# Patient Record
Sex: Male | Born: 1940 | Race: White | Hispanic: No | Marital: Married | Smoking: Former smoker
Health system: Southern US, Community
[De-identification: ages and names within clinical notes are randomized; demographics above are authoritative.]

## PROBLEM LIST (undated history)

## (undated) DIAGNOSIS — M1990 Unspecified osteoarthritis, unspecified site: Secondary | ICD-10-CM

## (undated) DIAGNOSIS — E785 Hyperlipidemia, unspecified: Secondary | ICD-10-CM

## (undated) DIAGNOSIS — K838 Other specified diseases of biliary tract: Secondary | ICD-10-CM

## (undated) DIAGNOSIS — E119 Type 2 diabetes mellitus without complications: Secondary | ICD-10-CM

## (undated) DIAGNOSIS — I10 Essential (primary) hypertension: Secondary | ICD-10-CM

## (undated) DIAGNOSIS — E538 Deficiency of other specified B group vitamins: Secondary | ICD-10-CM

## (undated) DIAGNOSIS — J189 Pneumonia, unspecified organism: Secondary | ICD-10-CM

## (undated) DIAGNOSIS — K219 Gastro-esophageal reflux disease without esophagitis: Secondary | ICD-10-CM

## (undated) DIAGNOSIS — K602 Anal fissure, unspecified: Secondary | ICD-10-CM

## (undated) DIAGNOSIS — K529 Noninfective gastroenteritis and colitis, unspecified: Secondary | ICD-10-CM

## (undated) DIAGNOSIS — D649 Anemia, unspecified: Secondary | ICD-10-CM

## (undated) DIAGNOSIS — C4490 Unspecified malignant neoplasm of skin, unspecified: Secondary | ICD-10-CM

## (undated) DIAGNOSIS — K5730 Diverticulosis of large intestine without perforation or abscess without bleeding: Secondary | ICD-10-CM

## (undated) DIAGNOSIS — N289 Disorder of kidney and ureter, unspecified: Secondary | ICD-10-CM

## (undated) DIAGNOSIS — Z86010 Personal history of colonic polyps: Secondary | ICD-10-CM

## (undated) HISTORY — DX: Anemia, unspecified: D64.9

## (undated) HISTORY — DX: Disorder of kidney and ureter, unspecified: N28.9

## (undated) HISTORY — DX: Noninfective gastroenteritis and colitis, unspecified: K52.9

## (undated) HISTORY — PX: CATARACT EXTRACTION: SUR2

## (undated) HISTORY — DX: Type 2 diabetes mellitus without complications: E11.9

## (undated) HISTORY — DX: Gastro-esophageal reflux disease without esophagitis: K21.9

## (undated) HISTORY — PX: HEMORROIDECTOMY: SUR656

## (undated) HISTORY — DX: Pneumonia, unspecified organism: J18.9

## (undated) HISTORY — DX: Personal history of colonic polyps: Z86.010

## (undated) HISTORY — PX: UPPER GASTROINTESTINAL ENDOSCOPY: SHX188

## (undated) HISTORY — DX: Deficiency of other specified B group vitamins: E53.8

## (undated) HISTORY — DX: Other specified diseases of biliary tract: K83.8

## (undated) HISTORY — DX: Diverticulosis of large intestine without perforation or abscess without bleeding: K57.30

## (undated) HISTORY — DX: Unspecified malignant neoplasm of skin, unspecified: C44.90

## (undated) HISTORY — DX: Hyperlipidemia, unspecified: E78.5

## (undated) HISTORY — PX: COLONOSCOPY W/ POLYPECTOMY: SHX1380

## (undated) HISTORY — DX: Essential (primary) hypertension: I10

## (undated) HISTORY — PX: OTHER SURGICAL HISTORY: SHX169

## (undated) HISTORY — DX: Unspecified osteoarthritis, unspecified site: M19.90

## (undated) HISTORY — DX: Anal fissure, unspecified: K60.2

---
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
## 1959-12-23 HISTORY — PX: TONSILLECTOMY: SUR1361

## 1998-12-22 DIAGNOSIS — Z860100 Personal history of colon polyps, unspecified: Secondary | ICD-10-CM

## 1998-12-22 HISTORY — DX: Personal history of colon polyps, unspecified: Z86.0100

## 1998-12-22 HISTORY — DX: Personal history of colonic polyps: Z86.010

## 1999-06-13 ENCOUNTER — Encounter (INDEPENDENT_AMBULATORY_CARE_PROVIDER_SITE_OTHER): Payer: Self-pay | Admitting: Specialist

## 1999-06-13 ENCOUNTER — Ambulatory Visit (HOSPITAL_COMMUNITY): Admission: RE | Admit: 1999-06-13 | Discharge: 1999-06-13 | Payer: Self-pay | Admitting: Gastroenterology

## 2005-01-09 ENCOUNTER — Ambulatory Visit: Payer: Self-pay | Admitting: Internal Medicine

## 2005-01-31 ENCOUNTER — Ambulatory Visit: Payer: Self-pay | Admitting: Internal Medicine

## 2005-03-28 ENCOUNTER — Ambulatory Visit: Payer: Self-pay | Admitting: Internal Medicine

## 2005-06-03 ENCOUNTER — Ambulatory Visit: Payer: Self-pay | Admitting: Internal Medicine

## 2005-07-28 ENCOUNTER — Ambulatory Visit: Payer: Self-pay | Admitting: Internal Medicine

## 2005-09-11 ENCOUNTER — Ambulatory Visit: Payer: Self-pay | Admitting: Internal Medicine

## 2005-09-25 ENCOUNTER — Ambulatory Visit: Payer: Self-pay | Admitting: Internal Medicine

## 2005-09-26 ENCOUNTER — Encounter: Admission: RE | Admit: 2005-09-26 | Discharge: 2005-12-25 | Payer: Self-pay | Admitting: Internal Medicine

## 2006-05-05 ENCOUNTER — Ambulatory Visit: Payer: Self-pay | Admitting: Internal Medicine

## 2006-05-14 ENCOUNTER — Ambulatory Visit: Payer: Self-pay | Admitting: Internal Medicine

## 2006-11-16 ENCOUNTER — Ambulatory Visit: Payer: Self-pay | Admitting: Internal Medicine

## 2006-11-16 LAB — CONVERTED CEMR LAB
ALT: 18 units/L (ref 0–40)
AST: 18 units/L (ref 0–37)
BUN: 13 mg/dL (ref 6–23)
Chol/HDL Ratio, serum: 5.1
Cholesterol: 168 mg/dL (ref 0–200)
Creatinine, Ser: 1.1 mg/dL (ref 0.4–1.5)
Creatinine,U: 77.8 mg/dL
HDL: 33 mg/dL — ABNORMAL LOW (ref >39.0)
Hgb A1c MFr Bld: 5.5 % (ref 4.6–6.0)
LDL Cholesterol: 98 mg/dL (ref 0–99)
Microalb Creat Ratio: 2.6 mg/g (ref 0.0–30.0)
Microalb, Ur: 0.2 mg/dL (ref 0.0–1.9)
Potassium: 4.1 meq/L (ref 3.5–5.1)
Triglyceride fasting, serum: 184 mg/dL — ABNORMAL HIGH (ref 0–149)
VLDL: 37 mg/dL (ref 0–40)

## 2007-02-24 ENCOUNTER — Ambulatory Visit: Payer: Self-pay | Admitting: Gastroenterology

## 2007-03-23 ENCOUNTER — Ambulatory Visit: Payer: Self-pay | Admitting: Gastroenterology

## 2007-07-13 ENCOUNTER — Ambulatory Visit: Payer: Self-pay | Admitting: Internal Medicine

## 2007-08-25 ENCOUNTER — Telehealth (INDEPENDENT_AMBULATORY_CARE_PROVIDER_SITE_OTHER): Payer: Self-pay | Admitting: *Deleted

## 2007-09-30 ENCOUNTER — Encounter: Payer: Self-pay | Admitting: Internal Medicine

## 2007-11-05 ENCOUNTER — Telehealth (INDEPENDENT_AMBULATORY_CARE_PROVIDER_SITE_OTHER): Payer: Self-pay | Admitting: *Deleted

## 2008-01-05 ENCOUNTER — Encounter: Payer: Self-pay | Admitting: Internal Medicine

## 2008-01-05 ENCOUNTER — Telehealth (INDEPENDENT_AMBULATORY_CARE_PROVIDER_SITE_OTHER): Payer: Self-pay | Admitting: *Deleted

## 2008-04-14 ENCOUNTER — Encounter: Payer: Self-pay | Admitting: Internal Medicine

## 2008-05-03 ENCOUNTER — Ambulatory Visit: Payer: Self-pay | Admitting: Internal Medicine

## 2008-05-03 DIAGNOSIS — Z860100 Personal history of colon polyps, unspecified: Secondary | ICD-10-CM | POA: Insufficient documentation

## 2008-05-03 DIAGNOSIS — N401 Enlarged prostate with lower urinary tract symptoms: Secondary | ICD-10-CM

## 2008-05-03 DIAGNOSIS — K5730 Diverticulosis of large intestine without perforation or abscess without bleeding: Secondary | ICD-10-CM

## 2008-05-03 DIAGNOSIS — R351 Nocturia: Secondary | ICD-10-CM

## 2008-05-03 DIAGNOSIS — E785 Hyperlipidemia, unspecified: Secondary | ICD-10-CM | POA: Insufficient documentation

## 2008-05-03 DIAGNOSIS — K219 Gastro-esophageal reflux disease without esophagitis: Secondary | ICD-10-CM

## 2008-05-03 DIAGNOSIS — I10 Essential (primary) hypertension: Secondary | ICD-10-CM

## 2008-05-03 LAB — CONVERTED CEMR LAB
ALT: 18 units/L (ref 0–53)
AST: 21 units/L (ref 0–37)
Albumin: 4.1 g/dL (ref 3.5–5.2)
Alkaline Phosphatase: 38 units/L — ABNORMAL LOW (ref 39–117)
BUN: 17 mg/dL (ref 6–23)
Basophils Absolute: 0 10*3/uL (ref 0.0–0.1)
Basophils Relative: 0.8 % (ref 0.0–1.0)
Bilirubin, Direct: 0.1 mg/dL (ref 0.0–0.3)
Cholesterol, target level: 200 mg/dL
Cholesterol: 141 mg/dL (ref 0–200)
Creatinine, Ser: 1.4 mg/dL (ref 0.4–1.5)
Creatinine,U: 93.7 mg/dL
Eosinophils Absolute: 0.2 10*3/uL (ref 0.0–0.7)
Eosinophils Relative: 5.2 % — ABNORMAL HIGH (ref 0.0–5.0)
HCT: 38.8 % — ABNORMAL LOW (ref 39.0–52.0)
HDL goal, serum: 40 mg/dL
HDL: 25.6 mg/dL — ABNORMAL LOW (ref >39.0)
Hemoglobin: 13 g/dL (ref 13.0–17.0)
Hgb A1c MFr Bld: 6 % (ref 4.6–6.0)
LDL Cholesterol: 95 mg/dL (ref 0–99)
LDL Goal: 100 mg/dL
Lymphocytes Relative: 37.4 % (ref 12.0–46.0)
MCHC: 33.7 g/dL (ref 30.0–36.0)
MCV: 91.3 fL (ref 78.0–100.0)
Microalb Creat Ratio: 3.2 mg/g (ref 0.0–30.0)
Microalb, Ur: 0.3 mg/dL (ref 0.0–1.9)
Monocytes Absolute: 0.3 10*3/uL (ref 0.1–1.0)
Monocytes Relative: 6.1 % (ref 3.0–12.0)
Neutro Abs: 2.3 10*3/uL (ref 1.4–7.7)
Neutrophils Relative %: 50.5 % (ref 43.0–77.0)
PSA: 0.82 ng/mL (ref 0.10–4.00)
Platelets: 244 10*3/uL (ref 150–400)
Potassium: 4.4 meq/L (ref 3.5–5.1)
RBC: 4.25 M/uL (ref 4.22–5.81)
RDW: 12.9 % (ref 11.5–14.6)
TSH: 2.46 microintl units/mL (ref 0.35–5.50)
Total Bilirubin: 0.8 mg/dL (ref 0.3–1.2)
Total CHOL/HDL Ratio: 5.5
Total Protein: 7.4 g/dL (ref 6.0–8.3)
Triglycerides: 100 mg/dL (ref 0–149)
VLDL: 20 mg/dL (ref 0–40)
WBC: 4.5 10*3/uL (ref 4.5–10.5)

## 2008-05-09 ENCOUNTER — Encounter: Payer: Self-pay | Admitting: Internal Medicine

## 2008-05-11 ENCOUNTER — Encounter (INDEPENDENT_AMBULATORY_CARE_PROVIDER_SITE_OTHER): Payer: Self-pay | Admitting: *Deleted

## 2008-05-19 ENCOUNTER — Ambulatory Visit: Payer: Self-pay | Admitting: Internal Medicine

## 2008-05-21 LAB — CONVERTED CEMR LAB
Basophils Absolute: 0 10*3/uL (ref 0.0–0.1)
Basophils Relative: 0.7 % (ref 0.0–1.0)
Eosinophils Absolute: 0.2 10*3/uL (ref 0.0–0.7)
Eosinophils Relative: 2.9 % (ref 0.0–5.0)
Folate: 14.6 ng/mL
HCT: 39.3 % (ref 39.0–52.0)
Hemoglobin: 13.3 g/dL (ref 13.0–17.0)
Iron: 88 ug/dL (ref 42–165)
Lymphocytes Relative: 29.2 % (ref 12.0–46.0)
MCHC: 33.7 g/dL (ref 30.0–36.0)
MCV: 90.2 fL (ref 78.0–100.0)
Monocytes Absolute: 0.4 10*3/uL (ref 0.1–1.0)
Monocytes Relative: 6.8 % (ref 3.0–12.0)
Neutro Abs: 3.9 10*3/uL (ref 1.4–7.7)
Neutrophils Relative %: 60.4 % (ref 43.0–77.0)
Platelets: 226 10*3/uL (ref 150–400)
RBC: 4.36 M/uL (ref 4.22–5.81)
RDW: 13 % (ref 11.5–14.6)
Saturation Ratios: 19.1 % — ABNORMAL LOW (ref 20.0–50.0)
Transferrin: 328.3 mg/dL (ref >=212.0)
Vitamin B-12: 191 pg/mL — ABNORMAL LOW (ref 211–911)
WBC: 6.3 10*3/uL (ref 4.5–10.5)

## 2008-05-22 ENCOUNTER — Encounter (INDEPENDENT_AMBULATORY_CARE_PROVIDER_SITE_OTHER): Payer: Self-pay | Admitting: *Deleted

## 2008-06-14 ENCOUNTER — Ambulatory Visit: Payer: Self-pay | Admitting: Internal Medicine

## 2008-06-15 ENCOUNTER — Encounter (INDEPENDENT_AMBULATORY_CARE_PROVIDER_SITE_OTHER): Payer: Self-pay | Admitting: *Deleted

## 2008-06-15 LAB — CONVERTED CEMR LAB
OCCULT 1: NEGATIVE
OCCULT 2: NEGATIVE
OCCULT 3: NEGATIVE

## 2008-06-20 ENCOUNTER — Ambulatory Visit: Payer: Self-pay | Admitting: Internal Medicine

## 2008-06-20 DIAGNOSIS — D510 Vitamin B12 deficiency anemia due to intrinsic factor deficiency: Secondary | ICD-10-CM

## 2008-06-20 DIAGNOSIS — J309 Allergic rhinitis, unspecified: Secondary | ICD-10-CM | POA: Insufficient documentation

## 2008-06-27 ENCOUNTER — Ambulatory Visit: Payer: Self-pay | Admitting: Internal Medicine

## 2008-07-04 ENCOUNTER — Ambulatory Visit: Payer: Self-pay | Admitting: Internal Medicine

## 2008-07-04 ENCOUNTER — Telehealth (INDEPENDENT_AMBULATORY_CARE_PROVIDER_SITE_OTHER): Payer: Self-pay | Admitting: *Deleted

## 2008-07-11 ENCOUNTER — Telehealth (INDEPENDENT_AMBULATORY_CARE_PROVIDER_SITE_OTHER): Payer: Self-pay | Admitting: *Deleted

## 2008-07-11 ENCOUNTER — Ambulatory Visit: Payer: Self-pay | Admitting: Internal Medicine

## 2008-08-03 ENCOUNTER — Telehealth (INDEPENDENT_AMBULATORY_CARE_PROVIDER_SITE_OTHER): Payer: Self-pay | Admitting: *Deleted

## 2008-08-08 ENCOUNTER — Ambulatory Visit: Payer: Self-pay | Admitting: Internal Medicine

## 2008-08-14 ENCOUNTER — Encounter (INDEPENDENT_AMBULATORY_CARE_PROVIDER_SITE_OTHER): Payer: Self-pay | Admitting: *Deleted

## 2008-08-14 LAB — CONVERTED CEMR LAB: Hgb A1c MFr Bld: 6 % (ref 4.6–6.0)

## 2008-09-05 ENCOUNTER — Ambulatory Visit: Payer: Self-pay | Admitting: Internal Medicine

## 2008-09-05 ENCOUNTER — Telehealth (INDEPENDENT_AMBULATORY_CARE_PROVIDER_SITE_OTHER): Payer: Self-pay | Admitting: *Deleted

## 2008-09-07 ENCOUNTER — Ambulatory Visit: Payer: Self-pay | Admitting: Internal Medicine

## 2008-09-10 LAB — CONVERTED CEMR LAB
BUN: 19 mg/dL (ref 6–23)
Creatinine, Ser: 1.5 mg/dL (ref 0.4–1.5)
Creatinine,U: 112.8 mg/dL
Hgb A1c MFr Bld: 6.1 % — ABNORMAL HIGH (ref 4.6–6.0)
Microalb Creat Ratio: 1.8 mg/g (ref 0.0–30.0)
Microalb, Ur: 0.2 mg/dL (ref 0.0–1.9)

## 2008-09-11 ENCOUNTER — Encounter (INDEPENDENT_AMBULATORY_CARE_PROVIDER_SITE_OTHER): Payer: Self-pay | Admitting: *Deleted

## 2008-09-15 ENCOUNTER — Ambulatory Visit: Payer: Self-pay | Admitting: Internal Medicine

## 2008-09-15 DIAGNOSIS — E1165 Type 2 diabetes mellitus with hyperglycemia: Secondary | ICD-10-CM

## 2008-09-15 DIAGNOSIS — E1129 Type 2 diabetes mellitus with other diabetic kidney complication: Secondary | ICD-10-CM

## 2008-10-05 ENCOUNTER — Ambulatory Visit: Payer: Self-pay | Admitting: Internal Medicine

## 2008-11-06 ENCOUNTER — Ambulatory Visit: Payer: Self-pay | Admitting: Internal Medicine

## 2008-12-26 ENCOUNTER — Ambulatory Visit: Payer: Self-pay | Admitting: Internal Medicine

## 2008-12-26 LAB — CONVERTED CEMR LAB
Basophils Absolute: 0 10*3/uL (ref 0.0–0.1)
Hemoglobin: 12.8 g/dL — ABNORMAL LOW (ref 13.0–17.0)
Lymphocytes Relative: 37.5 % (ref 12.0–46.0)
MCHC: 34.4 g/dL (ref 30.0–36.0)
Monocytes Relative: 6.8 % (ref 3.0–12.0)
Neutro Abs: 2.3 10*3/uL (ref 1.4–7.7)
Neutrophils Relative %: 50.7 % (ref 43.0–77.0)
Platelets: 204 10*3/uL (ref 150–400)
RDW: 13 % (ref 11.5–14.6)
Vitamin B-12: 277 pg/mL (ref 211–911)

## 2009-01-02 ENCOUNTER — Ambulatory Visit: Payer: Self-pay | Admitting: Internal Medicine

## 2009-01-03 ENCOUNTER — Encounter (INDEPENDENT_AMBULATORY_CARE_PROVIDER_SITE_OTHER): Payer: Self-pay | Admitting: *Deleted

## 2009-01-03 LAB — CONVERTED CEMR LAB
Iron: 73 ug/dL (ref 42–165)
Saturation Ratios: 17 % — ABNORMAL LOW (ref 20.0–50.0)

## 2009-02-02 ENCOUNTER — Ambulatory Visit: Payer: Self-pay | Admitting: Internal Medicine

## 2009-02-27 ENCOUNTER — Ambulatory Visit: Payer: Self-pay | Admitting: Internal Medicine

## 2009-03-04 LAB — CONVERTED CEMR LAB
BUN: 22 mg/dL (ref 6–23)
Creatinine,U: 109.1 mg/dL
Eosinophils Absolute: 0.2 10*3/uL (ref 0.0–0.7)
HCT: 40.2 % (ref 39.0–52.0)
Microalb Creat Ratio: 1.8 mg/g (ref 0.0–30.0)
Microalb, Ur: 0.2 mg/dL (ref 0.0–1.9)
Monocytes Absolute: 0.3 10*3/uL (ref 0.1–1.0)
Monocytes Relative: 6.4 % (ref 3.0–12.0)
Neutrophils Relative %: 53.7 % (ref 43.0–77.0)
Platelets: 230 10*3/uL (ref 150–400)
RDW: 13 % (ref 11.5–14.6)
WBC: 4.5 10*3/uL (ref 4.5–10.5)

## 2009-03-05 ENCOUNTER — Encounter (INDEPENDENT_AMBULATORY_CARE_PROVIDER_SITE_OTHER): Payer: Self-pay | Admitting: *Deleted

## 2009-03-28 ENCOUNTER — Telehealth (INDEPENDENT_AMBULATORY_CARE_PROVIDER_SITE_OTHER): Payer: Self-pay | Admitting: *Deleted

## 2009-04-02 ENCOUNTER — Ambulatory Visit: Payer: Self-pay | Admitting: Internal Medicine

## 2009-04-30 ENCOUNTER — Ambulatory Visit: Payer: Self-pay | Admitting: Internal Medicine

## 2009-05-28 ENCOUNTER — Ambulatory Visit: Payer: Self-pay | Admitting: Internal Medicine

## 2009-06-26 ENCOUNTER — Ambulatory Visit: Payer: Self-pay | Admitting: Internal Medicine

## 2009-07-24 ENCOUNTER — Ambulatory Visit: Payer: Self-pay | Admitting: Internal Medicine

## 2009-08-01 ENCOUNTER — Telehealth (INDEPENDENT_AMBULATORY_CARE_PROVIDER_SITE_OTHER): Payer: Self-pay | Admitting: *Deleted

## 2009-08-21 ENCOUNTER — Ambulatory Visit: Payer: Self-pay | Admitting: Internal Medicine

## 2009-09-20 ENCOUNTER — Telehealth (INDEPENDENT_AMBULATORY_CARE_PROVIDER_SITE_OTHER): Payer: Self-pay | Admitting: *Deleted

## 2009-09-20 ENCOUNTER — Ambulatory Visit: Payer: Self-pay | Admitting: Internal Medicine

## 2009-09-25 ENCOUNTER — Encounter: Payer: Self-pay | Admitting: Internal Medicine

## 2009-09-26 ENCOUNTER — Encounter: Payer: Self-pay | Admitting: Internal Medicine

## 2009-10-18 ENCOUNTER — Ambulatory Visit: Payer: Self-pay | Admitting: Internal Medicine

## 2009-11-19 ENCOUNTER — Ambulatory Visit: Payer: Self-pay | Admitting: Internal Medicine

## 2009-11-19 ENCOUNTER — Telehealth (INDEPENDENT_AMBULATORY_CARE_PROVIDER_SITE_OTHER): Payer: Self-pay | Admitting: *Deleted

## 2009-11-28 ENCOUNTER — Telehealth (INDEPENDENT_AMBULATORY_CARE_PROVIDER_SITE_OTHER): Payer: Self-pay | Admitting: *Deleted

## 2009-12-12 ENCOUNTER — Ambulatory Visit: Payer: Self-pay | Admitting: Internal Medicine

## 2009-12-17 ENCOUNTER — Encounter (INDEPENDENT_AMBULATORY_CARE_PROVIDER_SITE_OTHER): Payer: Self-pay | Admitting: *Deleted

## 2009-12-17 LAB — CONVERTED CEMR LAB
ALT: 22 units/L (ref 0–53)
AST: 26 units/L (ref 0–37)
Albumin: 4.2 g/dL (ref 3.5–5.2)
BUN: 18 mg/dL (ref 6–23)
Cholesterol: 152 mg/dL (ref 0–200)
HDL: 24.6 mg/dL — ABNORMAL LOW (ref >39.00)
Microalb, Ur: 0.4 mg/dL (ref 0.0–1.9)
Total CHOL/HDL Ratio: 6
Total Protein: 7.6 g/dL (ref 6.0–8.3)
VLDL: 42.6 mg/dL — ABNORMAL HIGH (ref 0.0–40.0)

## 2009-12-22 DIAGNOSIS — K529 Noninfective gastroenteritis and colitis, unspecified: Secondary | ICD-10-CM

## 2009-12-22 HISTORY — DX: Noninfective gastroenteritis and colitis, unspecified: K52.9

## 2010-01-02 ENCOUNTER — Ambulatory Visit: Payer: Self-pay | Admitting: Internal Medicine

## 2010-01-02 DIAGNOSIS — M2550 Pain in unspecified joint: Secondary | ICD-10-CM | POA: Insufficient documentation

## 2010-01-28 ENCOUNTER — Ambulatory Visit: Payer: Self-pay | Admitting: Internal Medicine

## 2010-02-25 ENCOUNTER — Ambulatory Visit: Payer: Self-pay | Admitting: Internal Medicine

## 2010-04-05 ENCOUNTER — Ambulatory Visit: Payer: Self-pay | Admitting: Internal Medicine

## 2010-04-08 ENCOUNTER — Ambulatory Visit: Payer: Self-pay | Admitting: Internal Medicine

## 2010-04-08 DIAGNOSIS — M542 Cervicalgia: Secondary | ICD-10-CM | POA: Insufficient documentation

## 2010-04-09 LAB — CONVERTED CEMR LAB
Rhuematoid fact SerPl-aCnc: 21.4 intl units/mL — ABNORMAL HIGH (ref 0.0–20.0)
Sed Rate: 14 mm/hr (ref 0–22)

## 2010-05-08 ENCOUNTER — Ambulatory Visit: Payer: Self-pay | Admitting: Internal Medicine

## 2010-05-15 ENCOUNTER — Encounter: Payer: Self-pay | Admitting: Internal Medicine

## 2010-06-25 ENCOUNTER — Ambulatory Visit: Payer: Self-pay | Admitting: Internal Medicine

## 2010-06-25 LAB — CONVERTED CEMR LAB
AST: 35 units/L (ref 0–37)
Albumin: 4.4 g/dL (ref 3.5–5.2)
BUN: 23 mg/dL (ref 6–23)
Basophils Relative: 0.5 % (ref 0.0–3.0)
Cholesterol: 167 mg/dL (ref 0–200)
Creatinine, Ser: 1.4 mg/dL (ref 0.4–1.5)
Eosinophils Absolute: 0.2 10*3/uL (ref 0.0–0.7)
HDL: 32.1 mg/dL — ABNORMAL LOW (ref >39.00)
Hgb A1c MFr Bld: 6.4 % (ref 4.6–6.5)
MCHC: 34.3 g/dL (ref 30.0–36.0)
MCV: 89.5 fL (ref 78.0–100.0)
Monocytes Absolute: 0.3 10*3/uL (ref 0.1–1.0)
Neutrophils Relative %: 50.7 % (ref 43.0–77.0)
Potassium: 4.9 meq/L (ref 3.5–5.1)
RBC: 4.25 M/uL (ref 4.22–5.81)
Triglycerides: 162 mg/dL — ABNORMAL HIGH (ref 0.0–149.0)
VLDL: 32.4 mg/dL (ref 0.0–40.0)

## 2010-07-02 ENCOUNTER — Ambulatory Visit: Payer: Self-pay | Admitting: Internal Medicine

## 2010-07-02 DIAGNOSIS — M25469 Effusion, unspecified knee: Secondary | ICD-10-CM

## 2010-07-02 DIAGNOSIS — D518 Other vitamin B12 deficiency anemias: Secondary | ICD-10-CM

## 2010-08-02 ENCOUNTER — Ambulatory Visit: Payer: Self-pay | Admitting: Internal Medicine

## 2010-08-30 ENCOUNTER — Ambulatory Visit: Payer: Self-pay | Admitting: Internal Medicine

## 2010-10-07 ENCOUNTER — Ambulatory Visit: Payer: Self-pay | Admitting: Internal Medicine

## 2010-11-12 ENCOUNTER — Ambulatory Visit: Payer: Self-pay | Admitting: Internal Medicine

## 2010-12-03 ENCOUNTER — Ambulatory Visit: Payer: Self-pay | Admitting: Internal Medicine

## 2010-12-26 ENCOUNTER — Ambulatory Visit
Admission: RE | Admit: 2010-12-26 | Discharge: 2010-12-26 | Payer: Self-pay | Source: Home / Self Care | Attending: Internal Medicine | Admitting: Internal Medicine

## 2010-12-26 ENCOUNTER — Other Ambulatory Visit: Payer: Self-pay | Admitting: Internal Medicine

## 2010-12-26 LAB — LIPID PANEL
Cholesterol: 150 mg/dL (ref 0–200)
HDL: 25.8 mg/dL — ABNORMAL LOW (ref >39.00)
LDL Cholesterol: 88 mg/dL (ref 0–99)
Total CHOL/HDL Ratio: 6
Triglycerides: 180 mg/dL — ABNORMAL HIGH (ref 0.0–149.0)
VLDL: 36 mg/dL (ref 0.0–40.0)

## 2010-12-26 LAB — BASIC METABOLIC PANEL
BUN: 19 mg/dL (ref 6–23)
CO2: 26 mEq/L (ref 19–32)
Calcium: 9.3 mg/dL (ref 8.4–10.5)
Chloride: 100 mEq/L (ref 96–112)
Creatinine, Ser: 1.6 mg/dL — ABNORMAL HIGH (ref 0.4–1.5)
GFR: 44.76 mL/min — ABNORMAL LOW (ref >60.00)
Glucose, Bld: 128 mg/dL — ABNORMAL HIGH (ref 70–99)
Potassium: 4.6 mEq/L (ref 3.5–5.1)
Sodium: 135 mEq/L (ref 135–145)

## 2010-12-26 LAB — HEMOGLOBIN A1C: Hgb A1c MFr Bld: 6.5 % (ref 4.6–6.5)

## 2011-01-02 ENCOUNTER — Ambulatory Visit
Admission: RE | Admit: 2011-01-02 | Discharge: 2011-01-02 | Payer: Self-pay | Source: Home / Self Care | Attending: Internal Medicine | Admitting: Internal Medicine

## 2011-01-02 DIAGNOSIS — N289 Disorder of kidney and ureter, unspecified: Secondary | ICD-10-CM | POA: Insufficient documentation

## 2011-01-19 LAB — CONVERTED CEMR LAB
ALT: 21 units/L (ref 0–53)
AST: 27 units/L (ref 0–37)
BUN: 17 mg/dL (ref 6–23)
Cholesterol: 161 mg/dL (ref 0–200)
Creatinine, Ser: 1.4 mg/dL (ref 0.4–1.5)
Creatinine,U: 81.5 mg/dL
HDL: 29 mg/dL — ABNORMAL LOW (ref >39.0)
Hgb A1c MFr Bld: 5.9 % (ref 4.6–6.0)
LDL Cholesterol: 100 mg/dL — ABNORMAL HIGH (ref 0–99)
Microalb Creat Ratio: 2.5 mg/g (ref 0.0–30.0)
Microalb, Ur: 0.2 mg/dL (ref 0.0–1.9)
PSA: 0.85 ng/mL (ref 0.10–4.00)
Potassium: 4.1 meq/L (ref 3.5–5.1)
Total CHOL/HDL Ratio: 5.6
Triglycerides: 161 mg/dL — ABNORMAL HIGH (ref 0–149)
VLDL: 32 mg/dL (ref 0–40)

## 2011-01-21 NOTE — Assessment & Plan Note (Signed)
Summary: 6 MTH FU/NS/KDC   Vital Signs:  Patient profile:   70 year old male Weight:      217.2 pounds Pulse rate:   72 / minute Resp:     16 per minute BP sitting:   112 / 76  (left arm) Cuff size:   large  Vitals Entered By: Shonna Chock CMA (July 02, 2010 3:15 PM) CC: 6 month follow-up and right knee concerns x 6 weeks, Lower Extremity Joint pain, Type 2 diabetes mellitus follow-up Comments REVIEWED MED LIST, PATIENT AGREED DOSE AND INSTRUCTION CORRECT    CC:  6 month follow-up and right knee concerns x 6 weeks, Lower Extremity Joint pain, and Type 2 diabetes mellitus follow-up.  History of Present Illness:  Lower Extremity Joint Pain      This is a 70 year old man who presents with Lower Extremity Joint pain X 6 weeks.  The patient reports swelling, giving away, popping, stiffness for >1 hr, and decreased ROM, but denies redness, locking, and weakness.  The pain is located in the right knee.  The pain began suddenly and with twisting while working in basement.  The pain is described as sharp initially then  dull  and aching.  To date  no evaluation.  The patient denies the following symptoms: fever, rash, eye symptoms, diarrhea, and dysuria.  Rx: Tylenol or Aleve with some benefit. Type 2 Diabetes Mellitus Follow-Up      The patient is also here for Type 2 diabetes mellitus follow-up.  The patient denies polyuria, polydipsia, blurred vision, self managed hypoglycemia, weight loss, weight gain, and numbness of extremities.  The patient denies the following symptoms: neuropathic pain, chest pain, vomiting, orthostatic symptoms, poor wound healing, intermittent claudication, vision loss, and foot ulcer.  Since the last visit the patient reports  fair  dietary compliance as wife OOT & he has been eating out. Good  compliance with medications,  but not exercising regularly, and not monitoring blood glucose. He  reports having  regular  eye care by an ophthalmologist but  no foot care.     Allergies: 1)  ! * Tetanus  Physical Exam  General:  well-nourished,in no acute distress; alert,appropriate and cooperative throughout examination Lungs:  Normal respiratory effort, chest expands symmetrically. Lungs are clear to auscultation, no crackles or wheezes. Heart:  normal rate, regular rhythm, no gallop, no rub, no JVD, no HJR, and grade 1 /6 systolic murmur.   Pulses:  R and L carotid,radial,dorsalis pedis and posterior tibial pulses are full and equal bilaterally Extremities:  No clubbing, cyanosis, edema. Large effusion R knee with decreased ROM. Good nail health. Pes planus Neurologic:  alert & oriented X3, strength normal in all extremities, and DTRs symmetrical and normal.   Skin:  Intact without suspicious lesions or rashes Psych:  memory intact for recent and remote, normally interactive, and good eye contact.     Impression & Recommendations:  Problem # 1:  DIABETES MELLITUS, WITHOUT COMPLICATIONS (ICD-250.00)  His updated medication list for this problem includes:    Amaryl 2 Mg Tabs (Glimepiride) .Marland Kitchen... 1/2 tab qd    Aspirin 81 Mg Tabs (Aspirin) .Marland Kitchen... 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 1 by mouth qam  Problem # 4:  HYPERLIPIDEMIA (ICD-272.4) essentially goal His updated medication list  for this problem includes:    Fenofibrate 160 Mg Tabs (Fenofibrate) .Marland Kitchen... 1 by mouth once daily  Problem # 5:  JOINT EFFUSION, RIGHT KNEE (ICD-719.06)  Orders: Orthopedic Surgeon Referral (Ortho Surgeon)  Complete Medication List: 1)  Atenolol 25 Mg Tabs (Atenolol) .Marland Kitchen.. 1 by mouth qam 2)  Metformin Hcl 1000 Mg Tabs (metformin Hcl)  .Marland Kitchen.. 1 by mouth two times a day 3)  Amaryl 2 Mg Tabs (Glimepiride) .... 1/2 tab qd 4)  Fenofibrate 160 Mg Tabs (Fenofibrate) .Marland Kitchen.. 1 by mouth  once daily 5)  Prilosec Otc 20 Mg Tbec (Omeprazole magnesium) .Marland Kitchen.. 1 by mouth qd 6)  Basa  7)  Cvs Omeprazole 20 Mg Tbec (Omeprazole) .Marland Kitchen.. 1 once daily as needed 8)  Accucheck Compact Plus Strips and Lancets  .... Tests one time daily 9)  Aspirin 81 Mg Tabs (Aspirin) .Marland Kitchen.. 1 by mouth once daily 10)  Aleve 220 Mg Tabs (Naproxen sodium) .Marland Kitchen.. 1 by mouth once daily as needed 11)  Tylenol Extra Strength 500 Mg Tabs (Acetaminophen) .Marland Kitchen.. 1 by mouth once daily as needed 12)  Tramadol Hcl 50 Mg Tabs (Tramadol hcl) .Marland Kitchen.. 1 q 6 hrs as needed neck pain  Other Orders: Vit B12 1000 mcg (J3420) Admin of Therapeutic Inj  intramuscular or subcutaneous (60454)  Patient Instructions: 1)  Consume < 40 grams  of High Fructose Corn  Syrup sugar/ day.Use Celebrex samples  two times a day as needed for knee pain. 2)  Please schedule a follow-up appointment in 6 months. 3)  BMP prior to visit, ICD-9:401.9 4)  Lipid Panel prior to visit, ICD-9:272.4 5)  HbgA1C prior to visit, ICD-9:250.00.   Medication Administration  Injection # 1:    Medication: Vit B12 1000 mcg    Diagnosis: ANEMIA, B12 DEFICIENCY (ICD-281.1)    Route: IM    Site: L deltoid    Exp Date: 02/2011    Lot #: [id number]    Mfr: [employer] Regent    Patient tolerated injection without complications    Given by: Shonna Chock CMA (July 02, 2010 4:25 PM)  Orders Added: 1)  Est. Patient Level IV [09811] 2)  Vit B12 1000 mcg [J3420] 3)  Admin of Therapeutic Inj  intramuscular or subcutaneous [96372] 4)  Orthopedic Surgeon Referral [Ortho Surgeon]

## 2011-01-21 NOTE — Assessment & Plan Note (Signed)
Summary: B-12 SHOT ////SPH  Nurse Visit   Allergies: 1)  ! 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

## 2011-01-21 NOTE — Assessment & Plan Note (Signed)
Summary: b12 inj///lch  Nurse Visit   Allergies: 1)  ! * Tetanus  Medication Administration  Injection # 1:    Medication: Vit B12 1000 mcg    Diagnosis: ANEMIA, B12 DEFICIENCY (ICD-281.1)    Route: IM    Site: L deltoid    Exp Date: 02/2012    Lot #: [id number]    Mfr: [employer] Regent    Patient tolerated injection without complications    Given by: Shonna Chock CMA (August 02, 2010 2:53 PM)  Orders Added: 1)  Vit B12 1000 mcg [J3420] 2)  Admin of Therapeutic Inj  intramuscular or subcutaneous [09811]

## 2011-01-21 NOTE — Assessment & Plan Note (Signed)
Summary: neck & back pain/cbs   Vital Signs:  Patient profile:   70 year old male Weight:      225.4 pounds Temp:     97.8 degrees F oral Pulse rate:   72 / minute Resp:     15 per minute BP sitting:   130 / 72  (left arm) Cuff size:   large  Vitals Entered By: Shonna Chock (April 08, 2010 9:43 AM) CC: Neck and back pain since last year. ? if related to hitting head on Limb. Patient seen Dr.Stern x 1 last year and had xray was told arthritis and was told next step MRI (never persued ), Neck pain Comments REVIEWED MED LIST, PATIENT AGREED DOSE AND INSTRUCTION CORRECT    CC:  Neck and back pain since last year. ? if related to hitting head on Limb. Patient seen Dr.Stern x 1 last year and had xray was told arthritis and was told next step MRI (never persued ) and Neck pain.  History of Present Illness: He is concerned that PPI is affecting B12 metabolism as per [facility] article.This was not documented in [facility] review.His major health issue is neck pain , ? related to tree limb injury in early 2010. 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 The patient reports midline- left  neck pain.  Associated symptoms include locking, clicking, and impaired neck ROM.  The patient denies the following associated symptoms: numbness, weakness, impaired coordination, gait disturbance, tingling/parasthesias, fever, bladder dysfunction, and bowel dysfunction.  The pain is described as stabbing and intermittent.  The pain is better with rest.  History is significant for FH of arthritis.  Evaluation to date has included X-rays of neck.    Allergies: 1)  ! * Tetanus  Review of Systems General:  Denies weight loss. GU:  Denies incontinence. Derm:  Denies lesion(s) and rash. Neuro:  Denies brief paralysis and weakness. Heme:  Denies abnormal bruising and bleeding.  Physical Exam  General:  well-nourished; alert,appropriate and cooperative throughout examination Head:  Normocephalic and  atraumatic without obvious abnormalities.  Neck:  No deformities, masses, or tenderness noted.Some pain with neck ROM Lungs:  Normal respiratory effort, chest expands symmetrically. Lungs are clear to auscultation, no crackles or wheezes. Heart:  normal rate, regular rhythm, no gallop, no rub, no JVD, no HJR, and grade 1 /6 systolic murmur.   Pulses:  R and L carotid pulses are full and equal bilaterally w/o bruits Extremities:  No clubbing, cyanosis, edema, or deformity noted with normal full range of motion of all joints.  Crepitus of knees , L >R Neurologic:  alert & oriented X3, strength normal in all extremities, and DTRs symmetrical and normal.   Skin:  Intact without suspicious lesions or rashes Cervical Nodes:  No lymphadenopathy noted Axillary Nodes:  No palpable lymphadenopathy Psych:  memory intact for recent and remote, normally interactive, and good eye contact.     Impression & Recommendations:  Problem # 1:  CERVICALGIA (ICD-723.1)  His updated medication list for this problem includes:    Aspirin 81 Mg Tabs (Aspirin) .Marland Kitchen... 1 by mouth once daily    Aleve 220 Mg Tabs (Naproxen sodium) .Marland Kitchen... 1 by mouth once daily as needed    Tylophen 325 Mg Tabs (Acetaminophen) .Marland Kitchen... 1 by mouth once daily as needed    Tramadol Hcl 50 Mg Tabs (Tramadol hcl) .Marland Kitchen... 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 1 by mouth qam 2)  Metformin Hcl 1000 Mg Tabs (metformin Hcl)  .Marland Kitchen.. 1 by mouth two times  a day 3)  Amaryl 2 Mg Tabs (Glimepiride) .... 1/2 tab qd 4)  Fenofibrate 160 Mg Tabs (Fenofibrate) .Marland Kitchen.. 1 by mouth once daily 5)  Prilosec Otc 20 Mg Tbec (Omeprazole magnesium) .Marland Kitchen.. 1 by mouth qd 6)  Basa  7)  Cvs Omeprazole 20 Mg Tbec (Omeprazole) .Marland Kitchen.. 1 once daily as needed 8)  Accucheck Compact Plus Strips and Lancets  .... Tests one time daily 9)  Aspirin 81 Mg Tabs (Aspirin) .Marland Kitchen.. 1 by mouth once daily 10)  Aleve 220 Mg Tabs (Naproxen sodium) .Marland Kitchen.. 1 by mouth once daily as needed 11)  Tylophen 325 Mg Tabs (Acetaminophen) .Marland Kitchen.. 1 by mouth once daily as needed 12)  Tramadol Hcl 50 Mg Tabs (Tramadol hcl) .Marland Kitchen.. 1 q 6 hrs as needed neck pain  Patient Instructions: 1)  Trial of Glucosamine- Condroitin 3 months ,then 2 months off .

## 2011-01-21 NOTE — Assessment & Plan Note (Signed)
Summary: b12 inj//lch  Nurse Visit   Allergies: 1)  ! 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

## 2011-01-21 NOTE — Assessment & Plan Note (Signed)
Summary: b-12/cbs  Nurse Visit   Allergies: 1)  ! * Tetanus  Medication Administration  Injection # 1:    Medication: Vit B12 1000 mcg    Diagnosis: ANEMIA, B12 DEFICIENCY (ICD-281.1)    Route: IM    Site: R deltoid    Exp Date: 02/2012    Lot #: [id number]    Mfr: [employer] Regent    Patient tolerated injection without complications    Given by: Shonna Chock CMA (August 30, 2010 3:30 PM)  Orders Added: 1)  Vit B12 1000 mcg [J3420] 2)  Admin of Therapeutic Inj  intramuscular or subcutaneous [87564]

## 2011-01-21 NOTE — Assessment & Plan Note (Signed)
Summary: B12/KN  Nurse Visit  CC: Vitamin B-12 inj./kb   Allergies: 1)  ! * Tetanus  Immunizations Administered:  Pneumonia Vaccine:    Vaccine Type: Pneumovax    Site: left deltoid    Mfr: Merck    Dose: 0.5 ml    Route: IM    Given by: Lucious Groves CMA    Exp. Date: 04/22/2012    Lot #: [id number]    VIS given: 11/26/09 version given November 12, 2010.  Medication Administration  Injection # 1:    Medication: Vit B12 1000 mcg    Diagnosis: ANEMIA, B12 DEFICIENCY (ICD-281.1)    Route: IM    Site: R deltoid    Exp Date: 02/20/2012    Lot #: [id number]    Mfr: [employer] Regent    Patient tolerated injection without complications    Given by: Lucious Groves CMA (November 12, 2010 2:34 PM)  Orders Added: 1)  Vit B12 1000 mcg [J3420] 2)  Admin of Therapeutic Inj  intramuscular or subcutaneous [96372] 3)  Pneumococcal Vaccine [90732] 4)  Admin 1st Vaccine [06301]

## 2011-01-21 NOTE — Letter (Signed)
Summary: [facility]   Imported By: Lanelle Bal 05/25/2010 10:04:15  _____________________________________________________________________  External Attachment:    Type:   Image     Comment:   External Document

## 2011-01-21 NOTE — Assessment & Plan Note (Signed)
Summary: B-12/CBS  Nurse Visit  CC: B-12 and flu shot./kb   Allergies: 1)  ! * Tetanus  Medication Administration  Injection # 1:    Medication: Vit B12 1000 mcg    Diagnosis: ANEMIA, B12 DEFICIENCY (ICD-281.1)    Route: IM    Site: R deltoid    Exp Date: 02/20/2012    Lot #: [id number]    Mfr: [employer] Regent    Patient tolerated injection without complications    Given by: Lucious Groves CMA (October 07, 2010 2:56 PM)  Orders Added: 1)  Flu Vaccine 42yrs + MEDICARE PATIENTS [Q2039] 2)  Administration Flu vaccine - MCR [G0008] 3)  Vit B12 1000 mcg [J3420] 4)  Admin of Therapeutic Inj  intramuscular or subcutaneous [96372]          Flu Vaccine Consent Questions     Do you have a history of severe allergic reactions to this vaccine? no    Any prior history of allergic reactions to egg and/or gelatin? no    Do you have a sensitivity to the preservative Thimersol? no    Do you have a past history of Guillan-Barre Syndrome? no    Do you currently have an acute febrile illness? no    Have you ever had a severe reaction to latex? no    Vaccine information given and explained to patient? yes    Are you currently pregnant? no    Lot Number:[id number]   Exp Date:06/21/2011   Site Given  Left Deltoid IMu

## 2011-01-21 NOTE — Assessment & Plan Note (Signed)
Summary: FOLLOW UP ON LABS AND OTHER//PH   Vital Signs:  Patient profile:   70 year old male Height:      70.75 inches Weight:      215.6 pounds BMI:     30.39 Pulse rate:   72 / minute Resp:     14 per minute BP sitting:   122 / 78  (left arm) Cuff size:   large  Vitals Entered By: Shonna Chock (January 02, 2010 11:02 AM) CC: Follow-up on labs and refill meds Comments REVIEWED MED LIST, PATIENT AGREED DOSE AND INSTRUCTION CORRECT    CC:  Follow-up on labs and refill meds.  History of Present Illness: Labs reviewed & risks discussed: TG 213 , HDL 24.6, VLDL 42.6, LDL 97.4. 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 FBS & 2 hr post meal  not checked.Weight stable, ? down 1#. ? some  hypoglycemia when he skips lunch occasionally.  Allergies: 1)  ! * Tetanus  Review of Systems General:  Denies fatigue and weight loss. Eyes:  Denies blurring, double vision, and vision loss-both eyes; Last exam Fall 2010; no retinopathy. CV:  Denies chest pain or discomfort, leg cramps with exertion, lightheadness, and near fainting. MS:  Complains of joint pain; denies joint redness and joint swelling; Pain & popping in neck ; "arthritis " as per Xrays by Dr Venetia Maxon. Derm:  Denies poor wound healing. Neuro:  Denies numbness and tingling; No burning. Endo:  Denies excessive hunger, excessive thirst, and excessive urination.  Physical Exam  General:  Appears younger than age,well-nourished,in no acute distress; alert,appropriate and cooperative throughout examination Neck:  No deformities, masses, or tenderness noted. Full ROM Lungs:  Normal respiratory effort, chest expands symmetrically. Lungs are clear to auscultation, no crackles or wheezes. Heart:  normal rate, regular rhythm, no gallop, no rub, no JVD, no HJR, and grade 1/2-1  /6 systolic murmur.   Abdomen:  Bowel sounds positive,abdomen soft and non-tender without masses, organomegaly. Ventral  hernia   noted. Pulses:  R and L carotid,radial,dorsalis pedis and posterior tibial pulses are full and equal bilaterally Extremities:  No clubbing, cyanosis, edema. Pes planus Neurologic:  alert & oriented X3 and DTRs symmetrical but decreased @ knees Skin:  Intact without suspicious lesions or rashes Psych:  memory intact for recent and remote, normally interactive, and good eye contact.     Impression & Recommendations:  Problem # 1:  DIABETES MELLITUS, WITHOUT COMPLICATIONS (ICD-250.00)  His updated medication list for this problem includes:    Amaryl 2 Mg Tabs (Glimepiride) .Marland Kitchen... 1/2 tab qd    Aspirin 81 Mg Tabs (Aspirin) .Marland Kitchen... 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 1 by mouth qam  Problem # 5:  HYPERLIPIDEMIA (ICD-272.4)  His updated medication list for this problem includes:    Fenofibrate 160 Mg Tabs (Fenofibrate) .Marland Kitchen... 1 by mouth once daily  Complete Medication List: 1)  Atenolol 25 Mg Tabs (Atenolol) .Marland Kitchen.. 1 by mouth qam 2)  Metformin Hcl 1000 Mg Tabs (metformin Hcl)  .Marland Kitchen.. 1 by mouth two times a day 3)  Amaryl 2 Mg Tabs (Glimepiride) .... 1/2 tab qd 4)  Fenofibrate 160 Mg Tabs (Fenofibrate) .Marland Kitchen.. 1 by mouth once daily 5)  Prilosec Otc 20  Mg Tbec (Omeprazole magnesium) .Marland Kitchen.. 1 by mouth qd 6)  Basa  7)  Cvs Omeprazole 20 Mg Tbec (Omeprazole) .Marland Kitchen.. 1 once daily as needed 8)  Accucheck Compact Plus Strips and Lancets  .... Tests one time daily 9)  Aspirin 81 Mg Tabs (Aspirin) .Marland Kitchen.. 1 by mouth once daily 10)  Aleve 220 Mg Tabs (Naproxen sodium) .Marland Kitchen.. 1 by mouth once daily as needed 11)  Tylophen 325 Mg Tabs (Acetaminophen) .Marland Kitchen.. 1 by mouth once daily as needed 12)  Tramadol Hcl 50 Mg Tabs (Tramadol hcl) .Marland Kitchen.. 1 q 6 hrs as needed neck pain  Patient Instructions: 1)   Follow "40 Guidelines": 40 oz water/ day; < 40 inches @ waist; LESS THAN 40 grams of sugar /day from foods & drinks with HFCS as #1,2 or #3 on label; 40 min walking 3X/week. 2)  Please schedule a follow-up appointment in 6 months. 3)  BUN,creat, K+ prior to visit, ICD-9: 4)  Hepatic Panel prior to visit, ICD-9: 5)  Lipid Panel prior to visit, ICD-9: 6)  CBC w/ Diff, iron panel, B12  prior to visit, ICD-9: 7)  HbgA1C prior to visit, ICD-9: 8)  Urine Microalbumin prior to visit, ICD-9: Prescriptions: TRAMADOL HCL 50 MG TABS (TRAMADOL HCL) 1 q 6 hrs as needed neck pain  #30 x 3   Entered and Authorized by:   Marga Melnick MD   Signed by:   Marga Melnick MD on 01/02/2010   Method used:   Print then Give to Patient   RxID:   [id number] FENOFIBRATE 160 MG  TABS (FENOFIBRATE) 1 by mouth once daily  #90 x 1   Entered and Authorized by:   Marga Melnick MD   Signed by:   Marga Melnick MD on 01/02/2010   Method used:   Print then Give to Patient   RxID:   [id number] AMARYL 2 MG  TABS (GLIMEPIRIDE) 1/2 tab qd  #90 Each x 0   Entered and Authorized by:   Marga Melnick MD   Signed by:   Marga Melnick MD on 01/02/2010   Method used:   Print then Give to Patient   RxID:   [id number] METFORMIN HCL 1000  MG  TABS (METFORMIN HCL) 1 by mouth two times a day  #180 x 1   Entered and Authorized by:   Marga Melnick MD   Signed by:   Marga Melnick MD on 01/02/2010   Method used:   Print then Give to Patient   RxID:   [id number] ATENOLOL 25 MG  TABS (ATENOLOL) 1 by mouth qam  #90 Each x 1   Entered and Authorized by:   Marga Melnick MD   Signed by:   Marga Melnick MD on 01/02/2010   Method used:   Print then Give to Patient   RxID:   [id number]

## 2011-01-23 NOTE — Assessment & Plan Note (Signed)
Summary: B-12//PH  Nurse Visit  CC: Vit B-12 inj./kb   Allergies: 1)  ! * Tetanus  Medication Administration  Injection # 1:    Medication: Vit B12 1000 mcg    Diagnosis: ANEMIA, B12 DEFICIENCY (ICD-281.1)    Route: IM    Site: R deltoid    Exp Date: 02/20/2012    Lot #: [id number]    Mfr: [employer] Regent    Patient tolerated injection without complications    Given by: Lucious Groves CMA (December 03, 2010 2:19 PM)  Orders Added: 1)  Vit B12 1000 mcg [J3420] 2)  Admin of Therapeutic Inj  intramuscular or subcutaneous [16109]

## 2011-01-23 NOTE — Assessment & Plan Note (Signed)
Summary: 6 month roa//lch   Vital Signs:  Patient profile:   70 year old male Height:      70.75 inches Weight:      218.13 pounds BMI:     30.75 Pulse rate:   76 / minute Pulse rhythm:   regular Resp:     15 per minute BP sitting:   126 / 82  (left arm) Cuff size:   large  Vitals Entered By: Army Fossa CMA (January 02, 2011 3:39 PM) CC: 6 month f/u. Discuss lab results. Pulled muscle in lower back., Type 2 diabetes mellitus follow-up Comments refill on atenolol, amaryl, fenofibrate, and metformin.    CC:  6 month f/u. Discuss lab results. Pulled muscle in lower back. and Type 2 diabetes mellitus follow-up.  History of Present Illness: Hyperlipidemia Follow-Up      This is a 70 year old man who presents for Hyperlipidemia follow-up.  The patient denies muscle aches, GI upset, abdominal pain, flushing, itching, constipation, diarrhea, and fatigue.  The patient denies the following symptoms: chest pain/pressure, dypsnea, palpitations, syncope, and pedal edema.  Compliance with medications (by patient report) has been near 100%.  Dietary compliance has been good  to  fair.  The patient reports no exercise.  Adjunctive measures currently used by the patient include fiber and ASA.   LDL  is @ goal  (88) ; Triglycerides has risen from 162 to 180.. Type 2 Diabetes Mellitus Follow-Up      The patient is also here for Type 2 diabetes mellitus follow-up.  The patient reports weight gain of 2 #, but denies polyuria, polydipsia, blurred vision, self managed hypoglycemia, and numbness of extremities.  The patient denies the following symptoms: neuropathic pain, vomiting, orthostatic symptoms, intermittent claudication, vision loss, and foot ulcer.  Since the last visit the patient reports not monitoring blood glucose.  Since the last visit, the patient reports having had eye care by an ophthalmologist.  A1c is essentially stable but creatinine is up from 1.4 to 1.6.  Current Medications  (verified): 1)  Atenolol 25 Mg  Tabs (Atenolol) .Marland Kitchen.. 1 By Mouth Qam 2)  Metformin Hcl 1000  Mg  Tabs (Metformin Hcl) .Marland Kitchen.. 1 By Mouth Two Times A Day 3)  Amaryl 2 Mg  Tabs (Glimepiride) .... 1/2 Tab Qd 4)  Fenofibrate 160 Mg  Tabs (Fenofibrate) .Marland Kitchen.. 1 By Mouth Once Daily 5)  Prilosec Otc 20 Mg  Tbec (Omeprazole Magnesium) .Marland Kitchen.. 1 By Mouth Qd 6)  Basa 7)  Cvs Omeprazole 20 Mg  Tbec (Omeprazole) .Marland Kitchen.. 1 Once Daily As Needed 8)  Accucheck Compact Plus Strips and Lancets .... Tests One Time Daily 9)  Aspirin 81 Mg Tabs (Aspirin) .Marland Kitchen.. 1 By Mouth Once Daily 10)  Aleve 220 Mg Tabs (Naproxen Sodium) .Marland Kitchen.. 1 By Mouth Once Daily As Needed 11)  Tylenol Extra Strength 500 Mg Tabs (Acetaminophen) .Marland Kitchen.. 1 By Mouth Once Daily As Needed 12)  Tramadol Hcl 50 Mg Tabs (Tramadol Hcl) .Marland Kitchen.. 1 Q 6 Hrs As Needed Neck Pain  Allergies (verified): 1)  ! * Tetanus  Review of Systems GU:  Complains of dysuria; denies discharge and hematuria.  Physical Exam  General:  well-nourished,in no acute distress; alert,appropriate and cooperative throughout examination Lungs:  Normal respiratory effort, chest expands symmetrically. Lungs are clear to auscultation, no crackles or wheezes. Heart:  Normal rate and regular rhythm. S1 and S2 normal without gallop, murmur, click, rub or other extra sounds. Abdomen:  Bowel sounds positive,abdomen soft and non-tender  without masses, organomegaly or hernias noted. No bruits Pulses:  R and L carotid,radial,dorsalis pedis and posterior tibial pulses are full and equal bilaterally Extremities:  No clubbing, cyanosis, edema. good nail health   Neurologic:  alert & oriented X3 and sensation intact to light touch over feet.   Skin:  Intact without suspicious lesions or rashes Psych:  memory intact for recent and remote, normally interactive, and good eye contact.     Impression & Recommendations:  Problem # 1:  DIABETES MELLITUS, TYPE II, CONTROLLED (ICD-250.00)  His updated medication  list for this problem includes:    Amaryl 2 Mg Tabs (Glimepiride) .Marland Kitchen... 1/2 tab qd    Aspirin 81 Mg Tabs (Aspirin) .Marland Kitchen... 1 by mouth once daily    Ramipril 2.5 Mg Caps (Ramipril) .Marland Kitchen... 1 once daily  Problem # 2:  RENAL INSUFFICIENCY (ICD-588.9)  new  Orders: Prescription Created Electronically [phone])  Problem # 3:  HYPERLIPIDEMIA (ICD-272.4)  TG have risen His updated medication list for this problem includes:    Fenofibrate 160 Mg Tabs (Fenofibrate) .Marland Kitchen... 1 by mouth once daily  Problem # 4:  HYPERTENSION (ICD-401.9)  His updated medication list for this problem includes:    Atenolol 25 Mg Tabs (Atenolol) .Marland Kitchen... 1/2 once daily    Ramipril 2.5 Mg Caps (Ramipril) .Marland Kitchen... 1 once daily  Orders: Prescription Created Electronically [phone])  Complete Medication List: 1)  Atenolol 25 Mg Tabs (Atenolol) .... 1/2 once daily 2)  Amaryl 2 Mg Tabs (Glimepiride) .... 1/2 tab qd 3)  Fenofibrate 160 Mg Tabs (Fenofibrate) .Marland Kitchen.. 1 by mouth once daily 4)  Prilosec Otc 20 Mg Tbec (Omeprazole magnesium) .Marland Kitchen.. 1 by mouth qd 5)  Basa  6)  Cvs Omeprazole 20 Mg Tbec (Omeprazole) .Marland Kitchen.. 1 once daily as needed 7)  Accucheck Compact Plus Strips and Lancets  .... Tests one time daily 8)  Aspirin 81 Mg Tabs (Aspirin) .Marland Kitchen.. 1 by mouth once daily 9)  Aleve 220 Mg Tabs (Naproxen sodium) .Marland Kitchen.. 1 by mouth once daily as needed 10)  Tylenol Extra Strength 500 Mg Tabs (Acetaminophen) .Marland Kitchen.. 1 by mouth once daily as needed 11)  Tramadol Hcl 50 Mg Tabs (Tramadol hcl) .Marland Kitchen.. 1 q 6 hrs as needed neck pain 12)  Ramipril 2.5 Mg Caps (Ramipril) .Marland Kitchen.. 1 once daily  Patient Instructions: 1)  Please schedule a follow-up appointment in 3 months. 2)  BUN,creat, K+ ; 3)  Lipid Panel ; 4)  HbgA1C ;  5)  Urine Microalbumin ; 6)  CBC w/ Diff & B12  prior to visit. 7)  Drink  to thirst , up to 40 oz of water/ day. Avoid  HFCS sugar as discussed Prescriptions: FENOFIBRATE 160 MG  TABS (FENOFIBRATE) 1 by mouth once daily  #90 x 1    Entered and Authorized by:   Marga Melnick MD   Signed by:   Marga Melnick MD on 01/02/2011   Method used:   Electronically to        [facility]* (retail)       [address]       [place], [place]  [address]       Ph: [phone]       Fax: [phone]   RxID:   [id number] AMARYL 2 MG  TABS (GLIMEPIRIDE) 1/2 tab qd  #90 Each x 0   Entered and Authorized by:   Marga Melnick MD   Signed by:   Marga Melnick MD  on 01/02/2011   Method used:   Electronically to        [facility] (retail)       [address]       [place], [place]  [address]       Ph: [phone]       Fax: [phone]   RxID:   [id number] RAMIPRIL 2.5 MG CAPS (RAMIPRIL) 1 once daily  #30 x 5   Entered and Authorized by:   Marga Melnick MD   Signed by:   Marga Melnick MD on 01/02/2011   Method used:   Electronically to        [facility] (retail)       [address]       [place], [place]  [address]       Ph: [phone]       Fax: [phone]   RxID:   [id number] ATENOLOL 25 MG  TABS (ATENOLOL) 1/2 once daily  #90 x 0   Entered and Authorized by:   Marga Melnick MD   Signed by:   Marga Melnick MD on 01/02/2011   Method used:   Electronically to        [facility] (retail)       [address]       [place], [place]  [address]       Ph: [phone]       Fax: [phone]   RxID:   [id number]    Orders Added: 1)  Est. Patient Level IV [73710] 2)  Prescription Created Electronically [phone]  Appended Document: 6 month roa//lch    Clinical Lists Changes  Orders: Added new Service order of Admin of Therapeutic Inj  intramuscular or subcutaneous ([id number]) - Signed Added new Service order of Vit B12 1000 mcg (J3420) - Signed       Medication Administration  Injection # 1:    Medication: Vit B12 1000 mcg    Diagnosis: ANEMIA,  B12 DEFICIENCY (ICD-281.1)    Route: IM    Site: L deltoid    Exp Date: 02/2012    Lot #: [id number]    Mfr: American Regent    Patient tolerated injection without complications    Given by: Army Fossa CMA (January 02, 2011 4:26 PM)  Orders Added: 1)  Admin of Therapeutic Inj  intramuscular or subcutaneous [96372] 2)  Vit B12 1000 mcg [J3420]

## 2011-02-03 ENCOUNTER — Ambulatory Visit (INDEPENDENT_AMBULATORY_CARE_PROVIDER_SITE_OTHER): Payer: Medicare Other

## 2011-02-03 ENCOUNTER — Encounter: Payer: Self-pay | Admitting: Internal Medicine

## 2011-02-03 DIAGNOSIS — D518 Other vitamin B12 deficiency anemias: Secondary | ICD-10-CM

## 2011-02-12 NOTE — Assessment & Plan Note (Signed)
Summary: b12 inj  Nurse Visit   Allergies: 1)  ! * Tetanus  Medication Administration  Injection # 1:    Medication: Vit B12 1000 mcg    Diagnosis: ANEMIA, B12 DEFICIENCY (ICD-281.1)    Route: IM    Site: L deltoid    Exp Date: 02/2012    Lot #: [id number]    Mfr: American Regent    Patient tolerated injection without complications    Given by: Army Fossa CMA (February 03, 2011 2:21 PM)  Orders Added: 1)  Admin of Therapeutic Inj  intramuscular or subcutaneous [96372] 2)  Vit B12 1000 mcg [J3420] Prescriptions: FREESTYLE LANCETS  MISC (LANCETS) as directed.  #3 month x 1   Entered by:   Army Fossa CMA   Authorized by:   Marga Melnick MD   Signed by:   Army Fossa CMA on 02/03/2011   Method used:   Electronically to        [facility] (retail)       [address]       [place], [place]  [address]       Ph: [phone]       Fax: [phone]   RxID:   [id number] FREESTYLE LITE TEST  STRP (GLUCOSE BLOOD) as directed  #3 month x 1   Entered by:   Army Fossa CMA   Authorized by:   Marga Melnick MD   Signed by:   Army Fossa CMA on 02/03/2011   Method used:   Electronically to        [facility] (retail)       [address]       [place], [place]  [address]       Ph: [phone]       Fax: [phone]   RxID:   [id number]

## 2011-02-19 ENCOUNTER — Encounter: Payer: Self-pay | Admitting: Internal Medicine

## 2011-03-04 ENCOUNTER — Encounter: Payer: Self-pay | Admitting: Internal Medicine

## 2011-03-04 ENCOUNTER — Ambulatory Visit (INDEPENDENT_AMBULATORY_CARE_PROVIDER_SITE_OTHER): Payer: Medicare Other

## 2011-03-04 DIAGNOSIS — D518 Other vitamin B12 deficiency anemias: Secondary | ICD-10-CM

## 2011-03-11 NOTE — Assessment & Plan Note (Signed)
Summary: b12 shot///sph  Nurse Visit   Allergies: 1)  ! * Tetanus  Medication Administration  Injection # 1:    Medication: Vit B12 1000 mcg    Diagnosis: ANEMIA, B12 DEFICIENCY (ICD-281.1)    Route: IM    Site: L deltoid    Exp Date: 02/2012    Lot #: [id number]    Mfr: American Regent    Patient tolerated injection without complications    Given by: Army Fossa CMA (March 04, 2011 1:59 PM)  Orders Added: 1)  Vit B12 1000 mcg [J3420] 2)  Admin of Therapeutic Inj  intramuscular or subcutaneous [51884]

## 2011-03-24 ENCOUNTER — Other Ambulatory Visit (INDEPENDENT_AMBULATORY_CARE_PROVIDER_SITE_OTHER): Payer: Medicare Other

## 2011-03-24 DIAGNOSIS — I10 Essential (primary) hypertension: Secondary | ICD-10-CM

## 2011-03-24 DIAGNOSIS — E119 Type 2 diabetes mellitus without complications: Secondary | ICD-10-CM

## 2011-03-24 DIAGNOSIS — E785 Hyperlipidemia, unspecified: Secondary | ICD-10-CM

## 2011-03-24 DIAGNOSIS — Z0000 Encounter for general adult medical examination without abnormal findings: Secondary | ICD-10-CM

## 2011-03-24 LAB — CBC WITH DIFFERENTIAL/PLATELET
Basophils Absolute: 0 10*3/uL (ref 0.0–0.1)
Eosinophils Absolute: 0.2 10*3/uL (ref 0.0–0.7)
Lymphocytes Relative: 37.1 % (ref 12.0–46.0)
MCHC: 34.4 g/dL (ref 30.0–36.0)
MCV: 90.3 fl (ref 78.0–100.0)
Monocytes Absolute: 0.3 10*3/uL (ref 0.1–1.0)
Neutrophils Relative %: 51.5 % (ref 43.0–77.0)
Platelets: 238 10*3/uL (ref 150.0–400.0)
RDW: 13.9 % (ref 11.5–14.6)

## 2011-03-24 LAB — PSA: PSA: 1.35 ng/mL (ref 0.10–4.00)

## 2011-03-24 LAB — LIPID PANEL
HDL: 26.3 mg/dL — ABNORMAL LOW (ref >39.00)
Triglycerides: 254 mg/dL — ABNORMAL HIGH (ref 0.0–149.0)
VLDL: 50.8 mg/dL — ABNORMAL HIGH (ref 0.0–40.0)

## 2011-03-24 LAB — MICROALBUMIN / CREATININE URINE RATIO
Creatinine,U: 156.3 mg/dL
Microalb, Ur: 2.8 mg/dL — ABNORMAL HIGH (ref 0.0–1.9)

## 2011-03-24 LAB — LDL CHOLESTEROL, DIRECT: Direct LDL: 97 mg/dL

## 2011-03-24 LAB — BUN: BUN: 17 mg/dL (ref 6–23)

## 2011-03-24 LAB — CREATININE, SERUM: Creatinine, Ser: 1.4 mg/dL (ref 0.4–1.5)

## 2011-03-24 LAB — HEMOGLOBIN A1C: Hgb A1c MFr Bld: 6.8 % — ABNORMAL HIGH (ref 4.6–6.5)

## 2011-03-31 ENCOUNTER — Encounter: Payer: Self-pay | Admitting: Internal Medicine

## 2011-03-31 ENCOUNTER — Ambulatory Visit (INDEPENDENT_AMBULATORY_CARE_PROVIDER_SITE_OTHER): Payer: Medicare Other | Admitting: Internal Medicine

## 2011-03-31 VITALS — BP 130/76 | HR 64 | Wt 216.0 lb

## 2011-03-31 DIAGNOSIS — D518 Other vitamin B12 deficiency anemias: Secondary | ICD-10-CM

## 2011-03-31 DIAGNOSIS — N400 Enlarged prostate without lower urinary tract symptoms: Secondary | ICD-10-CM

## 2011-03-31 DIAGNOSIS — R300 Dysuria: Secondary | ICD-10-CM

## 2011-03-31 DIAGNOSIS — I10 Essential (primary) hypertension: Secondary | ICD-10-CM

## 2011-03-31 DIAGNOSIS — E119 Type 2 diabetes mellitus without complications: Secondary | ICD-10-CM

## 2011-03-31 DIAGNOSIS — E785 Hyperlipidemia, unspecified: Secondary | ICD-10-CM

## 2011-03-31 DIAGNOSIS — N259 Disorder resulting from impaired renal tubular function, unspecified: Secondary | ICD-10-CM

## 2011-03-31 DIAGNOSIS — D519 Vitamin B12 deficiency anemia, unspecified: Secondary | ICD-10-CM

## 2011-03-31 DIAGNOSIS — K649 Unspecified hemorrhoids: Secondary | ICD-10-CM

## 2011-03-31 DIAGNOSIS — D510 Vitamin B12 deficiency anemia due to intrinsic factor deficiency: Secondary | ICD-10-CM

## 2011-03-31 DIAGNOSIS — D126 Benign neoplasm of colon, unspecified: Secondary | ICD-10-CM

## 2011-03-31 MED ORDER — CYANOCOBALAMIN 1000 MCG/ML IJ SOLN
1000.0000 ug | Freq: Once | INTRAMUSCULAR | Status: AC
  Administered 2011-03-31: 1000 ug via INTRAMUSCULAR

## 2011-03-31 NOTE — Progress Notes (Signed)
Addended by: Stephan Minister on: 03/31/2011 06:02 PM   Modules accepted: Orders

## 2011-03-31 NOTE — Patient Instructions (Signed)
The most common cause of elevated triglycerides is the ingestion of sugar from high fructose corn syrup sources. You should consume less than 40 grams  of sugar per day from foods and drinks with high fructose corn syrup as number 2, 3, or #4 on the label.  Preventive Health Care: Exercise at least 30-45 minutes a day,  3-4 days a week.  Eat a low-fat diet with lots of fruits and vegetables, up to 7-9 servings per day. Avoid obesity; your goal is waist measurement < 40 inches.Consume less than 40 grams of sugar per day from foods & drinks with High Fructose Corn Sugar as #2,3 or # 4 on label.  Eye Doctor - have an eye exam @ least annually.                                                                                           Health Care Power of Attorney & Living Will. Complete if not in place ; these place you in charge of your health care decisions.  The A1c test checks the average amount of glucose (sugar) in the blood over the last 2 to 3 months.As glucose circulates in the blood, some of it binds to hemoglobin A,the main form of hemoglobin in adults. This combination of glucose and hemoglobin A is called A1c (or hemoglobin A1c or glycohemoglobin). Increased glucose in the blood, increases the hemoglobin A1c. A1c levels do not change quickly but will shift as older RBC's die and younger ones take their place.   The A1c test is used primarily to monitor the glucose control of diabetics over time. The goal of those with diabetes is to keep their blood glucose levels as close to normal as possible. This helps to minimize the complications caused by chronically elevated glucose levels, such as progressive damage to body organs like the kidneys, eyes, cardiovascular system, and nerves.    NORMAL VALUES  Non diabetic adults: 5 %-6.1%  Good diabetic control: 6.2-6.4 %  Fair diabetic control: 6.5-7%  Poor diabetic control: greater than 7 % ( except with additional factors such as  advanced age;  significant coronary or neurologic disease,etc). Check the A1c every 6 months if it is < 6.5%; every 4 months if  6.5% or higher.  Please check A1c every 4 months until A1c is < 6.5%.

## 2011-03-31 NOTE — Progress Notes (Signed)
  Subjective:    Patient ID: Nathan Howard, male    DOB: [birth year], 70 y.o.   MRN: [id number]  HPI Labs reviewed & risks discussed. Creat has improved from 1.6 to 1.4, but urine microalbumin up to 2.8 from 0.4. A1c up to 6.8% ( average sugar 149, risk 36%) from 6.55 ( 140 , risk 28%). B12 in normal range @ 509; HCT 38.9.                                                                                                                   HYPERTENSION  Disease Monitoring: Blood pressure range-not checked Chest pain, palpitations- no      Dyspnea- no  Medications: Compliance- yes Lightheadedness,Syncope- no   Edema- no   DIABETES  Disease Monitoring: Blood Sugar ranges-no Polyuria/phagia/dipsia- no      Visual problems- no  Medications: Compliance- yes Hypoglycemic symptoms- no    HYPERLIPIDEMIA  Disease Monitoring: See symptoms for Hypertension  Medications: Compliance- yes Abd pain, bowel changes- intermittent constipation  Muscle aches- no    ROS See HPI above :Patient reports no anorexia,  fever ,adenopathy, persistant / recurrent hoarseness, swallowing issues,  hemoptysis, dyspnea(rest, exertional, paroxysmal nocturnal), gastrointestinal  bleeding (melena, rectal bleeding), abdominal pain, excessive heart burn, GU symptoms(  hematuria, pyuria, voiding/incontinence  Issues) focal weakness, numbness & tingling, skin/hair/nail changes. No regular exercise.Intermittent dry cough. Some discomfort amost  every time he voids for > 1 year. Hemorrhoids are problematic ; this has delayed colonoscopy F/U.      Review of Systems     Objective:   Physical Exam   Gen.: Healthy and well-nourished in appearance. Alert, appropriate and cooperative throughout exam. Head: Normocephalic without obvious abnormalities;no alopecia. Eyes: No corneal or conjunctival inflammation noted.Fundal exam is benign without hemorrhages, exudate, papilledema.  Vision grossly normal. Lungs: Normal  respiratory effort; chest expands symmetrically. Lungs are clear to auscultation without rales, wheezes, or increased work of breathing. Heart: Normal rate and rhythm. Normal S1 and S2. No gallop, click, or rub. no murmur. Abdomen: Bowel sounds normal; abdomen soft and nontender. No masses, organomegaly or hernias noted. Genitalia: Scrotal and penile  exam  normal. DRE  Broad & flat w/o nodules . Musculoskeletal/extremities:  Nail health good.Pes planus  Vascular: Carotid, radial artery, dorsalis pedis and dorsalis posterior tibial pulses are full and equal. No bruits present. Neurologic: Alert and oriented x3. Sensation to light touch over feet..  Skin: Intact without suspicious lesions or rashes.  Lymph: No cervical, axillary, or inguinal lymphadenopathy present.  Psych: Mood and affect are normal; no evidence of anxiety or depression.            Assessment & Plan:  #1 DM; #2 Dyslipidemia; #  3 mild renal insufficiency; #4 B12 Deficiency ; #5 hemorrhoidal disease ;# 6 colon polyps , PMH of ; #7 BPH with irritative symptoms  Plan : Urinalysis ; Urology referral ; GI referral

## 2011-04-01 ENCOUNTER — Telehealth: Payer: Self-pay | Admitting: Internal Medicine

## 2011-04-01 NOTE — Telephone Encounter (Signed)
A1c & urine microalbumin, BUN, creat , K+ in 4 months (250.00, 585) . Uroloy referral should have been ade

## 2011-04-01 NOTE — Telephone Encounter (Signed)
Patient saw Dr Alwyn Ren on Mon 03/31/2011 and was told to make appt in 4 months for a1c and fasting lipids---he thought he was due to have "kidney tests" around this time---please let me know.   Thanks

## 2011-04-02 NOTE — Telephone Encounter (Signed)
Urology referral was placed and [facility] faxed info on the 9th

## 2011-04-02 NOTE — Telephone Encounter (Signed)
Added lab info to 07/31/2011 appt, will send this note  back to [facility] for Urology referral

## 2011-04-18 ENCOUNTER — Ambulatory Visit (INDEPENDENT_AMBULATORY_CARE_PROVIDER_SITE_OTHER): Payer: Medicare Other | Admitting: Gastroenterology

## 2011-04-18 ENCOUNTER — Encounter: Payer: Self-pay | Admitting: Gastroenterology

## 2011-04-18 DIAGNOSIS — K219 Gastro-esophageal reflux disease without esophagitis: Secondary | ICD-10-CM

## 2011-04-18 DIAGNOSIS — K602 Anal fissure, unspecified: Secondary | ICD-10-CM | POA: Insufficient documentation

## 2011-04-18 DIAGNOSIS — K6289 Other specified diseases of anus and rectum: Secondary | ICD-10-CM

## 2011-04-18 MED ORDER — HYDROCORTISONE ACE-PRAMOXINE 2.5-1 % RE CREA: TOPICAL_CREAM | RECTAL | Status: AC | PRN

## 2011-04-18 MED ORDER — PEG-KCL-NACL-NASULF-NA ASC-C 100 G PO SOLR: 1.0000 | Freq: Once | ORAL | Status: AC

## 2011-04-18 MED ORDER — HYDROCORTISONE ACETATE 25 MG RE SUPP: 25.0000 mg | Freq: Every day | RECTAL | Status: AC

## 2011-04-18 NOTE — Progress Notes (Signed)
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 Endoscopic exam with some 20 years ago. He had a colonoscopy in 2008, has a history of recurrent adenomatous colon polyps with a strong family history of colon cancer. Denies abdominal pain, anorexia, weight loss, or any hepatobiliary symptoms. Follow regular diet denies any  specific intolerances. He specifically denies dysphagia, but does have a diagnosis of pernicious anemia.  I have reviewed this patient's present history, medical and surgical past history, allergies and medications.     ROS: The remainder of the 10 point ROS is negative.. previous hemorrhoid surgical operations by Dr. Terri Piedra years ago. Does not abuse alcohol, cigarettes, or NSAIDs. 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 He had an appendectomy in 1953. He does complain of dysuria and occasional UTIs. There is no current history of cardiopulmonary complaints.     Physical Exam: General well developed well nourished patient in no acute distress, appearing their stated age Eyes PERRLA, no icterus, fundoscopic exam per opthamologist Skin no lesions noted Neck supple, no adenopathy, no thyroid enlargement, no tenderness Chest clear to percussion and auscultation Heart no significant murmurs, gallops or rubs noted Abdomen no hepatosplenomegaly masses or tenderness, BS normal.  Rectal inspection normal no fissures, or fistulae noted.  No masses or tenderness on digital exam. Stool guaiac negative. There is a posterior fissure with a skin tag which is tender to touch. Extremities no acute joint lesions, edema, phlebitis or evidence of cellulitis. Neurologic patient oriented x 3, cranial nerves intact, no focal neurologic deficits noted. Psychological mental status normal and normal affect.  Assessment  and plan: Probable hemorrhoidal bleeding versus a healing rectal fissure. We'll schedule followup colonoscopy, also endoscopy to exclude Barrett's mucosa because of his chronic history of gastroesophageal reflux disease. I reviewed all of his labs today from his recent primary care visit him I do not think he needs repeat labs at this time. Continue high fiber diet with daily fiber supplements, liberal by mouth fluids, and continue PPI therapy. Recent B12 level was normal.  Encounter Diagnoses  Name Primary?  Marland Kitchen Anal fissure   . Esophageal reflux

## 2011-04-18 NOTE — Patient Instructions (Signed)
Your procedure has been scheduled for 04/25/2011, please follow the seperate instructions.  Your prescription(s) have been sent to you pharmacy.

## 2011-04-24 ENCOUNTER — Encounter: Payer: Self-pay | Admitting: Gastroenterology

## 2011-04-25 ENCOUNTER — Other Ambulatory Visit (INDEPENDENT_AMBULATORY_CARE_PROVIDER_SITE_OTHER): Payer: Medicare Other | Admitting: Gastroenterology

## 2011-04-25 ENCOUNTER — Ambulatory Visit (AMBULATORY_SURGERY_CENTER): Payer: Medicare Other | Admitting: Gastroenterology

## 2011-04-25 ENCOUNTER — Encounter: Payer: Self-pay | Admitting: Gastroenterology

## 2011-04-25 DIAGNOSIS — K5010 Crohn's disease of large intestine without complications: Secondary | ICD-10-CM

## 2011-04-25 DIAGNOSIS — K219 Gastro-esophageal reflux disease without esophagitis: Secondary | ICD-10-CM

## 2011-04-25 DIAGNOSIS — K2950 Unspecified chronic gastritis without bleeding: Secondary | ICD-10-CM

## 2011-04-25 DIAGNOSIS — K2940 Chronic atrophic gastritis without bleeding: Secondary | ICD-10-CM

## 2011-04-25 DIAGNOSIS — K2990 Gastroduodenitis, unspecified, without bleeding: Secondary | ICD-10-CM

## 2011-04-25 DIAGNOSIS — K6289 Other specified diseases of anus and rectum: Secondary | ICD-10-CM

## 2011-04-25 DIAGNOSIS — K5730 Diverticulosis of large intestine without perforation or abscess without bleeding: Secondary | ICD-10-CM

## 2011-04-25 DIAGNOSIS — K602 Anal fissure, unspecified: Secondary | ICD-10-CM

## 2011-04-25 DIAGNOSIS — K921 Melena: Secondary | ICD-10-CM

## 2011-04-25 DIAGNOSIS — R197 Diarrhea, unspecified: Secondary | ICD-10-CM

## 2011-04-25 DIAGNOSIS — K2970 Gastritis, unspecified, without bleeding: Secondary | ICD-10-CM

## 2011-04-25 DIAGNOSIS — K5289 Other specified noninfective gastroenteritis and colitis: Secondary | ICD-10-CM

## 2011-04-25 LAB — GLUCOSE, CAPILLARY: Glucose-Capillary: 99 mg/dL (ref 70–99)

## 2011-04-25 MED ORDER — SODIUM CHLORIDE 0.9 % IV SOLN: 500.0000 mL | INTRAVENOUS | Status: DC

## 2011-04-25 MED ORDER — MESALAMINE 1.2 G PO TBEC: DELAYED_RELEASE_TABLET | ORAL | Status: DC

## 2011-04-26 ENCOUNTER — Other Ambulatory Visit: Payer: Self-pay | Admitting: Internal Medicine

## 2011-04-28 ENCOUNTER — Telehealth: Payer: Self-pay

## 2011-04-28 NOTE — Telephone Encounter (Signed)

## 2011-04-30 ENCOUNTER — Ambulatory Visit (INDEPENDENT_AMBULATORY_CARE_PROVIDER_SITE_OTHER): Payer: Medicare Other | Admitting: *Deleted

## 2011-04-30 DIAGNOSIS — E538 Deficiency of other specified B group vitamins: Secondary | ICD-10-CM

## 2011-04-30 MED ORDER — CYANOCOBALAMIN 1000 MCG/ML IJ SOLN
1000.0000 ug | Freq: Once | INTRAMUSCULAR | Status: AC
  Administered 2011-04-30: 1000 ug via INTRAMUSCULAR

## 2011-05-02 ENCOUNTER — Encounter: Payer: Self-pay | Admitting: Gastroenterology

## 2011-05-09 NOTE — Assessment & Plan Note (Signed)
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  Comes in today, on March 5, says he has lower abdominal pain,  more so on the left side, for the past month.  The pain has basically  subsided.  He says he thinks he needs to have a colonoscopy examination.  His last procedure was done in 2004 and we found some diverticular  disease, a very small polyp which was removed and noted that he had a  redundant colon, several small polyps that were removed, noted that he  had a redundant colon.  Otherwise, he says he is doing well.   PHYSICAL EXAMINATION:  He weighed 216, blood pressure 130/82, pulse 61  and regular.  Neck, lungs, heart, extremities unremarkable.   IMPRESSION:  1. Mild to moderate obesity.  2. Mild anxiety, depression.  3. Status post colon polyps; however the patient has an elongated      tortuous colon and over the past month had some left lower quadrant      discomfort which has been alleviated for the most part at this      time.  4. Gastroesophageal reflux disease.  Treated with AcipHex.  5. Status post hemorrhoidectomy.  6. Status post cholecystectomy.   RECOMMENDATION:  Continue the same medicine.  He is taking Prilosec over-  the-counter as well as metformin, Amaryl, atenolol.     Ulyess Mort, MD  Electronically Signed    SML/MedQ  DD: 02/24/2007  DT: 02/24/2007  Job #: [id number]   cc:   Titus Dubin. Alwyn Ren, MD,FACP,FCCP

## 2011-05-13 ENCOUNTER — Ambulatory Visit (INDEPENDENT_AMBULATORY_CARE_PROVIDER_SITE_OTHER): Payer: Medicare Other | Admitting: Gastroenterology

## 2011-05-13 ENCOUNTER — Encounter: Payer: Self-pay | Admitting: Gastroenterology

## 2011-05-13 VITALS — BP 122/74 | HR 60 | Ht 71.0 in | Wt 218.0 lb

## 2011-05-13 DIAGNOSIS — K5730 Diverticulosis of large intestine without perforation or abscess without bleeding: Secondary | ICD-10-CM

## 2011-05-13 DIAGNOSIS — K589 Irritable bowel syndrome without diarrhea: Secondary | ICD-10-CM

## 2011-05-13 DIAGNOSIS — K219 Gastro-esophageal reflux disease without esophagitis: Secondary | ICD-10-CM

## 2011-05-13 NOTE — Patient Instructions (Signed)
Stop the Lialda.  Today you watched a movie on Diverticulitis. Handout given on sweeteners to avoid.  Take Benefiber once a day.

## 2011-05-13 NOTE — Progress Notes (Signed)
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 He also has chronic B12 deficiency and probable pernicious anemia. Endoscopy showed atrophic gastritis but biopsies showed no evidence of H. pylori infection. He had severe diverticulosis with early segmental colitis seen on colonoscopy. He was placed on Lialda  aminosalicylate therapy and unfortunately had rather severe watery diarrhea and discontinued this compound. Currently he is on a high fiber diet and denies any GI complaints except for gas and bloating. He is chewing diet gone today, and have advised him to discontinue sorbitol, fructose, another nonabsorbable carbohydrates in his diet. View of his labs otherwise were unremarkable. Discussed his endoscopic findings and clinical symptoms in detail.   Current Medications, Allergies, Past Medical History, Past Surgical History, Family History and Social History were reviewed in [employer] record.   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 I have asked him to follow a high fiber diet and to use daily Benefiber, discontinue sorbitol and fructose, mannitol etc. he saw our patient education video on diverticulosis management. I do not think he needs further aminosalicylate therapy. Continue his other medications as listed and reviewed in his chart when necessary GI followup as needed.

## 2011-05-30 ENCOUNTER — Ambulatory Visit (INDEPENDENT_AMBULATORY_CARE_PROVIDER_SITE_OTHER): Payer: Medicare Other

## 2011-05-30 ENCOUNTER — Ambulatory Visit: Payer: Medicare Other | Admitting: Internal Medicine

## 2011-05-30 DIAGNOSIS — D518 Other vitamin B12 deficiency anemias: Secondary | ICD-10-CM

## 2011-05-30 DIAGNOSIS — D519 Vitamin B12 deficiency anemia, unspecified: Secondary | ICD-10-CM

## 2011-05-30 MED ORDER — CYANOCOBALAMIN 1000 MCG/ML IJ SOLN
1000.0000 ug | Freq: Once | INTRAMUSCULAR | Status: AC
  Administered 2011-05-30: 1000 ug via INTRAMUSCULAR

## 2011-06-30 ENCOUNTER — Ambulatory Visit (INDEPENDENT_AMBULATORY_CARE_PROVIDER_SITE_OTHER): Payer: Medicare Other | Admitting: *Deleted

## 2011-06-30 DIAGNOSIS — E538 Deficiency of other specified B group vitamins: Secondary | ICD-10-CM

## 2011-07-01 MED ORDER — CYANOCOBALAMIN 1000 MCG/ML IJ SOLN
1000.0000 ug | Freq: Once | INTRAMUSCULAR | Status: AC
  Administered 2011-06-30: 1000 ug via INTRAMUSCULAR

## 2011-07-02 ENCOUNTER — Other Ambulatory Visit: Payer: Self-pay | Admitting: Internal Medicine

## 2011-07-02 MED ORDER — RAMIPRIL 2.5 MG PO CAPS: 2.5000 mg | ORAL_CAPSULE | Freq: Every day | ORAL | Status: DC

## 2011-07-02 NOTE — Telephone Encounter (Signed)
Rx sent to pharmacy   

## 2011-07-29 ENCOUNTER — Other Ambulatory Visit: Payer: Self-pay | Admitting: Internal Medicine

## 2011-07-29 MED ORDER — FENOFIBRATE 160 MG PO TABS: 160.0000 mg | ORAL_TABLET | Freq: Every day | ORAL | Status: DC

## 2011-07-29 NOTE — Telephone Encounter (Signed)
RX sent to pharmacy  

## 2011-07-31 ENCOUNTER — Other Ambulatory Visit: Payer: Medicare Other

## 2011-07-31 ENCOUNTER — Other Ambulatory Visit (INDEPENDENT_AMBULATORY_CARE_PROVIDER_SITE_OTHER): Payer: Medicare Other | Admitting: *Deleted

## 2011-07-31 DIAGNOSIS — N189 Chronic kidney disease, unspecified: Secondary | ICD-10-CM

## 2011-07-31 DIAGNOSIS — E538 Deficiency of other specified B group vitamins: Secondary | ICD-10-CM

## 2011-07-31 DIAGNOSIS — E119 Type 2 diabetes mellitus without complications: Secondary | ICD-10-CM

## 2011-07-31 LAB — BUN: BUN: 19 mg/dL (ref 6–23)

## 2011-07-31 LAB — POTASSIUM: Potassium: 4.2 mEq/L (ref 3.5–5.1)

## 2011-07-31 MED ORDER — CYANOCOBALAMIN 1000 MCG/ML IJ SOLN
1000.0000 ug | Freq: Once | INTRAMUSCULAR | Status: AC
  Administered 2011-07-31: 1000 ug via INTRAMUSCULAR

## 2011-08-01 LAB — MICROALBUMIN / CREATININE URINE RATIO
Creatinine,U: 109.3 mg/dL
Microalb, Ur: 0.3 mg/dL (ref 0.0–1.9)

## 2011-08-11 ENCOUNTER — Ambulatory Visit (INDEPENDENT_AMBULATORY_CARE_PROVIDER_SITE_OTHER): Payer: Medicare Other | Admitting: Internal Medicine

## 2011-08-11 ENCOUNTER — Encounter: Payer: Self-pay | Admitting: Internal Medicine

## 2011-08-11 VITALS — BP 124/80 | HR 74 | Wt 220.0 lb

## 2011-08-11 DIAGNOSIS — E119 Type 2 diabetes mellitus without complications: Secondary | ICD-10-CM

## 2011-08-11 DIAGNOSIS — I10 Essential (primary) hypertension: Secondary | ICD-10-CM

## 2011-08-11 MED ORDER — METFORMIN HCL ER 500 MG PO TB24: 500.0000 mg | ORAL_TABLET | Freq: Every day | ORAL | Status: DC

## 2011-08-11 MED ORDER — LOSARTAN POTASSIUM 100 MG PO TABS: 100.0000 mg | ORAL_TABLET | Freq: Every day | ORAL | Status: DC

## 2011-08-11 NOTE — Progress Notes (Signed)
Subjective:    Patient ID: Nathan Howard, male    DOB: [birth year], 70 y.o.   MRN: [id number]  HPI Diabetes status assessment: Fasting or morning glucose range or average :  Not checked  . Highest glucose 2 hours after any meal:  Not checked. Hypoglycemia :  Very rarely post increased activity .                                                     Excess thirst; Excess hunger;  Excess urination:  no.                                  Lightheadedness with standing:  occasionally. Chest pain ; Palpitations;  Pain in  calves with walking:  no .                                                                                                                                Non healing skin  ulcers or sores,especially over the feet:  no. Numbness or tingling or burning in feet : no .                                                                                                                                              Significant change in  Weight : up 3#. Vision changes : no  .                                                                    Exercise : decreased . Nutrition/diet:  Eating out. Medication compliance : yes. Medication adverse  Effects:  no . Eye exam : neg exam. Foot care : no.  A1c/ urine microalbumin monitor:  Stable @ 6.8%       Review of Systems he describes an intermittent dry cough as well as a tickle in  his throat. He has discussed that this might be related to ramipril and with his pharmacist. He denies any angioedema.  He describes postnasal drainage; he also describes intermittent sneezing.     Objective:   Physical Exam Gen.: Healthy and well-nourished in appearance. Alert, appropriate and cooperative throughout exam. Eyes: No corneal or conjunctival inflammation noted.   The oropharynx is unremarkable except for an elongated thin uvula. There is no erythema or exudates. Nares: unremarkable ; no lesions or exudate   Lungs: Normal respiratory effort; chest  expands symmetrically. Lungs are clear to auscultation without rales, wheezes, or increased work of breathing. He exhibits an intermittent dry cough  .   Heart: Normal rate and rhythm. Normal S1 and S2. No gallop, click, or rub. No  murmur.                                                                              Musculoskeletal/extremities:  No clubbing, cyanosis, edema, or deformity noted.  Nail health  Good.Pes planus Vascular: Carotid, radial artery, dorsalis pedis and  posterior tibial pulses are full and equal. No bruits present. Neurologic: Alert and oriented x3. Deep tendon reflexes symmetrical and normal. Light touch normal         Skin: Intact without suspicious lesions or rashes.  Lymphadenopathy: None present at the neck or axilla Psych: Mood and affect are normal. Normally interactive                                                                                         Assessment & Plan:  #1 diabetes, A1c stable at 6.8. No glucose monitoring and no specific diet plan  #2 cough in the context of ACE inhibitor therapy  #3 postnasal drainage and sneezing   Plan: A low glycemic car program with repeat A1c and lipids in 4 months. Optimally I'd  like to have him off the Glimiperide  and on metformin alone as the latter is an insulin sensitizer. This was discussed.

## 2011-08-11 NOTE — Patient Instructions (Addendum)
Preventive Health Care: Exercise at least 30-45 minutes a day,  3-4 days a week.  Eat a low-fat diet with lots of fruits and vegetables, up to 7-9 servings per day. Avoid obesity; your goal is waist measurement < 40 inches.Consume less than 40 grams of sugar per day from foods & drinks with High Fructose Corn Sugar as #1,2,3 or # 4 on label. Follow the low carb nutrition program in [employer] as closely as possible to prevent Diabetes progression & complications. White carbohydrates (potatoes, rice, bread, and pasta) have a high spike of sugar and a high load of sugar. For example a  baked potato has a cup of sugar and a  french fry  2 teaspoons of sugar. Yams, wild  rice, whole grained bread &  wheat pasta have been much lower spike and load of  sugar. Portions should be the size of a deck of cards or your palm.  Over-the-counter loratadine 10 mg  can be used as needed for any allergic symptoms.  Please  schedule fasting Labs : BMET,Lipids, hepatic panel, A1c ( 250.00, 272.4,995.20)

## 2011-09-01 ENCOUNTER — Ambulatory Visit: Payer: Medicare Other

## 2011-09-01 ENCOUNTER — Ambulatory Visit (INDEPENDENT_AMBULATORY_CARE_PROVIDER_SITE_OTHER): Payer: Medicare Other | Admitting: *Deleted

## 2011-09-01 DIAGNOSIS — E538 Deficiency of other specified B group vitamins: Secondary | ICD-10-CM

## 2011-09-01 MED ORDER — CYANOCOBALAMIN 1000 MCG/ML IJ SOLN: 1000.0000 ug | Freq: Once | INTRAMUSCULAR | Status: DC

## 2011-10-01 ENCOUNTER — Ambulatory Visit (INDEPENDENT_AMBULATORY_CARE_PROVIDER_SITE_OTHER): Payer: Medicare Other | Admitting: *Deleted

## 2011-10-01 DIAGNOSIS — E538 Deficiency of other specified B group vitamins: Secondary | ICD-10-CM

## 2011-10-01 MED ORDER — CYANOCOBALAMIN 1000 MCG/ML IJ SOLN
1000.0000 ug | Freq: Once | INTRAMUSCULAR | Status: AC
  Administered 2011-10-01: 1000 ug via INTRAMUSCULAR

## 2011-10-09 ENCOUNTER — Ambulatory Visit (INDEPENDENT_AMBULATORY_CARE_PROVIDER_SITE_OTHER): Payer: Medicare Other

## 2011-10-09 DIAGNOSIS — Z23 Encounter for immunization: Secondary | ICD-10-CM

## 2011-11-03 ENCOUNTER — Ambulatory Visit (INDEPENDENT_AMBULATORY_CARE_PROVIDER_SITE_OTHER): Payer: Medicare Other | Admitting: *Deleted

## 2011-11-03 DIAGNOSIS — E538 Deficiency of other specified B group vitamins: Secondary | ICD-10-CM

## 2011-11-03 MED ORDER — CYANOCOBALAMIN 1000 MCG/ML IJ SOLN
1000.0000 ug | Freq: Once | INTRAMUSCULAR | Status: AC
  Administered 2011-11-03: 1000 ug via INTRAMUSCULAR

## 2011-12-04 ENCOUNTER — Ambulatory Visit: Payer: Medicare Other

## 2011-12-04 ENCOUNTER — Telehealth: Payer: Self-pay | Admitting: Internal Medicine

## 2011-12-04 DIAGNOSIS — D519 Vitamin B12 deficiency anemia, unspecified: Secondary | ICD-10-CM

## 2011-12-04 DIAGNOSIS — D518 Other vitamin B12 deficiency anemias: Secondary | ICD-10-CM

## 2011-12-04 MED ORDER — CYANOCOBALAMIN 1000 MCG/ML IJ SOLN
1000.0000 ug | Freq: Once | INTRAMUSCULAR | Status: AC
  Administered 2011-12-04: 1000 ug via INTRAMUSCULAR

## 2011-12-04 NOTE — Telephone Encounter (Signed)
Lipid/Hep/CBCD/TSH/ BMP/A1c/Udip/Stool Cards  272.4/995.20/401.9/250.00  Please make sure patient is aware that although Dr.Hopper would like labs done prior IF medicare is the only insurance there is a great chance that labs will not be covered and he should check with insurance prior to having labs

## 2011-12-05 NOTE — Telephone Encounter (Signed)
Scheduled pt with lab appt.

## 2011-12-09 ENCOUNTER — Other Ambulatory Visit: Payer: Self-pay | Admitting: Internal Medicine

## 2011-12-09 NOTE — Telephone Encounter (Signed)
a1c 250.00  

## 2012-01-05 ENCOUNTER — Other Ambulatory Visit: Payer: Self-pay | Admitting: Internal Medicine

## 2012-01-05 DIAGNOSIS — I10 Essential (primary) hypertension: Secondary | ICD-10-CM

## 2012-01-05 DIAGNOSIS — T887XXA Unspecified adverse effect of drug or medicament, initial encounter: Secondary | ICD-10-CM

## 2012-01-05 DIAGNOSIS — E785 Hyperlipidemia, unspecified: Secondary | ICD-10-CM

## 2012-01-05 DIAGNOSIS — E119 Type 2 diabetes mellitus without complications: Secondary | ICD-10-CM

## 2012-01-06 ENCOUNTER — Other Ambulatory Visit (INDEPENDENT_AMBULATORY_CARE_PROVIDER_SITE_OTHER): Payer: Medicare Other

## 2012-01-06 DIAGNOSIS — I10 Essential (primary) hypertension: Secondary | ICD-10-CM

## 2012-01-06 DIAGNOSIS — D519 Vitamin B12 deficiency anemia, unspecified: Secondary | ICD-10-CM

## 2012-01-06 DIAGNOSIS — E785 Hyperlipidemia, unspecified: Secondary | ICD-10-CM

## 2012-01-06 DIAGNOSIS — T887XXA Unspecified adverse effect of drug or medicament, initial encounter: Secondary | ICD-10-CM

## 2012-01-06 DIAGNOSIS — E119 Type 2 diabetes mellitus without complications: Secondary | ICD-10-CM

## 2012-01-06 LAB — LIPID PANEL
HDL: 31.5 mg/dL — ABNORMAL LOW (ref >39.00)
Triglycerides: 228 mg/dL — ABNORMAL HIGH (ref 0.0–149.0)
VLDL: 45.6 mg/dL — ABNORMAL HIGH (ref 0.0–40.0)

## 2012-01-06 LAB — HEMOGLOBIN A1C: Hgb A1c MFr Bld: 6.7 % — ABNORMAL HIGH (ref 4.6–6.5)

## 2012-01-06 LAB — BASIC METABOLIC PANEL
Calcium: 9.4 mg/dL (ref 8.4–10.5)
Creatinine, Ser: 1.5 mg/dL (ref 0.4–1.5)
GFR: 48.77 mL/min — ABNORMAL LOW (ref >60.00)
Sodium: 138 mEq/L (ref 135–145)

## 2012-01-06 LAB — POCT URINALYSIS DIPSTICK
Ketones, UA: NEGATIVE
Leukocytes, UA: NEGATIVE
Nitrite, UA: NEGATIVE
Protein, UA: NEGATIVE
Urobilinogen, UA: 0.2
pH, UA: 6

## 2012-01-06 LAB — CBC WITH DIFFERENTIAL/PLATELET
Basophils Relative: 0.7 % (ref 0.0–3.0)
Eosinophils Relative: 4.8 % (ref 0.0–5.0)
Hemoglobin: 13.3 g/dL (ref 13.0–17.0)
Lymphocytes Relative: 35 % (ref 12.0–46.0)
Neutrophils Relative %: 53 % (ref 43.0–77.0)
RBC: 4.23 Mil/uL (ref 4.22–5.81)
WBC: 4.7 10*3/uL (ref 4.5–10.5)

## 2012-01-06 LAB — HEPATIC FUNCTION PANEL
ALT: 25 U/L (ref 0–53)
Total Bilirubin: 0.8 mg/dL (ref 0.3–1.2)
Total Protein: 7.6 g/dL (ref 6.0–8.3)

## 2012-01-06 LAB — LDL CHOLESTEROL, DIRECT: Direct LDL: 103.5 mg/dL

## 2012-01-06 MED ORDER — CYANOCOBALAMIN 1000 MCG/ML IJ SOLN
1000.0000 ug | Freq: Once | INTRAMUSCULAR | Status: AC
  Administered 2012-01-06: 1000 ug via INTRAMUSCULAR

## 2012-01-06 NOTE — Progress Notes (Signed)
Labs only

## 2012-01-08 ENCOUNTER — Telehealth: Payer: Self-pay | Admitting: Internal Medicine

## 2012-01-08 MED ORDER — METFORMIN HCL ER 500 MG PO TB24: 500.0000 mg | ORAL_TABLET | Freq: Every day | ORAL | Status: DC

## 2012-01-08 NOTE — Telephone Encounter (Signed)
Dr.Hopper please advise on labs, then forward back to me so I will know about sending in rx

## 2012-01-08 NOTE — Telephone Encounter (Signed)
Refill- metformin hcl er 500mg  . Take one tablet by mouth daily with breakfast. Qty 30 last fill 12.18.12

## 2012-01-08 NOTE — Telephone Encounter (Signed)
Refill x3 months; A1c will be due at that time. Code 250.00

## 2012-01-15 ENCOUNTER — Other Ambulatory Visit: Payer: Medicare Other

## 2012-01-15 ENCOUNTER — Ambulatory Visit (INDEPENDENT_AMBULATORY_CARE_PROVIDER_SITE_OTHER): Payer: Medicare Other | Admitting: Internal Medicine

## 2012-01-15 ENCOUNTER — Encounter: Payer: Self-pay | Admitting: Internal Medicine

## 2012-01-15 VITALS — BP 130/84 | HR 72 | Temp 98.1°F | Resp 12 | Ht 70.0 in | Wt 218.0 lb

## 2012-01-15 DIAGNOSIS — N259 Disorder resulting from impaired renal tubular function, unspecified: Secondary | ICD-10-CM

## 2012-01-15 DIAGNOSIS — D510 Vitamin B12 deficiency anemia due to intrinsic factor deficiency: Secondary | ICD-10-CM

## 2012-01-15 DIAGNOSIS — I10 Essential (primary) hypertension: Secondary | ICD-10-CM

## 2012-01-15 DIAGNOSIS — E785 Hyperlipidemia, unspecified: Secondary | ICD-10-CM

## 2012-01-15 DIAGNOSIS — Z0000 Encounter for general adult medical examination without abnormal findings: Secondary | ICD-10-CM

## 2012-01-15 DIAGNOSIS — E119 Type 2 diabetes mellitus without complications: Secondary | ICD-10-CM

## 2012-01-15 MED ORDER — FENOFIBRATE 160 MG PO TABS: 160.0000 mg | ORAL_TABLET | Freq: Every day | ORAL | Status: DC

## 2012-01-15 MED ORDER — METFORMIN HCL ER 500 MG PO TB24: 500.0000 mg | ORAL_TABLET | Freq: Every day | ORAL | Status: DC

## 2012-01-15 MED ORDER — LOSARTAN POTASSIUM 100 MG PO TABS: 100.0000 mg | ORAL_TABLET | Freq: Every day | ORAL | Status: DC

## 2012-01-15 MED ORDER — ATENOLOL 25 MG PO TABS: ORAL_TABLET | ORAL | Status: DC

## 2012-01-15 MED ORDER — GLIMEPIRIDE 2 MG PO TABS: ORAL_TABLET | ORAL | Status: DC

## 2012-01-15 NOTE — Assessment & Plan Note (Signed)
Creatinine is now high normal at 1.5 and has remained stable

## 2012-01-15 NOTE — Patient Instructions (Signed)
BUN, creatinine, and GFR  all assess kidney function. To protect the kidneys it  is important to control your blood pressure and sugar. You should also stay well hydrated. Drink to thirst, up to 40 ounces of water a day. Progressive kidney impairment is typically associated with anemia which is unrelated to  iron deficiency or B12 deficiency.  The most common cause of elevated triglycerides is the ingestion of sugar from high fructose corn syrup sources added to processed foods & drinks.  Eat a low-fat diet with lots of fruits and vegetables, up to 7-9 servings per day. Consume less than 40  (preferably ZERO) grams of sugar per day from foods & drinks with High Fructose Corn Syrup (HFCS) sugar as #1,2,3 or # 4 on label.Whole [employer], [employer] & [employer] do not carry products with HFCS. Follow a  low carb nutrition program such as [place] or [employer]  to prevent Diabetes progression . White carbohydrates (potatoes, rice, bread, and pasta) have a high spike of sugar and a high load of sugar. For example a  baked potato has a cup of sugar and a  french fry  2 teaspoons of sugar. Yams, wild  rice, whole grained bread &  wheat pasta have been much lower spike and load of  sugar. Portions should be the size of a deck of cards or your palm.  PLEASE BRING THESE INSTRUCTIONS TO FOLLOW UP  LAB APPOINTMENT for A1c in 4-5 mos.This will guarantee correct labs are drawn, eliminating need for repeat blood sampling ( needle sticks ! ). Diagnoses /Codes: 250.02

## 2012-01-15 NOTE — Assessment & Plan Note (Signed)
LDL is minimally above the goal of least less than 100; ideally less than 70 @  103.5. HDL is low at 31.5 as expected because triglycerides are 228. Again dietary interventions discussed.Marland Kitchen

## 2012-01-15 NOTE — Assessment & Plan Note (Signed)
Hematocrit is basically stable at 38.3. MCV is normal at 90.6.

## 2012-01-15 NOTE — Assessment & Plan Note (Signed)
Blood pressure is excellent today. Goals were provided.

## 2012-01-15 NOTE — Progress Notes (Signed)
Subjective:    Patient ID: Nathan Howard, male    DOB: June 21, 1941, 71 y.o.   MRN: [id number]  HPI Medicare Wellness Visit:  The following psychosocial & medical history were reviewed as required by Medicare.   Social history: caffeine: 3-4 cups coffee , alcohol: no ,  tobacco use : quit 1963  & exercise : irregularly.   Home & personal  safety / fall risk: no issues, activities of daily living: no limitations , seatbelt use : yes , and smoke alarm employment : yes .  Power of Attorney/Living Will status : ? In place  Vision ( as recorded per Nurse) & Hearing  evaluation :  Ophth  2012; no retinopathy. Orientation :oriented X 3 , memory & recall :good, spelling  testing:good,and mood & affect : normal . Depression / anxiety: denied Travel history : last [place] 1980s , immunization status : Shingles needed, transfusion history:  no, and preventive health surveillance ( colonoscopies, BMD , etc as per protocol/ [facility]): colonoscopy up to date, Dental care:  Seen every 6 months . Chart reviewed &  Updated. Active issues reviewed & addressed.       Review of Systems HYPERTENSION: Disease Monitoring: Blood pressure range-not checked  Chest pain, palpitations-no      Dyspnea- no Medications: Compliance- yes Lightheadedness,Syncope- no    Edema-minimally after prolonged standing  DIABETES: Disease Monitoring: Blood Sugar ranges-not checked  Polyuria/phagia/dipsia- no       Visual problems-no Medications: Compliance- yes Hypoglycemic symptoms- no  HYPERLIPIDEMIA: Disease Monitoring: See symptoms for Hypertension Medications: Compliance- yes  Abd pain, bowel changes- no   Muscle aches- no          Objective:   Physical Exam Gen.: Healthy and well-nourished in appearance. Alert, appropriate and cooperative throughout exam.Appears younger than stated age  Head: Normocephalic without obvious abnormalities;  no alopecia  Eyes: No corneal or conjunctival inflammation noted.  Pupils equal round reactive to light and accommodation.  Extraocular motion intact. Vision grossly normal. Ears: External  ear exam reveals no significant lesions or deformities. Canals clear .TMs normal. Hearing is grossly normal bilaterally to whisper @ 6 ft. Nose: External nasal exam reveals no deformity or inflammation. Nasal mucosa are pink and moist. No lesions or exudates noted.   Mouth: Oral mucosa and oropharynx reveal no lesions or exudates. Teeth in good repair. Neck: No deformities, masses, or tenderness noted. Range of motion & Thyroid normal Lungs: Normal respiratory effort; chest expands symmetrically. Lungs are clear to auscultation without rales, wheezes, or increased work of breathing. Heart: Normal rate and rhythm. Normal S1 and S2. No gallop, click, or rub. S 4 with slurring; no murmur. Abdomen: Bowel sounds normal; abdomen soft and nontender. No masses, organomegaly . Ventral  hernia noted. Genitalia: Colonoscopy < 6 months ago. PSA 1.35 on 03/24/2011 .                                                                                   Musculoskeletal/extremities: No deformity or scoliosis noted of  the thoracic or lumbar spine. No clubbing, cyanosis, edema, or deformity noted. Range of motion  normal .Tone & strength  normal.Joints normal. Nail health  Good.Pes planus. Vascular: Carotid, radial artery, dorsalis pedis and  posterior tibial pulses are full and equal. No bruits present. Neurologic: Alert and oriented x3. Deep tendon reflexes symmetrical and normal.  Light touch normal over feet         Skin: Intact without suspicious lesions or rashes. Lymph: No cervical, axillary lymphadenopathy present. Psych: Mood and affect are normal. Normally interactive                                                                                         Assessment & Plan:  #1 Medicare Wellness Exam; criteria met ; data entered #2 Problem List reviewed ; Assessment/ Recommendations  made Plan: see Orders

## 2012-01-15 NOTE — Assessment & Plan Note (Signed)
A1c is stable to improved at 6.7%; goal is less than 7% as long as there are no associated hypoglycemic episodes. A1c should be checked in 4-5 months. Nutritional interventions outlined.

## 2012-02-16 ENCOUNTER — Ambulatory Visit (INDEPENDENT_AMBULATORY_CARE_PROVIDER_SITE_OTHER): Payer: Medicare Other | Admitting: *Deleted

## 2012-02-16 DIAGNOSIS — E538 Deficiency of other specified B group vitamins: Secondary | ICD-10-CM

## 2012-02-16 MED ORDER — CYANOCOBALAMIN 1000 MCG/ML IJ SOLN
1000.0000 ug | Freq: Once | INTRAMUSCULAR | Status: AC
  Administered 2012-02-16: 1000 ug via INTRAMUSCULAR

## 2012-05-06 ENCOUNTER — Other Ambulatory Visit: Payer: Self-pay | Admitting: Internal Medicine

## 2012-05-14 ENCOUNTER — Other Ambulatory Visit: Payer: Medicare Other

## 2012-05-21 ENCOUNTER — Other Ambulatory Visit (INDEPENDENT_AMBULATORY_CARE_PROVIDER_SITE_OTHER): Payer: Medicare Other

## 2012-05-21 DIAGNOSIS — D518 Other vitamin B12 deficiency anemias: Secondary | ICD-10-CM

## 2012-05-21 DIAGNOSIS — E7421 Galactosemia: Secondary | ICD-10-CM

## 2012-05-21 DIAGNOSIS — IMO0001 Reserved for inherently not codable concepts without codable children: Secondary | ICD-10-CM

## 2012-05-21 LAB — HEMOGLOBIN A1C: Hgb A1c MFr Bld: 6.8 % — ABNORMAL HIGH (ref 4.6–6.5)

## 2012-05-21 LAB — VITAMIN B12: Vitamin B-12: 551 pg/mL (ref 211–911)

## 2012-05-21 NOTE — Progress Notes (Signed)
Labs only

## 2012-06-01 ENCOUNTER — Encounter: Payer: Self-pay | Admitting: *Deleted

## 2012-06-07 ENCOUNTER — Telehealth: Payer: Self-pay | Admitting: Internal Medicine

## 2012-06-07 MED ORDER — METFORMIN HCL ER 500 MG PO TB24: ORAL_TABLET | ORAL | Status: DC

## 2012-06-07 NOTE — Telephone Encounter (Signed)
Refill: Metformin hcl ER 500mg . Take 1 tablet by mouth daily with breakfast. Qty 30. Last fill 05-06-12

## 2012-06-07 NOTE — Telephone Encounter (Signed)
Rx sent 

## 2012-07-14 ENCOUNTER — Other Ambulatory Visit: Payer: Self-pay | Admitting: Internal Medicine

## 2012-10-07 ENCOUNTER — Other Ambulatory Visit: Payer: Self-pay | Admitting: Internal Medicine

## 2012-10-07 MED ORDER — METFORMIN HCL ER 500 MG PO TB24: ORAL_TABLET | ORAL | Status: DC

## 2012-10-07 NOTE — Telephone Encounter (Signed)
refill metformin hcl er 500mg  tab #30 take one tablet by mouth daily with breakfast--last fill 9.16.13--last ov 1.24.13 V70

## 2012-11-05 ENCOUNTER — Telehealth: Payer: Self-pay | Admitting: Internal Medicine

## 2012-11-05 DIAGNOSIS — E119 Type 2 diabetes mellitus without complications: Secondary | ICD-10-CM

## 2012-11-05 MED ORDER — METFORMIN HCL ER 500 MG PO TB24: ORAL_TABLET | ORAL | Status: DC

## 2012-11-05 NOTE — Telephone Encounter (Signed)
Refill: Metformin hcl er 500 mg. Take 1 tablet by mouth daily with breakfast. Qty 30. Last fill 10-07-12

## 2012-11-05 NOTE — Telephone Encounter (Signed)
RX sent, patient will need to schedule lab appointment. (Future order placed)

## 2012-12-06 ENCOUNTER — Telehealth: Payer: Self-pay | Admitting: Internal Medicine

## 2012-12-06 DIAGNOSIS — E119 Type 2 diabetes mellitus without complications: Secondary | ICD-10-CM

## 2012-12-06 MED ORDER — METFORMIN HCL ER 500 MG PO TB24: ORAL_TABLET | ORAL | Status: DC

## 2012-12-06 NOTE — Telephone Encounter (Signed)
RX sent

## 2012-12-06 NOTE — Telephone Encounter (Signed)
Refill: Metformin hcl er 500 mg. Take 1 tablet by mouth daily with breakfast. Qty 30. Last fill 11-05-12

## 2012-12-22 HISTORY — PX: SKIN BIOPSY: SHX1

## 2013-01-04 ENCOUNTER — Other Ambulatory Visit: Payer: Self-pay | Admitting: Internal Medicine

## 2013-01-04 NOTE — Telephone Encounter (Signed)
Future order placed 

## 2013-01-05 ENCOUNTER — Other Ambulatory Visit: Payer: Self-pay | Admitting: Internal Medicine

## 2013-01-05 DIAGNOSIS — E119 Type 2 diabetes mellitus without complications: Secondary | ICD-10-CM

## 2013-01-05 MED ORDER — METFORMIN HCL ER 500 MG PO TB24: ORAL_TABLET | ORAL | Status: DC

## 2013-01-05 NOTE — Telephone Encounter (Signed)
Rx sent 

## 2013-01-05 NOTE — Telephone Encounter (Signed)
refill MetFORMIN HCl (Tablet SR 24 hr)  500 MG CALL FOR LAB APPOINTMENT 1 by mouth daily with breakfast #30 last fill 12.16.13 NO LAB APPT SCHEDULED

## 2013-01-21 ENCOUNTER — Telehealth: Payer: Self-pay | Admitting: Internal Medicine

## 2013-01-21 MED ORDER — FENOFIBRATE 160 MG PO TABS: ORAL_TABLET | ORAL | Status: DC

## 2013-01-21 NOTE — Telephone Encounter (Signed)
Side Note: patient will need to schedule a CPX

## 2013-01-21 NOTE — Telephone Encounter (Signed)
Refill: Fenofibrate 160 mg tab. Take one tablet by mouth once daily. Qty 90. Last fill 10-20-12

## 2013-01-24 ENCOUNTER — Telehealth: Payer: Self-pay | Admitting: Internal Medicine

## 2013-01-24 DIAGNOSIS — E119 Type 2 diabetes mellitus without complications: Secondary | ICD-10-CM

## 2013-01-24 DIAGNOSIS — I10 Essential (primary) hypertension: Secondary | ICD-10-CM

## 2013-01-24 MED ORDER — LOSARTAN POTASSIUM 100 MG PO TABS: 100.0000 mg | ORAL_TABLET | Freq: Every day | ORAL | Status: DC

## 2013-01-24 MED ORDER — LOSARTAN POTASSIUM 100 MG PO TABS: ORAL_TABLET | ORAL | Status: DC

## 2013-01-24 NOTE — Telephone Encounter (Signed)
RX was re-printed, patient die for OV, Left message on VM informing patient to call and schedule appointment

## 2013-01-24 NOTE — Addendum Note (Signed)
Addended by: Maurice Small on: 01/24/2013 04:15 PM   Modules accepted: Orders

## 2013-01-24 NOTE — Telephone Encounter (Signed)
Refill: Losartan 100mg  tablets. Take one tablet daily. Qty 90. Last fill 10-26-12

## 2013-02-01 ENCOUNTER — Telehealth: Payer: Self-pay | Admitting: Internal Medicine

## 2013-02-01 DIAGNOSIS — E119 Type 2 diabetes mellitus without complications: Secondary | ICD-10-CM

## 2013-02-01 MED ORDER — METFORMIN HCL ER 500 MG PO TB24: ORAL_TABLET | ORAL | Status: DC

## 2013-02-01 MED ORDER — GLIMEPIRIDE 2 MG PO TABS: ORAL_TABLET | ORAL | Status: DC

## 2013-02-01 NOTE — Telephone Encounter (Signed)
refill Glimepiride (Tab) 2 MG 1/2 by mouth daily for DIABETES #15 last fill 1.14.14

## 2013-02-01 NOTE — Telephone Encounter (Signed)
Future order already placed for A1c that was due September 2013

## 2013-02-07 ENCOUNTER — Encounter: Payer: Self-pay | Admitting: Lab

## 2013-02-07 ENCOUNTER — Ambulatory Visit: Payer: Medicare Other | Admitting: Internal Medicine

## 2013-02-18 ENCOUNTER — Ambulatory Visit (INDEPENDENT_AMBULATORY_CARE_PROVIDER_SITE_OTHER): Payer: Medicare Other | Admitting: Internal Medicine

## 2013-02-18 ENCOUNTER — Encounter: Payer: Self-pay | Admitting: Internal Medicine

## 2013-02-18 VITALS — BP 124/80 | HR 78 | Wt 218.0 lb

## 2013-02-18 DIAGNOSIS — I10 Essential (primary) hypertension: Secondary | ICD-10-CM

## 2013-02-18 DIAGNOSIS — E119 Type 2 diabetes mellitus without complications: Secondary | ICD-10-CM

## 2013-02-18 DIAGNOSIS — E785 Hyperlipidemia, unspecified: Secondary | ICD-10-CM

## 2013-02-18 DIAGNOSIS — R0989 Other specified symptoms and signs involving the circulatory and respiratory systems: Secondary | ICD-10-CM

## 2013-02-18 DIAGNOSIS — Z860100 Personal history of colon polyps, unspecified: Secondary | ICD-10-CM

## 2013-02-18 DIAGNOSIS — R0689 Other abnormalities of breathing: Secondary | ICD-10-CM

## 2013-02-18 LAB — CBC WITH DIFFERENTIAL/PLATELET
Basophils Absolute: 0.1 10*3/uL (ref 0.0–0.1)
Basophils Relative: 1 % (ref 0–1)
Eosinophils Absolute: 0.2 10*3/uL (ref 0.0–0.7)
Hemoglobin: 13.2 g/dL (ref 13.0–17.0)
MCH: 29.6 pg (ref 26.0–34.0)
MCHC: 33.3 g/dL (ref 30.0–36.0)
Monocytes Absolute: 0.3 10*3/uL (ref 0.1–1.0)
Monocytes Relative: 6 % (ref 3–12)
Neutrophils Relative %: 49 % (ref 43–77)
RDW: 13.4 % (ref 11.5–15.5)

## 2013-02-18 LAB — HEPATIC FUNCTION PANEL
ALT: 19 U/L (ref 0–53)
AST: 29 U/L (ref 0–37)
Albumin: 4.7 g/dL (ref 3.5–5.2)
Total Bilirubin: 0.6 mg/dL (ref 0.3–1.2)

## 2013-02-18 LAB — LIPID PANEL
Cholesterol: 154 mg/dL (ref 0–200)
Triglycerides: 297 mg/dL — ABNORMAL HIGH (ref <150)

## 2013-02-18 LAB — BASIC METABOLIC PANEL
CO2: 22 mEq/L (ref 19–32)
Calcium: 9.8 mg/dL (ref 8.4–10.5)
Chloride: 104 mEq/L (ref 96–112)
Creat: 1.73 mg/dL — ABNORMAL HIGH (ref 0.50–1.35)
Glucose, Bld: 71 mg/dL (ref 70–99)

## 2013-02-18 MED ORDER — LOSARTAN POTASSIUM 100 MG PO TABS: ORAL_TABLET | ORAL | Status: DC

## 2013-02-18 MED ORDER — ATENOLOL 25 MG PO TABS: ORAL_TABLET | ORAL | Status: DC

## 2013-02-18 MED ORDER — GLIMEPIRIDE 2 MG PO TABS: ORAL_TABLET | ORAL | Status: DC

## 2013-02-18 MED ORDER — FENOFIBRATE 160 MG PO TABS: ORAL_TABLET | ORAL | Status: DC

## 2013-02-18 MED ORDER — OMEPRAZOLE MAGNESIUM 20 MG PO TBEC: DELAYED_RELEASE_TABLET | ORAL | Status: DC

## 2013-02-18 MED ORDER — METFORMIN HCL ER 500 MG PO TB24: ORAL_TABLET | ORAL | Status: DC

## 2013-02-18 NOTE — Progress Notes (Signed)
  Subjective:    Patient ID: Nathan Howard, male    DOB: 1941-05-20, 72 y.o.   MRN: [id number]  HPI The patient is here for followup of diabetes hyperlipidemia and hypertension.  The most recent A1c was 6.8 % , which correlates to an average sugar of 148  , and long-term risk of 36% . Fasting blood sugar  not monitored .  Two-hour postprandial glucose is  not monitored . No hypoglycemia unless prolonged fasting Ophthalmologic examination current revealed no retinopathy. No active podiatry assessment on record. Diet suboptimal as eating out. Exercise  irregular .  The most recent lipids 01/06/12  reveal LDL 103.5 ; HDL 31.5 ; and triglycerides 228   . There is medical compliance with the fenofibrate Blood pressure not monitored  . There is medical  compliance with antihypertensive medications  No  medication adverse effects suggested        Review of Systems Constitutional: No  significant weight change;  excess fatigue Eyes: Blurred vision only in am. No double vision or loss of vision Cardiovascular: no chest pain ;palpitations; racing; irregular rhythm ;syncope; claudication ; edema  Respiratory: No exertional dyspnea;  paroxysmal nocturnal dyspnea Musculoskeletal: No myalgias or muscle cramping  Dermatologic: No non healing  lesions; change in color or temperature of skin Neurologic: No numbness or tingling;  burning Endocrine: No change in hair, skin, nails. No excessive thirst; excessive hunger;  excessive urination      Objective:   Physical Exam Gen.:  well-nourished in appearance. Alert, appropriate and cooperative throughout exam.Appears younger than stated age  Head: Normocephalic without obvious abnormalities; no alopecia  Eyes: No corneal or conjunctival inflammation noted. Ptosis OS Mouth: Oral mucosa and oropharynx reveal no lesions or exudates. Teeth in good repair. Neck: No deformities, masses, or tenderness noted. Thyroid normal. Lungs: Normal  respiratory effort; chest expands symmetrically. Lungs : persistent rales LLL w/o wheezes, or increased work of breathing. Heart: Normal rate and rhythm. Normal S1 and S2. No gallop, click, or rub. No murmur. Abdomen: Bowel sounds normal; abdomen soft and nontender. No masses, organomegaly or hernias noted.                          Musculoskeletal/extremities: No deformity or scoliosis noted of  the thoracic or lumbar spine.  No clubbing, cyanosis, edema, or significant extremity  deformity noted. Tone & strength  Normal. Joints normal .Pes planus. Nail health good. Able to lie down & sit up w/o help. Vascular: Carotid, radial artery, dorsalis pedis and  posterior tibial pulses are full and equal. No bruits present. Neurologic: Alert and oriented x3. Deep tendon reflexes symmetrical and normal.        Skin: Intact without suspicious lesions or rashes. Lymph: No cervical, axillary lymphadenopathy present. Psych: Mood and affect are normal. Normally interactive                                                                                        Assessment & Plan:  #1 see problem list with updated recommendations  #2 abnormal breath sounds left lower lobe; chest x-ray will be ordered

## 2013-02-18 NOTE — Patient Instructions (Addendum)
Order for x-rays entered into  the computer; these will be performed at [address] across from [facility]. No appointment is necessary.  Review and correct the record as indicated. Please share record with all medical staff seen.  

## 2013-02-18 NOTE — Assessment & Plan Note (Signed)
Diabetic status will be updated; see lab request

## 2013-02-18 NOTE — Assessment & Plan Note (Signed)
Lipids and hepatic profile

## 2013-02-18 NOTE — Assessment & Plan Note (Signed)
BMET 

## 2013-02-22 ENCOUNTER — Ambulatory Visit (INDEPENDENT_AMBULATORY_CARE_PROVIDER_SITE_OTHER)
Admission: RE | Admit: 2013-02-22 | Discharge: 2013-02-22 | Disposition: A | Discharge status: Home / Self Care | Payer: Medicare Other | Source: Ambulatory Visit | Attending: Internal Medicine | Admitting: Internal Medicine

## 2013-02-22 DIAGNOSIS — R0989 Other specified symptoms and signs involving the circulatory and respiratory systems: Secondary | ICD-10-CM

## 2013-02-22 DIAGNOSIS — R0689 Other abnormalities of breathing: Secondary | ICD-10-CM

## 2013-04-05 ENCOUNTER — Encounter: Payer: Self-pay | Admitting: Internal Medicine

## 2013-04-05 ENCOUNTER — Ambulatory Visit (INDEPENDENT_AMBULATORY_CARE_PROVIDER_SITE_OTHER): Payer: Medicare (Managed Care) | Admitting: Internal Medicine

## 2013-04-05 VITALS — BP 124/76 | HR 72 | Temp 97.6°F | Resp 12 | Ht 70.08 in | Wt 220.0 lb

## 2013-04-05 DIAGNOSIS — I10 Essential (primary) hypertension: Secondary | ICD-10-CM

## 2013-04-05 DIAGNOSIS — N259 Disorder resulting from impaired renal tubular function, unspecified: Secondary | ICD-10-CM

## 2013-04-05 DIAGNOSIS — R9389 Abnormal findings on diagnostic imaging of other specified body structures: Secondary | ICD-10-CM

## 2013-04-05 DIAGNOSIS — E785 Hyperlipidemia, unspecified: Secondary | ICD-10-CM

## 2013-04-05 DIAGNOSIS — J8410 Pulmonary fibrosis, unspecified: Secondary | ICD-10-CM | POA: Insufficient documentation

## 2013-04-05 DIAGNOSIS — E1149 Type 2 diabetes mellitus with other diabetic neurological complication: Secondary | ICD-10-CM

## 2013-04-05 DIAGNOSIS — Z0000 Encounter for general adult medical examination without abnormal findings: Secondary | ICD-10-CM

## 2013-04-05 DIAGNOSIS — R918 Other nonspecific abnormal finding of lung field: Secondary | ICD-10-CM

## 2013-04-05 NOTE — Progress Notes (Signed)
Subjective:    Patient ID: Nathan Howard, male    DOB: [birth year], 72 y.o.   MRN: [id number]  HPI Medicare Wellness Visit:  Psychosocial & medical history were reviewed as required by Medicare (abuse,antisocial behavioral risks,firearm risk).  Social history: caffeine:3-4 cups coffee/ day & occasional tea  , alcohol: no  ,  tobacco use: quit 1963   Exercise : walking & yard work  No home & personal  safety / fall risk Activities of daily living: no limitations  Seatbelt  and smoke alarm employed. Power of Attorney/Living Will status : in place Ophthalmology exam current Hearing evaluation not current Orientation :oriented X 3  Memory & recall :good Math testing:good Mood & affect : normal . Depression / anxiety: denied Travel history : last in [place] remotely Immunization status : Shingles  needed Transfusion history:  none  Preventive health surveillance ( colonoscopy as per protocol/ [facility]): current  Dental care:  Every 6 mos. Chart reviewed &  Updated. Active issues reviewed & addressed.      Review of Systems He is not checking fasting glucoses. He's had an ophthalmology exam which revealed no diabetic retinopathy. He denies excessive thirst, hunger, or urination. Last A1c was 6.8% in 5/13.  Chest x-ray was performed 02/23/13 because of abnormal breath sounds. 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  He denies significant dyspnea, cough,or  sputum production.      Objective:   Physical Exam Gen.: Healthy and well-nourished in appearance. Alert, appropriate and cooperative throughout exam. Appears younger than stated age  Head: Normocephalic without obvious abnormalities; no alopecia  Eyes: No corneal or conjunctival inflammation noted.  Extraocular motion intact. Vision grossly normal with lenses Ears: External  ear exam reveals no significant lesions or deformities. Canals clear .TMs normal. Hearing is grossly normal  bilaterally. Nose: External nasal exam reveals no deformity or inflammation. Nasal mucosa are pink and moist. No lesions or exudates noted.  Mouth: Oral mucosa and oropharynx reveal no lesions or exudates. Teeth in good repair. Neck: No deformities, masses, or tenderness noted. Range of motion & Thyroid normal. Lungs: Normal respiratory effort; chest expands symmetrically. Dry rales rales LLL. Rales. No wheezes or increased work of breathing. Heart: Normal rate and rhythm. Normal S1 and S2. No gallop, click, or rub. S4 w/o murmur. Abdomen: Bowel sounds normal; abdomen soft and nontender. No masses, organomegaly or hernias noted. Genitalia: Genital exam is normal except for small left variceal. Digital rectal exam reveals no enlargement, nodularity, induration, or asymmetry.                        Musculoskeletal/extremities: No deformity or scoliosis noted of  the thoracic or lumbar spine.  No clubbing, cyanosis, edema, or significant extremity  deformity noted. Range of motion normal .Tone & strength  Normal. Pes planus.Joints normal . Nail health good. Able to lie down & sit up w/o help. Negative SLR bilaterally Vascular: Carotid, radial artery, dorsalis pedis and  posterior tibial pulses are full and equal. No bruits present. Neurologic: Alert and oriented x3. Deep tendon reflexes symmetrical and normal.         Skin: Intact without suspicious lesions or rashes. Lymph: No cervical, axillary lymphadenopathy present. Psych: Mood and affect are normal. Normally interactive  Assessment & Plan:  #1 Medicare Wellness Exam; criteria met ; data entered #2 Problem List reviewed ; Assessment/ Recommendations made Plan: see Orders

## 2013-04-05 NOTE — Patient Instructions (Addendum)
EKG is normal but there are MINOR ST-T changes of early repolarization. These are normal variants but could be mistaken for acute injury. This EKG should be available for comparison if  seen emergently.

## 2013-04-06 LAB — BASIC METABOLIC PANEL
CO2: 26 mEq/L (ref 19–32)
Glucose, Bld: 108 mg/dL — ABNORMAL HIGH (ref 70–99)
Potassium: 4.3 mEq/L (ref 3.5–5.1)
Sodium: 136 mEq/L (ref 135–145)

## 2013-04-06 LAB — MICROALBUMIN / CREATININE URINE RATIO
Creatinine,U: 52.9 mg/dL
Microalb Creat Ratio: 0.4 mg/g (ref 0.0–30.0)
Microalb, Ur: 0.2 mg/dL (ref 0.0–1.9)

## 2013-04-07 ENCOUNTER — Encounter: Payer: Self-pay | Admitting: *Deleted

## 2013-07-28 ENCOUNTER — Other Ambulatory Visit: Payer: Self-pay | Admitting: Dermatology

## 2013-08-18 ENCOUNTER — Other Ambulatory Visit: Payer: Self-pay | Admitting: *Deleted

## 2013-08-18 DIAGNOSIS — E119 Type 2 diabetes mellitus without complications: Secondary | ICD-10-CM

## 2013-08-18 MED ORDER — ATENOLOL 25 MG PO TABS: ORAL_TABLET | ORAL | Status: DC

## 2013-08-18 MED ORDER — OMEPRAZOLE MAGNESIUM 20 MG PO TBEC: DELAYED_RELEASE_TABLET | ORAL | Status: DC

## 2013-08-18 MED ORDER — GLIMEPIRIDE 2 MG PO TABS: ORAL_TABLET | ORAL | Status: DC

## 2013-08-18 MED ORDER — METFORMIN HCL ER 500 MG PO TB24: ORAL_TABLET | ORAL | Status: DC

## 2013-08-18 NOTE — Telephone Encounter (Signed)
Rx was filled for metformin,atenolol,glimepiride,omeprazole.  Ag cma

## 2013-10-10 ENCOUNTER — Telehealth: Payer: Self-pay | Admitting: Internal Medicine

## 2013-10-10 DIAGNOSIS — E119 Type 2 diabetes mellitus without complications: Secondary | ICD-10-CM

## 2013-10-10 NOTE — Telephone Encounter (Signed)
10/10/2013  Pt came by office today.  He states he needs his A1C rechecked, but also says he is not sure if he needs any other blood work at this time.  Please call him when orders are put in and make appt for the blood work.   Please advise.  bw

## 2013-10-10 NOTE — Telephone Encounter (Signed)
Future orders placed for Hgb A1c, no other blood work is appropriate at this time.

## 2013-10-11 ENCOUNTER — Other Ambulatory Visit (INDEPENDENT_AMBULATORY_CARE_PROVIDER_SITE_OTHER): Payer: Medicare (Managed Care)

## 2013-10-11 ENCOUNTER — Ambulatory Visit (INDEPENDENT_AMBULATORY_CARE_PROVIDER_SITE_OTHER): Payer: Medicare (Managed Care)

## 2013-10-11 DIAGNOSIS — E119 Type 2 diabetes mellitus without complications: Secondary | ICD-10-CM

## 2013-10-11 DIAGNOSIS — Z23 Encounter for immunization: Secondary | ICD-10-CM

## 2013-10-12 LAB — HEMOGLOBIN A1C: Hgb A1c MFr Bld: 7.3 % — ABNORMAL HIGH (ref 4.6–6.5)

## 2013-10-14 ENCOUNTER — Encounter: Payer: Self-pay | Admitting: General Practice

## 2013-11-10 ENCOUNTER — Other Ambulatory Visit: Payer: Self-pay | Admitting: Internal Medicine

## 2013-11-10 NOTE — Telephone Encounter (Signed)
Fenofibrate refilled per protocol

## 2013-12-01 ENCOUNTER — Encounter: Payer: Self-pay | Admitting: Internal Medicine

## 2013-12-01 ENCOUNTER — Ambulatory Visit (INDEPENDENT_AMBULATORY_CARE_PROVIDER_SITE_OTHER): Payer: Medicare (Managed Care) | Admitting: Internal Medicine

## 2013-12-01 VITALS — BP 125/70 | HR 65 | Temp 98.2°F | Ht 70.5 in | Wt 219.4 lb

## 2013-12-01 DIAGNOSIS — Z860100 Personal history of colon polyps, unspecified: Secondary | ICD-10-CM

## 2013-12-01 DIAGNOSIS — S39012A Strain of muscle, fascia and tendon of lower back, initial encounter: Secondary | ICD-10-CM

## 2013-12-01 DIAGNOSIS — N259 Disorder resulting from impaired renal tubular function, unspecified: Secondary | ICD-10-CM

## 2013-12-01 DIAGNOSIS — E785 Hyperlipidemia, unspecified: Secondary | ICD-10-CM

## 2013-12-01 DIAGNOSIS — R9389 Abnormal findings on diagnostic imaging of other specified body structures: Secondary | ICD-10-CM

## 2013-12-01 DIAGNOSIS — R918 Other nonspecific abnormal finding of lung field: Secondary | ICD-10-CM

## 2013-12-01 DIAGNOSIS — E1129 Type 2 diabetes mellitus with other diabetic kidney complication: Secondary | ICD-10-CM

## 2013-12-01 DIAGNOSIS — Z0000 Encounter for general adult medical examination without abnormal findings: Secondary | ICD-10-CM

## 2013-12-01 DIAGNOSIS — D518 Other vitamin B12 deficiency anemias: Secondary | ICD-10-CM

## 2013-12-01 DIAGNOSIS — I10 Essential (primary) hypertension: Secondary | ICD-10-CM

## 2013-12-01 DIAGNOSIS — S335XXA Sprain of ligaments of lumbar spine, initial encounter: Secondary | ICD-10-CM

## 2013-12-01 NOTE — Progress Notes (Signed)
Subjective:    Patient ID: Nathan Howard, male    DOB: [birth year], 72 y.o.   MRN: [id number]  HPI Medicare Wellness Visit: Psychosocial and medical history were reviewed as required by Medicare (history related to abuse, antisocial behavior , firearm risk). Social history: Caffeine: 2-5 cups coffee  daily , Alcohol: no , Tobacco use: quit 1963 Exercise: walking but not regularly Personal safety/fall risk: No Limitations of activities of daily living: No Seatbelt/ smoke alarm use: Yes Healthcare Power of Attorney/Living Will status: Yes Ophthalmologic exam status: Yes; 09/2013 Hearing evaluation status:not current Orientation: Oriented X 3 Memory and recall: good Math testing: good Depression/anxiety assessment: No Foreign travel history: None Immunization status for influenza/pneumonia/ shingles /tetanus: UTD with exception shingles vaccine Transfusion history: None Preventive health care maintenance status: Colonoscopy as per protocol/standard care: Dr. Jarold Motto; UTD Dental care: Scheduled Chart reviewed and updated. Active issues reviewed and addressed as documented below.    Review of Systems  Blood pressure average is 120/70 at home. He has been compliant with his BP medicines. He denies any adverse effects related to the blood pressure meds  He is on no specific diet but is avoiding high fructose corn syrup sugar. He is not exercising. He denies any hypoglycemic episodes  He does describe urinary urgency at times without polydipsia, polyuria, or polyphagia.  He has no nonhealing skin lesions  He denies any blurred vision, double vision, loss of vision.  He denies myalgias specifically but does have low back pain related to recent strain. This has improved with Aleve and rest.  He has no dysuria, pyuria, or hematuria.     Objective:   Physical Exam Gen.: Healthy and well-nourished in appearance. Alert, appropriate and cooperative throughout exam.Appears younger  than stated age  Head: Normocephalic without obvious abnormalities; no alopecia  Eyes: No corneal or conjunctival inflammation noted. Pupils equal round reactive to light and accommodation. Extraocular motion intact.  Ears: External  ear exam reveals no significant lesions or deformities. Canals clear .TMs normal. Hearing is grossly normal bilaterally. Nose: External nasal exam reveals no deformity or inflammation. Nasal mucosa are pink and moist. No lesions or exudates noted.   Mouth: Oral mucosa and oropharynx reveal no lesions or exudates. Teeth in good repair. Neck: No deformities, masses, or tenderness noted. Range of motion & Thyroid normal. Lungs: Normal respiratory effort; chest expands symmetrically. Lungs reveal bibasilar  Rales w/o wheezes or increased work of breathing. Heart: Normal rate and rhythm. Normal S1 and S2. No gallop, click, or rub. No murmur. Abdomen: Bowel sounds normal; abdomen soft and nontender. No masses, organomegaly or hernias noted. Genitalia: deferred due to age & S/P colonoscopy  status                               Musculoskeletal/extremities: No deformity or scoliosis noted of  the thoracic or lumbar spine.   No clubbing, cyanosis, edema, or significant extremity  deformity noted. Range of motion normal .Tone & strength normal. Hand joints normal . Fingernail / toenail health good. Pes planus Able to lie down & sit up w/o help. Negative SLR bilaterally Vascular: Carotid, radial artery, dorsalis pedis and  posterior tibial pulses are full and equal. No bruits present. Neurologic: Alert and oriented x3. Deep tendon reflexes symmetrical and normal.   Light touch normal over feet.      Skin: Intact without suspicious lesions or rashes. Lymph: No cervical, axillary lymphadenopathy present. Psych:  Mood and affect are normal. Normally interactive                                                                                        Assessment & Plan:  #1  Medicare Wellness Exam; criteria met ; data entered #2 Problem List/Diagnoses reviewed #3 LBS , resolving. No evidence of acute disc issues Plan:  Assessments made/ Orders entered

## 2013-12-01 NOTE — Patient Instructions (Addendum)
Your next office appointment will be determined based upon review of your pending labs & x-rays. Those instructions will be transmitted to you  by mail . The best exercises for the low back include freestyle swimming, stretch aerobics, and yoga.

## 2013-12-08 ENCOUNTER — Telehealth: Payer: Self-pay | Admitting: *Deleted

## 2013-12-08 DIAGNOSIS — Z0000 Encounter for general adult medical examination without abnormal findings: Secondary | ICD-10-CM

## 2013-12-08 DIAGNOSIS — D518 Other vitamin B12 deficiency anemias: Secondary | ICD-10-CM

## 2013-12-08 DIAGNOSIS — E1129 Type 2 diabetes mellitus with other diabetic kidney complication: Secondary | ICD-10-CM

## 2013-12-08 DIAGNOSIS — I10 Essential (primary) hypertension: Secondary | ICD-10-CM

## 2013-12-08 DIAGNOSIS — E785 Hyperlipidemia, unspecified: Secondary | ICD-10-CM

## 2013-12-08 NOTE — Telephone Encounter (Signed)
Message copied by Verdie Shire on Thu Dec 08, 2013 11:47 AM ------      Message from: Pecola Lawless      Created: Tue Dec 06, 2013 12:35 PM       Please  schedule fasting Labs as entered 12/11 as "FUTURE" , here or @ [facility] . Never completed. Thanks, Hopp ------

## 2013-12-08 NOTE — Telephone Encounter (Signed)
Spoke with the pt and he stated that he will be going next week to [facility] to have his Chest x-ray and labs done.  Future labs ordered and sent.//AB/CMA

## 2013-12-12 ENCOUNTER — Ambulatory Visit (INDEPENDENT_AMBULATORY_CARE_PROVIDER_SITE_OTHER)
Admission: RE | Admit: 2013-12-12 | Discharge: 2013-12-12 | Disposition: A | Discharge status: Home / Self Care | Payer: Medicare (Managed Care) | Source: Ambulatory Visit | Attending: Internal Medicine | Admitting: Internal Medicine

## 2013-12-12 ENCOUNTER — Other Ambulatory Visit (INDEPENDENT_AMBULATORY_CARE_PROVIDER_SITE_OTHER): Payer: Medicare (Managed Care)

## 2013-12-12 DIAGNOSIS — D518 Other vitamin B12 deficiency anemias: Secondary | ICD-10-CM

## 2013-12-12 DIAGNOSIS — I10 Essential (primary) hypertension: Secondary | ICD-10-CM

## 2013-12-12 DIAGNOSIS — E785 Hyperlipidemia, unspecified: Secondary | ICD-10-CM

## 2013-12-12 DIAGNOSIS — R9389 Abnormal findings on diagnostic imaging of other specified body structures: Secondary | ICD-10-CM

## 2013-12-12 DIAGNOSIS — E1129 Type 2 diabetes mellitus with other diabetic kidney complication: Secondary | ICD-10-CM

## 2013-12-12 DIAGNOSIS — Z0000 Encounter for general adult medical examination without abnormal findings: Secondary | ICD-10-CM

## 2013-12-12 DIAGNOSIS — R918 Other nonspecific abnormal finding of lung field: Secondary | ICD-10-CM

## 2013-12-12 DIAGNOSIS — E119 Type 2 diabetes mellitus without complications: Secondary | ICD-10-CM

## 2013-12-12 LAB — CBC WITH DIFFERENTIAL/PLATELET
Basophils Absolute: 0 K/uL (ref 0.0–0.1)
Basophils Relative: 0.4 % (ref 0.0–3.0)
Eosinophils Absolute: 0.2 K/uL (ref 0.0–0.7)
Eosinophils Relative: 3.7 % (ref 0.0–5.0)
HCT: 39.3 % (ref 39.0–52.0)
Hemoglobin: 13.2 g/dL (ref 13.0–17.0)
Lymphocytes Relative: 32.8 % (ref 12.0–46.0)
Lymphs Abs: 1.9 K/uL (ref 0.7–4.0)
MCHC: 33.6 g/dL (ref 30.0–36.0)
MCV: 88.5 fl (ref 78.0–100.0)
Monocytes Absolute: 0.3 K/uL (ref 0.1–1.0)
Monocytes Relative: 5.7 % (ref 3.0–12.0)
Neutro Abs: 3.3 K/uL (ref 1.4–7.7)
Neutrophils Relative %: 57.4 % (ref 43.0–77.0)
Platelets: 285 K/uL (ref 150.0–400.0)
RBC: 4.44 Mil/uL (ref 4.22–5.81)
RDW: 13.9 % (ref 11.5–14.6)
WBC: 5.8 K/uL (ref 4.5–10.5)

## 2013-12-12 LAB — LIPID PANEL
Cholesterol: 152 mg/dL (ref 0–200)
HDL: 27.2 mg/dL — ABNORMAL LOW
Total CHOL/HDL Ratio: 6
Triglycerides: 222 mg/dL — ABNORMAL HIGH (ref 0.0–149.0)
VLDL: 44.4 mg/dL — ABNORMAL HIGH (ref 0.0–40.0)

## 2013-12-12 LAB — TSH: TSH: 3.82 u[IU]/mL (ref 0.35–5.50)

## 2013-12-12 LAB — HEPATIC FUNCTION PANEL
ALT: 19 U/L (ref 0–53)
Albumin: 4.5 g/dL (ref 3.5–5.2)
Alkaline Phosphatase: 33 U/L — ABNORMAL LOW (ref 39–117)
Total Protein: 8 g/dL (ref 6.0–8.3)

## 2013-12-12 LAB — BASIC METABOLIC PANEL WITH GFR
BUN: 21 mg/dL (ref 6–23)
CO2: 28 meq/L (ref 19–32)
Calcium: 9.7 mg/dL (ref 8.4–10.5)
Chloride: 102 meq/L (ref 96–112)
Creatinine, Ser: 1.6 mg/dL — ABNORMAL HIGH (ref 0.4–1.5)
GFR: 46.71 mL/min — ABNORMAL LOW
Glucose, Bld: 143 mg/dL — ABNORMAL HIGH (ref 70–99)
Potassium: 4.5 meq/L (ref 3.5–5.1)
Sodium: 137 meq/L (ref 135–145)

## 2013-12-12 LAB — LDL CHOLESTEROL, DIRECT: Direct LDL: 100.7 mg/dL

## 2013-12-13 ENCOUNTER — Encounter: Payer: Self-pay | Admitting: *Deleted

## 2013-12-14 ENCOUNTER — Encounter: Payer: Self-pay | Admitting: *Deleted

## 2013-12-14 ENCOUNTER — Other Ambulatory Visit: Payer: Self-pay | Admitting: Internal Medicine

## 2013-12-14 DIAGNOSIS — E1129 Type 2 diabetes mellitus with other diabetic kidney complication: Secondary | ICD-10-CM

## 2014-02-08 ENCOUNTER — Other Ambulatory Visit: Payer: Self-pay | Admitting: Internal Medicine

## 2014-02-14 ENCOUNTER — Ambulatory Visit: Payer: Medicare (Managed Care) | Admitting: Internal Medicine

## 2014-02-16 ENCOUNTER — Ambulatory Visit: Payer: Medicare (Managed Care) | Admitting: Internal Medicine

## 2014-02-16 DIAGNOSIS — Z0289 Encounter for other administrative examinations: Secondary | ICD-10-CM

## 2014-04-11 ENCOUNTER — Ambulatory Visit (INDEPENDENT_AMBULATORY_CARE_PROVIDER_SITE_OTHER): Payer: Medicare (Managed Care) | Admitting: Internal Medicine

## 2014-04-11 ENCOUNTER — Other Ambulatory Visit (INDEPENDENT_AMBULATORY_CARE_PROVIDER_SITE_OTHER): Payer: Medicare (Managed Care)

## 2014-04-11 ENCOUNTER — Encounter: Payer: Self-pay | Admitting: Internal Medicine

## 2014-04-11 VITALS — BP 146/80 | HR 70 | Temp 98.2°F | Resp 14 | Ht 69.5 in | Wt 216.4 lb

## 2014-04-11 DIAGNOSIS — R0689 Other abnormalities of breathing: Secondary | ICD-10-CM

## 2014-04-11 DIAGNOSIS — E1129 Type 2 diabetes mellitus with other diabetic kidney complication: Secondary | ICD-10-CM

## 2014-04-11 DIAGNOSIS — R0989 Other specified symptoms and signs involving the circulatory and respiratory systems: Secondary | ICD-10-CM

## 2014-04-11 DIAGNOSIS — R9389 Abnormal findings on diagnostic imaging of other specified body structures: Secondary | ICD-10-CM

## 2014-04-11 DIAGNOSIS — R918 Other nonspecific abnormal finding of lung field: Secondary | ICD-10-CM

## 2014-04-11 DIAGNOSIS — E119 Type 2 diabetes mellitus without complications: Secondary | ICD-10-CM

## 2014-04-11 LAB — BASIC METABOLIC PANEL
BUN: 19 mg/dL (ref 6–23)
CO2: 26 mEq/L (ref 19–32)
Calcium: 9.7 mg/dL (ref 8.4–10.5)
Chloride: 103 mEq/L (ref 96–112)
Creatinine, Ser: 1.5 mg/dL (ref 0.4–1.5)
GFR: 50.01 mL/min — ABNORMAL LOW (ref >60.00)
Glucose, Bld: 133 mg/dL — ABNORMAL HIGH (ref 70–99)
POTASSIUM: 4.5 meq/L (ref 3.5–5.1)
SODIUM: 138 meq/L (ref 135–145)

## 2014-04-11 LAB — MICROALBUMIN / CREATININE URINE RATIO
Creatinine,U: 60.6 mg/dL
MICROALB UR: 0.5 mg/dL (ref 0.0–1.9)
MICROALB/CREAT RATIO: 0.8 mg/g (ref 0.0–30.0)

## 2014-04-11 LAB — HEMOGLOBIN A1C: HEMOGLOBIN A1C: 7 % — AB (ref 4.6–6.5)

## 2014-04-11 MED ORDER — LOSARTAN POTASSIUM 100 MG PO TABS: ORAL_TABLET | ORAL | Status: DC

## 2014-04-11 NOTE — Progress Notes (Signed)
Pre visit review using our clinic review tool, if applicable. No additional management support is needed unless otherwise documented below in the visit note. 

## 2014-04-11 NOTE — Progress Notes (Signed)
   Subjective:    Patient ID: Nathan Howard, male    DOB: 06/16/1941, 73 y.o.   MRN: [id number]  HPI  He presents today for 79-month follow up on hyperlipidemia, DM, and hypertension.  He is in no acute distress or pain at this time.   HYPERTENSION: Disease Monitoring: not monitored at home Blood pressure range/ average :  Medication Compliance: Losartan, atenolol  FASTING HYPERGLYCEMIA OR Diabetes : A1C on 12/12/14, 6.9  FBS range/average: not measured. Highest 2 hr post meal glucose: not measured Medication compliance: glimepiride, metformin Hypoglycemia: none reported Ophthamology care: 2014, no diabetic changes noted. [facility]  Podiatry care: none   HYPERLIPIDEMIA: Disease Monitoring: 12/12/13, total cholesterol: 152, Tri 222, HDL 27.2, LDL 44.4 Medication Compliance: Fenofibrate at night   Review of Systems Chest pain, palpitations: denies   Dyspnea: denies Edema: ankles with increased standing, resolves overnight Claudication: denies Lightheadedness,Syncope: denies  Weight gain/loss: denies Polyuria/phagia/dipsia: frequent urination; 3 times up during night Blurred vision /diplopia/lossof vision: denies Limb numbness/tingling/burning: L cataract, no pending surgery Non healing skin lesions: none Abd pain, bowel changes: infrequent constipation   Myalgias: none Memory loss: none    Objective:   Physical Exam General appearance is one of good health and nourishment w/o distress.  Eyes: No conjunctival inflammation or scleral icterus is present.  Oral exam: Dental hygiene is good; lips and gums are healthy appearing.There is no oropharyngeal erythema or exudate noted.   Heart:  Normal rate and regular rhythm. S1 and S2 normal without gallop, murmur, click, rub or other extra sounds    Lungs:diffuse crackles at in lower lobe; no wheezes, rhonchi,rales ,or rubs present.No increased work of breathing.   Abdomen: bowel sounds normal, soft and  non-tender without masses, organomegaly or hernias noted.  No guarding or rebound . No tenderness over the flanks to percussion  Musculoskeletal: Able to lie flat and sit up without help. Negative straight leg raising bilaterally. Gait normal  Skin:Warm & dry.  Intact without suspicious lesions or rashes ; no jaundice or tenting  Lymphatic: No lymphadenopathy is noted about the head, neck, axilla, or inguinal areas.      Assessment & Plan:  #1 hyperlipidemia - lipids,TSH #2 T2DM - A1C #3 hypertension - BMP #4 diffuse crackles, LL lobe - PFTs after CXR 12/12/13 revealed increased interstitial markings

## 2014-04-11 NOTE — Patient Instructions (Signed)
The lung function tests referral will be scheduled and you'll be notified of the time.

## 2014-04-11 NOTE — Progress Notes (Signed)
   Subjective:    Patient ID: Nathan Howard, male    DOB: [birth year], 73 y.o.   MRN: [id number]  HPI He presents today for 35-month follow up on hyperlipidemia, DM, and hypertension.  He is in no acute distress or pain at this time.   HYPERTENSION:  Disease Monitoring: not monitored at home  Blood pressure range/ average :  Medication Compliance: Losartan, atenolol   FASTING HYPERGLYCEMIA OR Diabetes : A1C on 12/12/14, 6.9  FBS range/average: not measured.  Highest 2 hr post meal glucose: not measured  Medication compliance: glimepiride, metformin  Hypoglycemia: none reported  Ophthamology care: 2014, no diabetic changes noted. [facility]  Podiatry care: none    HYPERLIPIDEMIA:  Disease Monitoring: 12/12/13, total cholesterol: 152, Tri 222, HDL 27.2, LDL 44.4  Medication Compliance: Fenofibrate at night    Review of Systems Chest pain, palpitations: denies  Dyspnea: denies  Edema: ankles with increased standing, resolves overnight  Claudication: denies  Lightheadedness,Syncope: denies  Weight gain/loss: denies  Polyuria/phagia/dipsia: frequent urination; 3 times up during night  Blurred vision /diplopia/lossof vision: denies  Limb numbness/tingling/burning: L cataract, no pending surgery  Non healing skin lesions: none  Abd pain, bowel changes: infrequent constipation  Myalgias: none  Memory loss: none       Objective:   Physical Exam General appearance is one of good health and nourishment w/o distress.  Eyes: No conjunctival inflammation or scleral icterus is present.  Oral exam: Dental hygiene is good; lips and gums are healthy appearing.There is no oropharyngeal erythema or exudate noted.  Heart: Normal rate and regular rhythm. S1 and S2 normal without gallop, murmur, click, rub or other extra sounds  Lungs:diffuse crackles at in lower lobe; no wheezes, rhonchi,or rubs present.No increased work of breathing.  Abdomen: bowel sounds normal, soft  and non-tender without masses, organomegaly or hernias noted. No guarding or rebound.  Musculoskeletal: Able to lie flat and sit up without help. Negative straight leg raising bilaterally. Gait normal. Skin:Warm & dry. Intact without suspicious lesions or rashes ; no jaundice or tenting. Lymphatic: No lymphadenopathy is noted about the head, neck, axilla areas.       Assessment & Plan:  #1 DM , assess status #2hyperlipidemia - lipids improved & current  #3 hypertension - home monitor recommended  #4 diffuse crackles, LL lobe - schedule reccommended PFTs after CXR 12/12/13 revealed increased interstitial markings

## 2014-04-14 ENCOUNTER — Other Ambulatory Visit: Payer: Self-pay | Admitting: Internal Medicine

## 2014-04-14 DIAGNOSIS — E1129 Type 2 diabetes mellitus with other diabetic kidney complication: Secondary | ICD-10-CM

## 2014-05-24 ENCOUNTER — Other Ambulatory Visit: Payer: Self-pay | Admitting: Internal Medicine

## 2015-02-08 ENCOUNTER — Other Ambulatory Visit: Payer: Self-pay | Admitting: Internal Medicine

## 2015-02-12 ENCOUNTER — Ambulatory Visit (INDEPENDENT_AMBULATORY_CARE_PROVIDER_SITE_OTHER): Payer: Medicare Other | Admitting: Internal Medicine

## 2015-02-12 ENCOUNTER — Other Ambulatory Visit (INDEPENDENT_AMBULATORY_CARE_PROVIDER_SITE_OTHER): Payer: Medicare Other

## 2015-02-12 ENCOUNTER — Encounter: Payer: Self-pay | Admitting: Internal Medicine

## 2015-02-12 ENCOUNTER — Ambulatory Visit (INDEPENDENT_AMBULATORY_CARE_PROVIDER_SITE_OTHER)
Admission: RE | Admit: 2015-02-12 | Discharge: 2015-02-12 | Disposition: A | Discharge status: Home / Self Care | Payer: Medicare Other | Source: Ambulatory Visit | Attending: Internal Medicine | Admitting: Internal Medicine

## 2015-02-12 VITALS — BP 140/86 | HR 68 | Temp 97.9°F | Ht 70.0 in | Wt 213.8 lb

## 2015-02-12 DIAGNOSIS — IMO0002 Reserved for concepts with insufficient information to code with codable children: Secondary | ICD-10-CM

## 2015-02-12 DIAGNOSIS — R059 Cough, unspecified: Secondary | ICD-10-CM

## 2015-02-12 DIAGNOSIS — E1165 Type 2 diabetes mellitus with hyperglycemia: Secondary | ICD-10-CM

## 2015-02-12 DIAGNOSIS — R05 Cough: Secondary | ICD-10-CM

## 2015-02-12 DIAGNOSIS — J310 Chronic rhinitis: Secondary | ICD-10-CM

## 2015-02-12 DIAGNOSIS — E1129 Type 2 diabetes mellitus with other diabetic kidney complication: Secondary | ICD-10-CM

## 2015-02-12 DIAGNOSIS — R9389 Abnormal findings on diagnostic imaging of other specified body structures: Secondary | ICD-10-CM

## 2015-02-12 DIAGNOSIS — R938 Abnormal findings on diagnostic imaging of other specified body structures: Secondary | ICD-10-CM

## 2015-02-12 LAB — MICROALBUMIN / CREATININE URINE RATIO
Creatinine,U: 55.4 mg/dL
MICROALB/CREAT RATIO: 1.3 mg/g (ref 0.0–30.0)
Microalb, Ur: <0.7 mg/dL (ref 0.0–1.9)

## 2015-02-12 LAB — CBC WITH DIFFERENTIAL/PLATELET
BASOS ABS: 0 10*3/uL (ref 0.0–0.1)
Basophils Relative: 0.7 % (ref 0.0–3.0)
EOS PCT: 3.9 % (ref 0.0–5.0)
Eosinophils Absolute: 0.2 10*3/uL (ref 0.0–0.7)
HCT: 41.4 % (ref 39.0–52.0)
Hemoglobin: 13.8 g/dL (ref 13.0–17.0)
Lymphocytes Relative: 32.7 % (ref 12.0–46.0)
Lymphs Abs: 2 10*3/uL (ref 0.7–4.0)
MCHC: 33.4 g/dL (ref 30.0–36.0)
MCV: 87.3 fl (ref 78.0–100.0)
Monocytes Absolute: 0.4 10*3/uL (ref 0.1–1.0)
Monocytes Relative: 5.9 % (ref 3.0–12.0)
NEUTROS ABS: 3.5 10*3/uL (ref 1.4–7.7)
NEUTROS PCT: 56.8 % (ref 43.0–77.0)
PLATELETS: 235 10*3/uL (ref 150.0–400.0)
RBC: 4.74 Mil/uL (ref 4.22–5.81)
RDW: 14.1 % (ref 11.5–15.5)
WBC: 6.2 10*3/uL (ref 4.0–10.5)

## 2015-02-12 LAB — BASIC METABOLIC PANEL
BUN: 17 mg/dL (ref 6–23)
CO2: 26 meq/L (ref 19–32)
CREATININE: 1.35 mg/dL (ref 0.40–1.50)
Calcium: 10 mg/dL (ref 8.4–10.5)
Chloride: 103 mEq/L (ref 96–112)
GFR: 54.61 mL/min — AB (ref >60.00)
GLUCOSE: 145 mg/dL — AB (ref 70–99)
POTASSIUM: 4.6 meq/L (ref 3.5–5.1)
SODIUM: 137 meq/L (ref 135–145)

## 2015-02-12 LAB — HEMOGLOBIN A1C: Hgb A1c MFr Bld: 7.1 % — ABNORMAL HIGH (ref 4.6–6.5)

## 2015-02-12 MED ORDER — ATENOLOL 25 MG PO TABS: ORAL_TABLET | ORAL | Status: DC

## 2015-02-12 MED ORDER — GLIMEPIRIDE 2 MG PO TABS: ORAL_TABLET | ORAL | Status: DC

## 2015-02-12 MED ORDER — BENZONATATE 200 MG PO CAPS: 200.0000 mg | ORAL_CAPSULE | Freq: Three times a day (TID) | ORAL | Status: DC | PRN

## 2015-02-12 NOTE — Assessment & Plan Note (Signed)
A1c , urine microalbumin, BMET 

## 2015-02-12 NOTE — Assessment & Plan Note (Signed)
CXR

## 2015-02-12 NOTE — Patient Instructions (Signed)
Plain Mucinex (NOT D) for thick secretions ;force NON dairy fluids .   Nasal cleansing in the shower as discussed with lather of mild shampoo.After 10 seconds wash off lather while  exhaling through nostrils. Make sure that all residual soap is removed to prevent irritation.  Flonase OR Nasacort AQ 1 spray in each nostril twice a day as needed. Use the "crossover" technique into opposite nostril spraying toward opposite ear @ 45 degree angle, not straight up into nostril.  Plain Allegra (NOT D )  160 daily , Loratidine 10 mg , OR Zyrtec 10 mg @ bedtime  as needed for itchy eyes & sneezing.   Your next office appointment will be determined based upon review of your pending labs AND x-rays. Those instructions will be transmitted to you by mail. Critical values will be called. Followup as needed for any active or acute issue. Please report any significant change in your symptoms.

## 2015-02-12 NOTE — Progress Notes (Signed)
Pre visit review using our clinic review tool, if applicable. No additional management support is needed unless otherwise documented below in the visit note. 

## 2015-02-12 NOTE — Progress Notes (Signed)
   Subjective:    Patient ID: Nathan Howard, male    DOB: [birth year], 74 y.o.   MRN: [id number]  HPI  He is here for cough; he is concerned as he has had changes on his x-ray which suggested possible pulmonary fibrosis.He has had some DOE and was even dyspneic singing in church 02/11/15.  His symptoms started as a "cold" before Christmas. At that time he had fever, chills, sweats. He also has some pleuritic chest pain with the cough. He was Coricidin HBP with some response.  During the period December-January he had been on clindamycin for dental work. He took a total of 40 pills.  At this time his major symptoms are nasal congestion with clear drainage. He rarely has scant yellow discharge.  Cough is nonproductive with paroxysms lasting seconds. Rarely he produces light gray sputum.   No glucose monitoring; dietary indiscretion during the holidays.A1c 7% in 03/2014. No regular exercise but he did shovel snow during the recent snowstorm.  Review of Systems At this time he denies fever, chills, sweats.  Despite the clindamycin he's had no diarrhea.  He denies frontal headache, facial pain, significant nasal purulence. He has no otic pain or otic discharge.     Objective:   Physical Exam  Pertinent positive findings include: He has bilateral ptosis. There are rales at the bases; greater on the left than the right Heart sounds are distant. Ventral hernias present. Pace planus is noted. He has no cyanosis or clubbing of the nailbeds.   General appearance :adequately nourished; in no distress. Eyes: No conjunctival inflammation or scleral icterus is present. Oral exam: Dental hygiene is good. Lips and gums are healthy appearing.There is no oropharyngeal erythema or exudate noted.  Heart:  Normal rate and regular rhythm. S1 and S2 normal without gallop, murmur, click, rub or other extra sounds   Lungs: no wheezes, rhonchi,or rubs present.No increased work of breathing.  Abdomen:  bowel sounds normal, soft and non-tender without masses, or organomegaly  noted.  No guarding or rebound.  Vascular : all pulses equal ; no bruits present. Skin:Warm & dry.  Intact without suspicious lesions or rashes ; no jaundice or tenting Lymphatic: No lymphadenopathy is noted about the head, neck, axilla Neuro: Strength, tone  normal.     Assessment & Plan:  #1 cough in the context of #2 &  #3  #2 nonallergic rhinitis  #3 abnormal chest x-ray; rule out pulmonary fibrosis.  #4 diabetes, questionable status  Plan: See orders & recommendations

## 2015-02-13 ENCOUNTER — Other Ambulatory Visit: Payer: Self-pay | Admitting: Internal Medicine

## 2015-02-13 ENCOUNTER — Telehealth: Payer: Self-pay

## 2015-02-13 DIAGNOSIS — R0600 Dyspnea, unspecified: Secondary | ICD-10-CM

## 2015-02-13 DIAGNOSIS — R9389 Abnormal findings on diagnostic imaging of other specified body structures: Secondary | ICD-10-CM

## 2015-02-13 NOTE — Telephone Encounter (Signed)
Opened in error

## 2015-02-22 ENCOUNTER — Institutional Professional Consult (permissible substitution): Payer: Medicare Other | Admitting: Internal Medicine

## 2015-02-26 ENCOUNTER — Encounter: Payer: Self-pay | Admitting: Internal Medicine

## 2015-02-26 ENCOUNTER — Other Ambulatory Visit (INDEPENDENT_AMBULATORY_CARE_PROVIDER_SITE_OTHER): Payer: Medicare Other

## 2015-02-26 ENCOUNTER — Ambulatory Visit (INDEPENDENT_AMBULATORY_CARE_PROVIDER_SITE_OTHER): Payer: Medicare Other | Admitting: Internal Medicine

## 2015-02-26 VITALS — BP 142/80 | HR 70 | Ht 70.0 in | Wt 213.0 lb

## 2015-02-26 DIAGNOSIS — R938 Abnormal findings on diagnostic imaging of other specified body structures: Secondary | ICD-10-CM

## 2015-02-26 DIAGNOSIS — R9389 Abnormal findings on diagnostic imaging of other specified body structures: Secondary | ICD-10-CM

## 2015-02-26 NOTE — Patient Instructions (Addendum)
Change prilosec to omeprazole 40 mg Take 30-60 min before first meal of the day and Pepcid ac 20 at bedtime until return  GERD (REFLUX)  is an extremely common cause of respiratory symptoms just like yours , many times with no obvious heartburn at all.    It can be treated with medication, but also with lifestyle changes including avoidance of late meals, excessive alcohol, smoking cessation, and avoid fatty foods, chocolate, peppermint, colas, red wine, and acidic juices such as orange juice.  NO MINT OR MENTHOL PRODUCTS SO NO COUGH DROPS  USE SUGARLESS CANDY INSTEAD (Jolley ranchers or Stover's or Life Savers) or even ice chips will also do - the key is to swallow to prevent all throat clearing. NO OIL BASED VITAMINS - use powdered substitutes.    Please remember to go to the lab department downstairs for your tests - we will call you with the results when they are available.  Please schedule a follow up office visit in 6 weeks, call sooner if needed

## 2015-02-26 NOTE — Assessment & Plan Note (Addendum)
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 -Smoking history encompassed less than 5 years. He's had no significant occupational exposures - 02/26/2015  Walked RA x 3 laps @ 185 ft each stopped due to  End of study , nl pace, no desats   DDx for pulmonary fibrosis  includes idiopathic pulmonary fibrosis, pulmonary fibrosis associated with rheumatologic diseases (which have a relatively benign course in most cases and so sent studies today), adverse effect from  drugs such as chemotherapy or amiodarone exposure, nonspecific interstitial pneumonia which is typically steroid responsive, and chronic hypersensitivity pneumonitis.  Residual from ALI related to tooth infection or asp related to infected tooth worth considering here also.   In active  smokers Langerhan's Cell  Histiocyctosis (eosinophilic granuomatosis),  DIP,  and Respiratory Bronchiolitis ILD also need to be considered, but I don't believe apply here  Use of PPI is associated with improved survival time and with decreased radiologic fibrosis per King's study published in AJRCCM vol 184 p1390.  Dec 2011  This may not be cause and effect, but given how universally unhelpful all the pf durgs have proven to be for PF,   rec trial max acid suppressio/ diet/ lifestyle modification then return in 6 weeks for recheck walking sats, pfts and consider escalation of care   See instructions for specific recommendations which were reviewed directly with the patient who was given a copy with highlighter outlining the key components.

## 2015-02-26 NOTE — Progress Notes (Signed)
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 Pulmonary Consult    Referred by Dr. Unice Cobble. Pt states having SOB since Dec 2015 after developing URI. He gets SOB with doing yard work and with singing. He states that he also has some PND and occ cough with white to clear sputum.   abruptly ill week before xmas, sense of acute sinus congestion, sore throat chest burning during severe coughing fits assoc with infected tooth rx with clindamycin x 40 days and toot better, all symptoms improved except now doe x flight of steps or yardwork or fast walking.  No obvious other patterns in day to day or daytime variabilty or assoc   cp or chest tightness, subjective wheeze overt sinus or hb symptoms. No unusual exp hx or h/o childhood pna/ asthma or knowledge of premature birth.  Sleeping ok without nocturnal  or early am exacerbation  of respiratory  c/o's or need for noct saba. Also denies any obvious fluctuation of symptoms with weather or environmental changes or other aggravating or alleviating factors except as outlined above   Current Medications, Allergies, Complete Past Medical History, Past Surgical History, Family History, and Social History were reviewed in [employer] record.             Review of Systems  Constitutional: Negative for fever, chills, activity change, appetite change and unexpected weight change.  HENT: Positive for postnasal drip. Negative for congestion, dental problem, rhinorrhea, sneezing, sore throat, trouble swallowing and voice change.   Eyes:  Negative for visual disturbance.  Respiratory: Positive for shortness of breath. Negative for cough and choking.   Cardiovascular: Negative for chest pain and leg swelling.  Gastrointestinal: Negative for nausea, vomiting and abdominal pain.  Genitourinary: Negative for difficulty urinating.  Musculoskeletal: Negative for arthralgias.  Skin: Negative for rash.  Psychiatric/Behavioral: Negative for behavioral problems and confusion.       Objective:   Physical Exam  amb wm nad  Wt Readings from Last 3 Encounters:  02/26/15 213 lb (96.616 kg)  02/12/15 213 lb 12 oz (96.956 kg)  04/11/14 216 lb 6.4 oz (98.158 kg)    Vital signs reviewed   HEENT: nl dentition, turbinates, and orophanx. Nl external ear canals without cough reflex   NECK :  without JVD/Nodes/TM/ nl carotid upstrokes bilaterally   LUNGS: no acc muscle use, bilateral insp crackles  Bases only s cough reflex    CV:  RRR  no s3 or murmur or increase in P2, no edema   ABD:  soft and nontender with nl excursion in the supine position. No bruits or organomegaly, bowel sounds nl  MS:  warm without deformities, calf tenderness, cyanosis or clubbing  SKIN: warm and dry without lesions    NEURO:  alert, approp, no deficits   Labs ordered today / images reviewed include:    ESR  Collagen vasc screen     I personally reviewed images and agree with radiology impression as follows:  CXR:   02/12/15 Worsening coarsened interstitial prominence  within the lungs, likely fibrosis.            Assessment & Plan:

## 2015-02-27 LAB — ANA: Anti Nuclear Antibody(ANA): NEGATIVE

## 2015-02-27 LAB — RHEUMATOID FACTOR: Rhuematoid fact SerPl-aCnc: 12 IU/mL (ref <=14)

## 2015-02-27 LAB — CYCLIC CITRUL PEPTIDE ANTIBODY, IGG

## 2015-02-27 LAB — SEDIMENTATION RATE: Sed Rate: 28 mm/hr — ABNORMAL HIGH (ref 0–22)

## 2015-02-27 NOTE — Progress Notes (Signed)
Quick Note:  Spoke with pt and notified of results per Dr. Wert. Pt verbalized understanding and denied any questions.  ______ 

## 2015-04-09 ENCOUNTER — Ambulatory Visit: Payer: Medicare Other | Admitting: Internal Medicine

## 2015-04-16 ENCOUNTER — Other Ambulatory Visit: Payer: Self-pay | Admitting: Internal Medicine

## 2015-05-02 ENCOUNTER — Ambulatory Visit: Payer: Medicare Other | Admitting: Internal Medicine

## 2015-05-16 ENCOUNTER — Encounter: Payer: Self-pay | Admitting: Internal Medicine

## 2015-05-16 ENCOUNTER — Ambulatory Visit (INDEPENDENT_AMBULATORY_CARE_PROVIDER_SITE_OTHER): Payer: Medicare Other | Admitting: Internal Medicine

## 2015-05-16 VITALS — BP 130/80 | HR 94 | Ht 71.5 in | Wt 209.0 lb

## 2015-05-16 DIAGNOSIS — J8410 Pulmonary fibrosis, unspecified: Secondary | ICD-10-CM | POA: Diagnosis not present

## 2015-05-16 DIAGNOSIS — K148 Other diseases of tongue: Secondary | ICD-10-CM

## 2015-05-16 NOTE — Patient Instructions (Addendum)
If cough gets worse next step is to change the cozar to diovan 160 mg one daily - call if you want [place] to get you started   Please see patient coordinator before you leave today  to schedule oral surgery evaluation of your tongue   Please schedule a follow up office visit in 6 weeks, call sooner if needed pfts on return

## 2015-05-16 NOTE — Progress Notes (Signed)
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 Pulmonary Consult    Referred by Dr. Unice Cobble. Pt states having SOB since Dec 2015 after developing URI. He gets SOB with doing yard work and with singing. He states that he also has some PND and occ cough with white to clear sputum.   abruptly ill week before xmas, sense of acute sinus congestion, sore throat chest burning during severe coughing fits assoc with infected tooth rx with clindamycin x 40 days and tooth better, all symptoms improved except now doe x flight of steps or yardwork or fast walking. rec Change prilosec to omeprazole 40 mg Take 30-60 min before first meal of the day and Pepcid ac 20 at bedtime until return GERD diet     05/16/2015 f/u ov/Nathan Howard re: pf/ cough  - unrelated new tongue burning x one month Chief Complaint  Patient presents with  . Follow-up    Breathing has improved some, but not quite back to baseline. Cough has also improved, still has feeling of mucus in his throat.      Not limited by breathing from desired activities  / starting to sing again   No obvious day to day or daytime variabilty or assoc chronic cough or cp or chest tightness, subjective wheeze overt sinus or hb symptoms. No unusual exp hx or h/o childhood pna/ asthma or knowledge of premature birth.  Sleeping ok without nocturnal  or early am exacerbation  of respiratory  c/o's or need for noct saba. Also denies any obvious fluctuation of symptoms with weather or environmental changes or other aggravating or  alleviating factors except as outlined above   Current Medications, Allergies, Complete Past Medical History, Past Surgical History, Family History, and Social History were reviewed in [employer] record.  ROS  The following are not active complaints unless bolded sore throat, dysphagia, dental problems, itching, sneezing,  nasal congestion or excess/ purulent secretions, ear ache,   fever, chills, sweats, unintended wt loss, pleuritic or exertional cp, hemoptysis,  orthopnea pnd or leg swelling, presyncope, palpitations, heartburn, abdominal pain, anorexia, nausea, vomiting, diarrhea  or change in bowel or urinary habits, change in stools or urine, dysuria,hematuria,  rash, arthralgias, visual complaints, headache, numbness weakness or ataxia or problems with walking or coordination,  change in mood/affect or memory.            Objective:   Physical Exam  amb wm nad  05/16/2015        209  Wt Readings from Last 3 Encounters:  02/26/15 213 lb (96.616 kg)  02/12/15 213 lb 12 oz (96.956 kg)  04/11/14 216 lb 6.4 oz (98.158 kg)    Vital signs reviewed   HEENT: nl dentition, turbinates, and orophanx. Nl external ear canals without cough reflex Tongue with tenderness along prox lateral base on L    NECK :  without JVD/Nodes/TM/ nl carotid upstrokes bilaterally   LUNGS: no acc muscle use, bilateral insp crackles  Bases with  Cough  on deep insp   CV:  RRR  no s3 or murmur or increase in P2, no edema   ABD:  soft and nontender with nl excursion in the supine position. No bruits or organomegaly, bowel sounds nl  MS:  warm without deformities, calf tenderness, cyanosis or clubbing  SKIN: warm and dry without lesions    NEURO:  alert, approp, no deficits        I personally reviewed images and agree with radiology impression as follows:  CXR:   02/12/15 Worsening coarsened interstitial prominence within the lungs, likely fibrosis.              Assessment & Plan:

## 2015-05-17 ENCOUNTER — Encounter: Payer: Self-pay | Admitting: Internal Medicine

## 2015-05-17 NOTE — Assessment & Plan Note (Signed)
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 -Smoking history encompassed less than 5 years. He's had no significant occupational exposures - 02/26/2015  Walked RA x 3 laps @ 185 ft each stopped due to  End of study , nl pace, no desats - 02/26/2015 collagen vasc screen sent > neg with esr 28  - 02/26/2015 rec max gerd rx/ diet and > improved 05/16/15   I had an extended discussion with the patient reviewing all relevant studies completed to date and  lasting 15 to 20 minutes of a 25 minute visit on the following ongoing concerns:    1) he clearly has improved on gerd rx  2) Use of PPI is associated with improved survival time and with decreased radiologic fibrosis per King's study published in AJRCCM vol 184 p1390.  Dec 2011  This may not be cause and effect, but given how universally unimpressive and expensive  all the other  Drugs developed to day  have been for pf,   rec consintu   rx ppi / diet/ lifestyle modification and f/u with serial walking sats and lung volumes for now to put more points on the curve / establish firm baseline before considering additional measures > pfts in 6 weeks

## 2015-05-17 NOTE — Assessment & Plan Note (Signed)
I could not viz the area of tenderness but it is new >>>Refered to oral surgery

## 2015-05-24 ENCOUNTER — Other Ambulatory Visit: Payer: Self-pay | Admitting: Internal Medicine

## 2015-06-28 ENCOUNTER — Encounter: Payer: Self-pay | Admitting: Internal Medicine

## 2015-06-28 ENCOUNTER — Telehealth: Payer: Self-pay | Admitting: Internal Medicine

## 2015-06-28 ENCOUNTER — Ambulatory Visit (INDEPENDENT_AMBULATORY_CARE_PROVIDER_SITE_OTHER): Payer: Medicare Other | Admitting: Internal Medicine

## 2015-06-28 VITALS — BP 140/84 | HR 74 | Ht 70.5 in | Wt 213.0 lb

## 2015-06-28 DIAGNOSIS — J8410 Pulmonary fibrosis, unspecified: Secondary | ICD-10-CM | POA: Diagnosis not present

## 2015-06-28 LAB — PULMONARY FUNCTION TEST
DL/VA % pred: 81 %
DL/VA: 3.78 ml/min/mmHg/L
DLCO UNC: 18.75 ml/min/mmHg
DLCO unc % pred: 56 %
FEF 25-75 Post: 4.62 L/sec
FEF 25-75 Pre: 3.93 L/sec
FEF2575-%Change-Post: 17 %
FEF2575-%PRED-PRE: 178 %
FEF2575-%Pred-Post: 210 %
FEV1-%CHANGE-POST: 2 %
FEV1-%PRED-PRE: 101 %
FEV1-%Pred-Post: 103 %
FEV1-POST: 3.2 L
FEV1-Pre: 3.13 L
FEV1FVC-%CHANGE-POST: 1 %
FEV1FVC-%Pred-Pre: 118 %
FEV6-%CHANGE-POST: 1 %
FEV6-%PRED-PRE: 91 %
FEV6-%Pred-Post: 92 %
FEV6-PRE: 3.65 L
FEV6-Post: 3.71 L
FEV6FVC-%Change-Post: 0 %
FEV6FVC-%PRED-POST: 106 %
FEV6FVC-%Pred-Pre: 105 %
FVC-%Change-Post: 1 %
FVC-%Pred-Post: 87 %
FVC-%Pred-Pre: 86 %
FVC-PRE: 3.67 L
FVC-Post: 3.71 L
POST FEV6/FVC RATIO: 100 %
Post FEV1/FVC ratio: 86 %
Pre FEV1/FVC ratio: 85 %
Pre FEV6/FVC Ratio: 100 %
RV % pred: 31 %
RV: 0.82 L
TLC % PRED: 62 %
TLC: 4.47 L

## 2015-06-28 NOTE — Progress Notes (Signed)
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 Pulmonary Consult    Referred by Dr. Unice Cobble. Pt states having SOB since Dec 2015 after developing URI. He gets SOB with doing yard work and with singing. He states that he also has some PND and occ cough with white to clear sputum.   abruptly ill week before xmas, sense of acute sinus congestion, sore throat chest burning during severe coughing fits assoc with infected tooth rx with clindamycin x 40 days and tooth better, all symptoms improved except now doe x flight of steps or yardwork or fast walking. rec Change prilosec to omeprazole 40 mg Take 30-60 min before first meal of the day and Pepcid ac 20 at bedtime until return GERD diet     05/16/2015 f/u ov/Nathan Howard re: pf/ cough  - unrelated new tongue burning x one month Chief Complaint  Patient presents with  . Follow-up    Breathing has improved some, but not quite back to baseline. Cough has also improved, still has feeling of mucus in his throat.      Not limited by breathing from desired activities  / starting to sing again  rec If cough gets worse next step is to change the cozar to diovan 160 mg one daily - call if you want [place] to get you started  Please see patient coordinator before you leave today  to schedule oral surgery evaluation of your tongue > bx end of June >>>   06/28/2015 f/u ov/Nathan Howard re: PF/ no change  Chief Complaint  Patient presents with  . Follow-up    PFT done today.  Pt states his cough and SOB have improved. No new co's today.     No obvious day to day or daytime variabilty or assoc cp or chest tightness, subjective wheeze overt sinus or hb symptoms. No unusual exp hx or h/o childhood pna/ asthma or knowledge of premature birth.  Sleeping ok without nocturnal  or early am exacerbation  of respiratory  c/o's or need for noct saba. Also denies any obvious fluctuation of symptoms with weather or environmental changes or other aggravating or alleviating factors except as outlined above   Current Medications, Allergies, Complete Past Medical History, Past Surgical History, Family History, and Social History were reviewed in [employer] record.  ROS  The following are not active complaints unless bolded sore throat, dysphagia, dental problems, itching, sneezing,  nasal congestion or excess/ purulent secretions, ear ache,   fever, chills, sweats, unintended wt loss, pleuritic or exertional cp, hemoptysis,  orthopnea pnd or leg swelling, presyncope, palpitations, heartburn, abdominal pain, anorexia, nausea, vomiting, diarrhea  or change in bowel or urinary habits, change in stools or urine, dysuria,hematuria,  rash, arthralgias, visual complaints, headache, numbness weakness or ataxia or problems with walking or coordination,  change in mood/affect or memory.            Objective:   Physical Exam  amb wm nad  05/16/2015  209 > 06/28/2015    213  Wt Readings from Last 3 Encounters:  02/26/15 213 lb (96.616 kg)  02/12/15 213 lb 12 oz (96.956 kg)  04/11/14 216 lb 6.4 oz (98.158 kg)    Vital signs reviewed   HEENT: nl dentition, turbinates, and orophanx. Nl external ear canals without cough reflex    NECK :  without JVD/Nodes/TM/ nl carotid upstrokes bilaterally   LUNGS: no acc muscle use,  bilateral insp crackles  - no longer cough on deep breath    CV:  RRR  no s3 or murmur or increase in P2, no edema   ABD:  soft and nontender with nl excursion in the supine position. No bruits or  organomegaly, bowel sounds nl  MS:  warm without deformities, calf tenderness, cyanosis or clubbing  SKIN: warm and dry without lesions    NEURO:  alert, approp, no deficits        I personally reviewed images and agree with radiology impression as follows:  CXR:   02/12/15 Worsening coarsened interstitial prominence within the lungs, likely fibrosis.            Assessment & Plan:

## 2015-06-28 NOTE — Telephone Encounter (Signed)
Pt came by office and stated he knows he has to make an appt before RX's can be filled but he is awaiting results from biopsies and would like to wait and make an appt with [facility] after getting those results.  He is requesting his refills be filled until that appt is made.

## 2015-06-28 NOTE — Patient Instructions (Signed)
No change in recommendations  Try to walk regularly and let me know if you are losing any ground  Please schedule a follow up visit in 6  months but call sooner if needed cxr on return

## 2015-06-28 NOTE — Telephone Encounter (Signed)
Pt will call back when he receives the results back from biopsies to schedule an appt with hopp.

## 2015-06-28 NOTE — Progress Notes (Signed)
PFT done today. 

## 2015-06-30 ENCOUNTER — Encounter: Payer: Self-pay | Admitting: Internal Medicine

## 2015-06-30 NOTE — Assessment & Plan Note (Addendum)
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 -Smoking history encompassed less than 5 years. He's had no significant occupational exposures - 02/26/2015  Walked RA x 3 laps @ 185 ft each stopped due to  End of study , nl pace, no desats - 02/26/2015 collagen vasc screen sent > neg with esr 28  - 02/26/2015 rec max gerd rx/ diet and > improved 05/16/15  - 06/28/2015  VC 3.65 (85%) no obst and dlco 56 corrects  to 81  - 06/28/2015  Walked RA x 3 laps @ 185 ft each stopped due to End of study, nl pace, no sob or desat   So he has very mild PF with control of all his symptoms just by treating gerd Use of PPI is associated with improved survival time and with decreased radiologic fibrosis per King's study published in AJRCCM vol 184 p1390.  Dec 2011  This may not be cause and effect, but given how universally unimpressive and expensive  all the other  Drugs developed to day  have been for pf,   rec continue  rx ppi / diet/ lifestyle modification and f/u with serial walking sats and lung volumes for now to put more points on the curve / establish firm baseline before considering additional measures.   I had an extended discussion with the patient reviewing all relevant studies completed to date and  lasting 15 to 20 minutes of a 25 minute visit    Each maintenance medication was reviewed in detail including most importantly the difference between maintenance and prns and under what circumstances the prns are to be triggered using an action plan format that is not reflected in the computer generated alphabetically organized AVS.    Please see instructions for details which were reviewed in writing and the patient given a copy highlighting the part that I personally wrote and discussed at today's ov.

## 2015-07-19 ENCOUNTER — Other Ambulatory Visit: Payer: Self-pay | Admitting: Internal Medicine

## 2015-09-05 ENCOUNTER — Ambulatory Visit (INDEPENDENT_AMBULATORY_CARE_PROVIDER_SITE_OTHER): Payer: Medicare Other | Admitting: Internal Medicine

## 2015-09-05 ENCOUNTER — Encounter: Payer: Self-pay | Admitting: Internal Medicine

## 2015-09-05 VITALS — BP 130/74 | HR 69 | Temp 98.4°F | Resp 16 | Wt 207.0 lb

## 2015-09-05 DIAGNOSIS — L308 Other specified dermatitis: Secondary | ICD-10-CM

## 2015-09-05 DIAGNOSIS — L309 Dermatitis, unspecified: Secondary | ICD-10-CM | POA: Diagnosis not present

## 2015-09-05 MED ORDER — MOMETASONE FUROATE 0.1 % EX OINT: TOPICAL_OINTMENT | Freq: Two times a day (BID) | CUTANEOUS | Status: DC

## 2015-09-05 MED ORDER — PREDNISONE 20 MG PO TABS
20.0000 mg | ORAL_TABLET | Freq: Two times a day (BID) | ORAL | Status: DC
Start: 2015-09-05 — End: 2015-12-27

## 2015-09-05 MED ORDER — HYDROXYZINE HCL 10 MG PO TABS: 10.0000 mg | ORAL_TABLET | Freq: Three times a day (TID) | ORAL | Status: DC | PRN

## 2015-09-05 NOTE — Patient Instructions (Signed)
Avoid soaps and cosmetics which are not hypoallergenic. Restrict hyperallergenic foods at this time: Nuts, strawberries, seafood , chocolate, and tomatoes. 

## 2015-09-05 NOTE — Progress Notes (Signed)
   Subjective:    Patient ID: Nathan Howard, male    DOB: 10/06/1941, 74 y.o.   MRN: [id number]  HPI  He had known exposure to poison oak in his yard cleaning up sticks and a woodpile 08/31/15. Over the weekend he developed diffuse vesicles over the lower extremities which were markedly pruritic. These have progressed to papules. He is had some lesions on the upper back as well as around the waist. The itching is intense and associated with burning that disturbs sleep. Witch hazel, [place], and histamines have only been of minimal benefit.  He's not been checking his sugars recently.  Review of Systems  No associated itchy, watery eyes.  Swelling of the lips or tongue denied.  Shortness of breath, wheezing, or cough absent.  No urticaria noted.  Fever ,chills , or sweats denied. Purulence absent.  Diarrhea not present.      Objective:   Physical Exam He has diffuse scattered vesicles over the lower extremities. There are also isolated papules. The main lesions are of the lower extremities although he has some at the waist. He has some elevated papules over the upper back without associated vesicle formation. He has juicy rales at the left lower lobe greater than the right. Large ventral hernias noted. Pes planus is present.  General appearance:Adequately nourished; no acute distress or increased work of breathing is present.    Lymphatic: No  lymphadenopathy about the head, neck, or axilla .  Eyes: No conjunctival inflammation or lid edema is present. There is no scleral icterus.  Ears:  External ear exam shows no significant lesions or deformities.    Nose:  External nasal examination shows no deformity or inflammation. Nasal mucosa are pink and moist without lesions or exudates No septal dislocation or deviation.No obstruction to airflow.   Oral exam: Dental hygiene is good; lips and gums are healthy appearing.There is no oropharyngeal erythema or exudate .  Neck:  No  deformities, thyromegaly, masses, or tenderness noted.   Supple with full range of motion without pain.   Heart:  Normal rate and regular rhythm. S1 and S2 normal without gallop, murmur, click, rub or other extra sounds.   Extremities:  No cyanosis, edema, or clubbing  noted    Skin: Warm & dry w/o tenting or jaundice.        Assessment & Plan:  #1 Rhus dermatitis by history but pattern suggests other vesicular dermatitis as lesions are discrete w/o maceration and location is atypical  See orders. Derm referral for biopsy if symptoms persist. He is to monitor glucoses on oral steroids.

## 2015-09-05 NOTE — Progress Notes (Signed)
Pre visit review using our clinic review tool, if applicable. No additional management support is needed unless otherwise documented below in the visit note. 

## 2015-10-24 LAB — HM DIABETES EYE EXAM

## 2015-11-01 ENCOUNTER — Encounter: Payer: Self-pay | Admitting: Internal Medicine

## 2015-11-07 ENCOUNTER — Other Ambulatory Visit (INDEPENDENT_AMBULATORY_CARE_PROVIDER_SITE_OTHER): Payer: Medicare Other

## 2015-11-07 ENCOUNTER — Ambulatory Visit (INDEPENDENT_AMBULATORY_CARE_PROVIDER_SITE_OTHER): Payer: Medicare Other | Admitting: Internal Medicine

## 2015-11-07 ENCOUNTER — Encounter: Payer: Self-pay | Admitting: Internal Medicine

## 2015-11-07 ENCOUNTER — Ambulatory Visit: Payer: Medicare Other | Admitting: Internal Medicine

## 2015-11-07 VITALS — BP 130/80 | HR 72 | Temp 98.2°F | Ht 70.5 in | Wt 209.5 lb

## 2015-11-07 DIAGNOSIS — K148 Other diseases of tongue: Secondary | ICD-10-CM

## 2015-11-07 DIAGNOSIS — IMO0002 Reserved for concepts with insufficient information to code with codable children: Secondary | ICD-10-CM

## 2015-11-07 DIAGNOSIS — E785 Hyperlipidemia, unspecified: Secondary | ICD-10-CM

## 2015-11-07 DIAGNOSIS — E1129 Type 2 diabetes mellitus with other diabetic kidney complication: Secondary | ICD-10-CM | POA: Diagnosis not present

## 2015-11-07 DIAGNOSIS — I10 Essential (primary) hypertension: Secondary | ICD-10-CM

## 2015-11-07 DIAGNOSIS — D518 Other vitamin B12 deficiency anemias: Secondary | ICD-10-CM | POA: Diagnosis not present

## 2015-11-07 DIAGNOSIS — E1165 Type 2 diabetes mellitus with hyperglycemia: Secondary | ICD-10-CM

## 2015-11-07 LAB — CBC WITH DIFFERENTIAL/PLATELET
BASOS PCT: 0.4 % (ref 0.0–3.0)
Basophils Absolute: 0 10*3/uL (ref 0.0–0.1)
EOS ABS: 0.3 10*3/uL (ref 0.0–0.7)
Eosinophils Relative: 4.7 % (ref 0.0–5.0)
HEMATOCRIT: 39.1 % (ref 39.0–52.0)
Hemoglobin: 12.9 g/dL — ABNORMAL LOW (ref 13.0–17.0)
LYMPHS ABS: 2.1 10*3/uL (ref 0.7–4.0)
LYMPHS PCT: 35.2 % (ref 12.0–46.0)
MCHC: 33.1 g/dL (ref 30.0–36.0)
MCV: 89.4 fl (ref 78.0–100.0)
MONOS PCT: 7.1 % (ref 3.0–12.0)
Monocytes Absolute: 0.4 10*3/uL (ref 0.1–1.0)
NEUTROS ABS: 3.1 10*3/uL (ref 1.4–7.7)
NEUTROS PCT: 52.6 % (ref 43.0–77.0)
PLATELETS: 266 10*3/uL (ref 150.0–400.0)
RBC: 4.38 Mil/uL (ref 4.22–5.81)
RDW: 13.9 % (ref 11.5–15.5)
WBC: 5.9 10*3/uL (ref 4.0–10.5)

## 2015-11-07 LAB — HEMOGLOBIN A1C: Hgb A1c MFr Bld: 7 % — ABNORMAL HIGH (ref 4.6–6.5)

## 2015-11-07 NOTE — Progress Notes (Signed)
Pre visit review using our clinic review tool, if applicable. No additional management support is needed unless otherwise documented below in the visit note. 

## 2015-11-07 NOTE — Patient Instructions (Signed)
  Your next office appointment will be determined based upon review of your pending labs  and  xrays  Those written interpretation of the lab results and instructions will be transmitted to you by mail for your records.  Critical results will be called.   Followup as needed for any active or acute issue. Please report any significant change in your symptoms. 

## 2015-11-07 NOTE — Progress Notes (Signed)
   Subjective:    Patient ID: Nathan Howard, male    DOB: [birth year], 74 y.o.   MRN: [id number]  HPI He has been compliant with his medications without adverse effects. He is on no specific diet and eats red meats, fried foods, and some salt. He describes it as "light salt". It sounds as if he eats two thirds of his meals out of the home. He has no regular exercise; he does work in the yard occasionally. He is not monitoring his glucoses or blood pressures.  Ophthalmologic exam last week was negative. He's aged out of colonoscopies. He has no GI symptoms.  Review of systems include some difficulty starting stream occasionally and some dribbling. He has occasional dysuria. He has nocturia 2-3 times per night.  Dr.Wert is following his pulmonary fibrosis. He has a follow-up in January. ANA and sedimentation rate were essentially normal. He does have exertional dyspnea especially with inclines. Also heat or humidity affect his exertional dyspnea.  Review of Systems Chest pain, palpitations, tachycardia,  paroxysmal nocturnal dyspnea, claudication or edema are absent. No unexplained weight loss, abdominal pain, significant dyspepsia, dysphagia, melena, rectal bleeding, or persistently small caliber stools. Pyuria, hematuria, frequency, or polyuria are denied. Change in hair, skin, nails denied. No bowel changes of constipation or diarrhea. No intolerance to heat or cold.     Objective:   Physical Exam Pertinent or positive findings include: He has bilateral ptosis. Dry rales are noted diffusely. Xiphoid is prominent. Pes planus is noted. He has decreased sensation over the soles of the feet diffusely.  General appearance :adequately nourished; in no distress.  Eyes: No conjunctival inflammation or scleral icterus is present.  Oral exam:  Lips and gums are healthy appearing.There is no oropharyngeal erythema or exudate noted. Dental hygiene is good.  Heart:  Normal rate and regular  rhythm. S1 and S2 normal without gallop, murmur, click, rub or other extra sounds    Lungs:No increased work of breathing.   Abdomen: bowel sounds normal, soft and non-tender without masses, organomegaly or hernias noted.  No guarding or rebound.   Vascular : all pulses equal ; no bruits present.  Skin:Warm & dry.  Intact without suspicious lesions or rashes ; no tenting or jaundice   Lymphatic: No lymphadenopathy is noted about the head, neck, axilla, or inguinal areas.   Neuro: Strength, tone & DTRs normal.     Assessment & Plan:  See Current Assessment & Plan in Problem List under specific Diagnosis

## 2015-11-08 ENCOUNTER — Ambulatory Visit (INDEPENDENT_AMBULATORY_CARE_PROVIDER_SITE_OTHER): Payer: Medicare Other

## 2015-11-08 DIAGNOSIS — Z23 Encounter for immunization: Secondary | ICD-10-CM

## 2015-11-08 LAB — LIPID PANEL
Cholesterol: 170 mg/dL (ref 0–200)
HDL: 32.4 mg/dL — AB (ref >39.00)
NonHDL: 137.51
TRIGLYCERIDES: 221 mg/dL — AB (ref 0.0–149.0)
Total CHOL/HDL Ratio: 5
VLDL: 44.2 mg/dL — AB (ref 0.0–40.0)

## 2015-11-08 LAB — BASIC METABOLIC PANEL
BUN: 24 mg/dL — ABNORMAL HIGH (ref 6–23)
CHLORIDE: 104 meq/L (ref 96–112)
CO2: 23 meq/L (ref 19–32)
CREATININE: 1.61 mg/dL — AB (ref 0.40–1.50)
Calcium: 10 mg/dL (ref 8.4–10.5)
GFR: 44.48 mL/min — ABNORMAL LOW (ref >60.00)
Glucose, Bld: 114 mg/dL — ABNORMAL HIGH (ref 70–99)
Potassium: 4.3 mEq/L (ref 3.5–5.1)
SODIUM: 139 meq/L (ref 135–145)

## 2015-11-08 LAB — HEPATIC FUNCTION PANEL
ALBUMIN: 4.4 g/dL (ref 3.5–5.2)
ALT: 18 U/L (ref 0–53)
AST: 27 U/L (ref 0–37)
Alkaline Phosphatase: 33 U/L — ABNORMAL LOW (ref 39–117)
Bilirubin, Direct: 0.1 mg/dL (ref 0.0–0.3)
TOTAL PROTEIN: 7.6 g/dL (ref 6.0–8.3)
Total Bilirubin: 0.5 mg/dL (ref 0.2–1.2)

## 2015-11-08 LAB — LDL CHOLESTEROL, DIRECT: LDL DIRECT: 116 mg/dL

## 2015-11-08 LAB — MICROALBUMIN / CREATININE URINE RATIO
CREATININE, U: 44.5 mg/dL
MICROALB/CREAT RATIO: 1.6 mg/g (ref 0.0–30.0)
Microalb, Ur: <0.7 mg/dL (ref 0.0–1.9)

## 2015-11-08 LAB — TSH: TSH: 5.79 u[IU]/mL — ABNORMAL HIGH (ref 0.35–4.50)

## 2015-11-08 LAB — VITAMIN B12: VITAMIN B 12: 355 pg/mL (ref 211–911)

## 2015-11-08 NOTE — Assessment & Plan Note (Signed)
CBC & B12 level 

## 2015-11-08 NOTE — Assessment & Plan Note (Signed)
A1c , urine microalbumin, BMET 

## 2015-11-08 NOTE — Assessment & Plan Note (Signed)
Lipids, LFTs, TSH  

## 2015-11-11 ENCOUNTER — Other Ambulatory Visit: Payer: Self-pay | Admitting: Internal Medicine

## 2015-11-11 DIAGNOSIS — E1129 Type 2 diabetes mellitus with other diabetic kidney complication: Secondary | ICD-10-CM

## 2015-11-11 DIAGNOSIS — R7989 Other specified abnormal findings of blood chemistry: Secondary | ICD-10-CM | POA: Insufficient documentation

## 2015-11-11 DIAGNOSIS — IMO0002 Reserved for concepts with insufficient information to code with codable children: Secondary | ICD-10-CM

## 2015-11-11 DIAGNOSIS — E1165 Type 2 diabetes mellitus with hyperglycemia: Principal | ICD-10-CM

## 2015-11-11 DIAGNOSIS — D518 Other vitamin B12 deficiency anemias: Secondary | ICD-10-CM

## 2015-11-11 MED ORDER — LEVOTHYROXINE SODIUM 25 MCG PO TABS: 25.0000 ug | ORAL_TABLET | Freq: Every day | ORAL | Status: DC

## 2015-11-12 ENCOUNTER — Other Ambulatory Visit: Payer: Self-pay | Admitting: Emergency Medicine

## 2015-11-12 DIAGNOSIS — R7989 Other specified abnormal findings of blood chemistry: Secondary | ICD-10-CM

## 2015-11-12 MED ORDER — OMEPRAZOLE 20 MG PO CPDR: 20.0000 mg | DELAYED_RELEASE_CAPSULE | Freq: Every day | ORAL | Status: DC

## 2015-11-12 MED ORDER — LEVOTHYROXINE SODIUM 25 MCG PO TABS: 25.0000 ug | ORAL_TABLET | Freq: Every day | ORAL | Status: DC

## 2015-11-12 MED ORDER — LOSARTAN POTASSIUM 100 MG PO TABS: 100.0000 mg | ORAL_TABLET | Freq: Every day | ORAL | Status: DC

## 2015-11-12 MED ORDER — GLIMEPIRIDE 2 MG PO TABS: ORAL_TABLET | ORAL | Status: DC

## 2015-11-14 ENCOUNTER — Other Ambulatory Visit: Payer: Self-pay | Admitting: Internal Medicine

## 2015-11-24 ENCOUNTER — Other Ambulatory Visit: Payer: Self-pay | Admitting: Internal Medicine

## 2015-11-29 ENCOUNTER — Encounter: Payer: Self-pay | Admitting: Internal Medicine

## 2015-11-29 DIAGNOSIS — C4491 Basal cell carcinoma of skin, unspecified: Secondary | ICD-10-CM | POA: Insufficient documentation

## 2015-12-25 ENCOUNTER — Ambulatory Visit: Payer: Medicare Other | Admitting: Internal Medicine

## 2015-12-27 ENCOUNTER — Ambulatory Visit (INDEPENDENT_AMBULATORY_CARE_PROVIDER_SITE_OTHER): Payer: Medicare Other | Admitting: Internal Medicine

## 2015-12-27 ENCOUNTER — Ambulatory Visit (INDEPENDENT_AMBULATORY_CARE_PROVIDER_SITE_OTHER)
Admission: RE | Admit: 2015-12-27 | Discharge: 2015-12-27 | Disposition: A | Discharge status: Home / Self Care | Payer: Medicare Other | Source: Ambulatory Visit | Attending: Internal Medicine | Admitting: Internal Medicine

## 2015-12-27 ENCOUNTER — Telehealth: Payer: Self-pay | Admitting: Emergency Medicine

## 2015-12-27 ENCOUNTER — Encounter: Payer: Self-pay | Admitting: Internal Medicine

## 2015-12-27 DIAGNOSIS — J8410 Pulmonary fibrosis, unspecified: Secondary | ICD-10-CM | POA: Diagnosis not present

## 2015-12-27 NOTE — Telephone Encounter (Signed)
Spoke with pts wife. She will have pt call when he gets home.

## 2015-12-27 NOTE — Progress Notes (Signed)
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 Pulmonary Consult    Referred by Dr. Unice Cobble. Pt states having SOB since Dec 2015 after developing URI. He gets SOB with doing yard work and with singing. He states that he also has some PND and occ cough with white to clear sputum.   abruptly ill week before xmas, sense of acute sinus congestion, sore throat chest burning during severe coughing fits assoc with infected tooth rx with clindamycin x 40 days and tooth better, all symptoms improved except now doe x flight of steps or yardwork or fast walking. rec Change prilosec to omeprazole 40 mg Take 30-60 min before first meal of the day and Pepcid ac 20 at bedtime until return GERD diet     05/16/2015 f/u ov/Amyria Komar re: pf/ cough  - unrelated new tongue burning x one month Chief Complaint  Patient presents with  . Follow-up    Breathing has improved some, but not quite back to baseline. Cough has also improved, still has feeling of mucus in his throat.      Not limited by breathing from desired activities  / starting to sing again  rec If cough gets worse next step is to change the cozar to diovan 160 mg one daily - call if you want [place] to get you started  Please see patient coordinator before you leave today  to schedule oral surgery evaluation of your tongue > bx end of June >>>   06/28/2015 f/u ov/Lagretta Loseke re: PF/ no change  Chief Complaint  Patient presents with  . Follow-up    PFT done today.  Pt states his cough and SOB have improved. No new co's today.    rec  No change in recommendations Try to walk regularly and let me know if you are losing any ground Please schedule a follow up visit in 6  months but call sooner if needed cxr on return     12/27/2015  f/u ov/Abdikadir Fohl re: PF/ no change ex tol Chief Complaint  Patient presents with  . Follow-up    CXR done today. Breathing is unchanged. No new co's today.      No obvious day to day or daytime variabilty or assoc cough or cp or chest tightness, subjective wheeze overt sinus or hb symptoms. No unusual exp hx or h/o childhood pna/ asthma or knowledge of premature birth.  Sleeping ok without nocturnal  or early am exacerbation  of respiratory  c/o's or need for noct saba. Also denies any obvious fluctuation of symptoms with weather or environmental changes or other aggravating or alleviating factors except as outlined above   Current Medications, Allergies, Complete Past Medical History, Past Surgical History, Family History, and Social History were reviewed in [employer] record.  ROS  The following are not active complaints unless bolded sore throat, dysphagia, dental problems, itching, sneezing,  nasal congestion or excess/ purulent secretions, ear ache,   fever, chills, sweats, unintended wt loss, pleuritic or exertional  cp, hemoptysis,  orthopnea pnd or leg swelling, presyncope, palpitations, heartburn, abdominal pain, anorexia, nausea, vomiting, diarrhea  or change in bowel or urinary habits, change in stools or urine, dysuria,hematuria,  rash, arthralgias, visual complaints, headache, numbness weakness or ataxia or problems with walking or coordination,  change in mood/affect or memory.            Objective:   Physical Exam  amb wm nad  05/16/2015        209 > 06/28/2015    213  > 12/27/2015   211 Wt Readings from Last 3 Encounters:  02/26/15 213 lb (96.616 kg)  02/12/15 213 lb 12 oz (96.956 kg)  04/11/14 216 lb 6.4 oz (98.158 kg)    Vital signs  reviewed   HEENT: nl dentition, turbinates, and orophanx. Nl external ear canals without cough reflex    NECK :  without JVD/Nodes/TM/ nl carotid upstrokes bilaterally   LUNGS: no acc muscle use,  bilateral insp crackles  - no longer cough on deep breath    CV:  RRR  no s3 or murmur or increase in P2, no edema   ABD:  soft and nontender with nl excursion in the supine position. No bruits or organomegaly, bowel sounds nl  MS:  warm without deformities, calf tenderness, cyanosis or clubbing  SKIN: warm and dry without lesions    NEURO:  alert, approp, no deficits       CXR PA and Lateral:   12/27/2015 :    I personally reviewed images and agree with radiology impression as follows:   1. Findings most consistent chronic interstitial lung disease. Active interstitial pneumonitis cannot be excluded.  2. Stable cardiomegaly            Assessment & Plan:

## 2015-12-27 NOTE — Patient Instructions (Addendum)
No change in recommendations  Try to walk regularly and let me know if you are losing any ground  Please schedule a follow up visit in 6  Months with full pfts

## 2015-12-31 NOTE — Assessment & Plan Note (Signed)
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 -Smoking history encompassed less than 5 years. He's had no significant occupational exposures - 02/26/2015  Walked RA x 3 laps @ 185 ft each stopped due to  End of study , nl pace, no desats - 02/26/2015 collagen vasc screen sent > neg with esr 28  - 02/26/2015 rec max gerd rx/ diet and > improved 05/16/15  - 06/28/2015  VC 3.65 (85%) no obst and dlco 56 corrects  to 81  - 06/28/2015  Walked RA x 3 laps @ 185 ft each stopped due to End of study, nl pace, no sob or desat   No evidence of progression > Discussed in detail all the  indications, usual  risks and alternatives  relative to the benefits with patient who agrees to proceed with conservative f/u as outlined    Each maintenance medication was reviewed in detail including most importantly the difference between maintenance and as needed and under what circumstances the prns are to be used.  Please see instructions for details which were reviewed in writing and the patient given a copy.

## 2016-02-11 ENCOUNTER — Other Ambulatory Visit (INDEPENDENT_AMBULATORY_CARE_PROVIDER_SITE_OTHER): Payer: Medicare Other

## 2016-02-11 DIAGNOSIS — IMO0002 Reserved for concepts with insufficient information to code with codable children: Secondary | ICD-10-CM

## 2016-02-11 DIAGNOSIS — E1129 Type 2 diabetes mellitus with other diabetic kidney complication: Secondary | ICD-10-CM | POA: Diagnosis not present

## 2016-02-11 DIAGNOSIS — R946 Abnormal results of thyroid function studies: Secondary | ICD-10-CM

## 2016-02-11 DIAGNOSIS — E1165 Type 2 diabetes mellitus with hyperglycemia: Secondary | ICD-10-CM | POA: Diagnosis not present

## 2016-02-11 DIAGNOSIS — D518 Other vitamin B12 deficiency anemias: Secondary | ICD-10-CM

## 2016-02-11 DIAGNOSIS — R7989 Other specified abnormal findings of blood chemistry: Secondary | ICD-10-CM

## 2016-02-11 LAB — BASIC METABOLIC PANEL
BUN: 20 mg/dL (ref 6–23)
CALCIUM: 9.9 mg/dL (ref 8.4–10.5)
CO2: 26 mEq/L (ref 19–32)
Chloride: 103 mEq/L (ref 96–112)
Creatinine, Ser: 1.41 mg/dL (ref 0.40–1.50)
GFR: 51.8 mL/min — ABNORMAL LOW (ref >60.00)
GLUCOSE: 146 mg/dL — AB (ref 70–99)
POTASSIUM: 4.2 meq/L (ref 3.5–5.1)
SODIUM: 138 meq/L (ref 135–145)

## 2016-02-11 LAB — CBC WITH DIFFERENTIAL/PLATELET
BASOS ABS: 0 10*3/uL (ref 0.0–0.1)
Basophils Relative: 0.4 % (ref 0.0–3.0)
EOS PCT: 4 % (ref 0.0–5.0)
Eosinophils Absolute: 0.3 10*3/uL (ref 0.0–0.7)
HCT: 40.8 % (ref 39.0–52.0)
Hemoglobin: 13.8 g/dL (ref 13.0–17.0)
LYMPHS ABS: 2.2 10*3/uL (ref 0.7–4.0)
Lymphocytes Relative: 35.2 % (ref 12.0–46.0)
MCHC: 33.7 g/dL (ref 30.0–36.0)
MCV: 87.5 fl (ref 78.0–100.0)
MONOS PCT: 6 % (ref 3.0–12.0)
Monocytes Absolute: 0.4 10*3/uL (ref 0.1–1.0)
NEUTROS ABS: 3.4 10*3/uL (ref 1.4–7.7)
NEUTROS PCT: 54.4 % (ref 43.0–77.0)
PLATELETS: 250 10*3/uL (ref 150.0–400.0)
RBC: 4.67 Mil/uL (ref 4.22–5.81)
RDW: 13.8 % (ref 11.5–15.5)
WBC: 6.3 10*3/uL (ref 4.0–10.5)

## 2016-02-11 LAB — TSH: TSH: 4.37 u[IU]/mL (ref 0.35–4.50)

## 2016-02-11 LAB — HEMOGLOBIN A1C: Hgb A1c MFr Bld: 7.9 % — ABNORMAL HIGH (ref 4.6–6.5)

## 2016-02-12 ENCOUNTER — Encounter: Payer: Self-pay | Admitting: Emergency Medicine

## 2016-02-18 ENCOUNTER — Ambulatory Visit: Payer: Medicare Other | Admitting: Internal Medicine

## 2016-03-03 ENCOUNTER — Encounter: Payer: Self-pay | Admitting: Gastroenterology

## 2016-03-31 ENCOUNTER — Encounter: Payer: Self-pay | Admitting: Internal Medicine

## 2016-03-31 ENCOUNTER — Other Ambulatory Visit (INDEPENDENT_AMBULATORY_CARE_PROVIDER_SITE_OTHER): Payer: Medicare Other

## 2016-03-31 ENCOUNTER — Ambulatory Visit (INDEPENDENT_AMBULATORY_CARE_PROVIDER_SITE_OTHER): Payer: Medicare Other | Admitting: Internal Medicine

## 2016-03-31 VITALS — BP 130/80 | HR 66 | Temp 98.3°F | Resp 16 | Ht 70.5 in | Wt 210.0 lb

## 2016-03-31 DIAGNOSIS — K219 Gastro-esophageal reflux disease without esophagitis: Secondary | ICD-10-CM

## 2016-03-31 DIAGNOSIS — E1129 Type 2 diabetes mellitus with other diabetic kidney complication: Secondary | ICD-10-CM | POA: Diagnosis not present

## 2016-03-31 DIAGNOSIS — E785 Hyperlipidemia, unspecified: Secondary | ICD-10-CM

## 2016-03-31 DIAGNOSIS — N289 Disorder of kidney and ureter, unspecified: Secondary | ICD-10-CM

## 2016-03-31 DIAGNOSIS — IMO0002 Reserved for concepts with insufficient information to code with codable children: Secondary | ICD-10-CM

## 2016-03-31 DIAGNOSIS — R7989 Other specified abnormal findings of blood chemistry: Secondary | ICD-10-CM

## 2016-03-31 DIAGNOSIS — R946 Abnormal results of thyroid function studies: Secondary | ICD-10-CM

## 2016-03-31 DIAGNOSIS — D518 Other vitamin B12 deficiency anemias: Secondary | ICD-10-CM

## 2016-03-31 DIAGNOSIS — I10 Essential (primary) hypertension: Secondary | ICD-10-CM | POA: Diagnosis not present

## 2016-03-31 DIAGNOSIS — E1165 Type 2 diabetes mellitus with hyperglycemia: Secondary | ICD-10-CM

## 2016-03-31 LAB — CBC WITH DIFFERENTIAL/PLATELET
BASOS ABS: 0 10*3/uL (ref 0.0–0.1)
BASOS PCT: 0.6 % (ref 0.0–3.0)
Eosinophils Absolute: 0.3 10*3/uL (ref 0.0–0.7)
Eosinophils Relative: 4.1 % (ref 0.0–5.0)
HEMATOCRIT: 38.9 % — AB (ref 39.0–52.0)
Hemoglobin: 13.4 g/dL (ref 13.0–17.0)
LYMPHS ABS: 2.1 10*3/uL (ref 0.7–4.0)
LYMPHS PCT: 34.7 % (ref 12.0–46.0)
MCHC: 34.4 g/dL (ref 30.0–36.0)
MCV: 86.8 fl (ref 78.0–100.0)
MONOS PCT: 6.6 % (ref 3.0–12.0)
Monocytes Absolute: 0.4 10*3/uL (ref 0.1–1.0)
NEUTROS ABS: 3.3 10*3/uL (ref 1.4–7.7)
NEUTROS PCT: 54 % (ref 43.0–77.0)
PLATELETS: 257 10*3/uL (ref 150.0–400.0)
RBC: 4.48 Mil/uL (ref 4.22–5.81)
RDW: 13.9 % (ref 11.5–15.5)
WBC: 6.1 10*3/uL (ref 4.0–10.5)

## 2016-03-31 LAB — COMPREHENSIVE METABOLIC PANEL
ALT: 17 U/L (ref 0–53)
AST: 20 U/L (ref 0–37)
Albumin: 4.3 g/dL (ref 3.5–5.2)
Alkaline Phosphatase: 34 U/L — ABNORMAL LOW (ref 39–117)
BILIRUBIN TOTAL: 0.5 mg/dL (ref 0.2–1.2)
BUN: 23 mg/dL (ref 6–23)
CALCIUM: 10 mg/dL (ref 8.4–10.5)
CHLORIDE: 103 meq/L (ref 96–112)
CO2: 27 meq/L (ref 19–32)
Creatinine, Ser: 1.52 mg/dL — ABNORMAL HIGH (ref 0.40–1.50)
GFR: 47.48 mL/min — AB (ref >60.00)
GLUCOSE: 134 mg/dL — AB (ref 70–99)
POTASSIUM: 4.4 meq/L (ref 3.5–5.1)
Sodium: 137 mEq/L (ref 135–145)
Total Protein: 7.5 g/dL (ref 6.0–8.3)

## 2016-03-31 LAB — HEMOGLOBIN A1C: Hgb A1c MFr Bld: 8 % — ABNORMAL HIGH (ref 4.6–6.5)

## 2016-03-31 LAB — TSH: TSH: 2.93 u[IU]/mL (ref 0.35–4.50)

## 2016-03-31 LAB — VITAMIN B12: Vitamin B-12: 393 pg/mL (ref 211–911)

## 2016-03-31 MED ORDER — BLOOD GLUCOSE MONITOR KIT: PACK | Status: DC

## 2016-03-31 NOTE — Progress Notes (Signed)
Subjective:    Patient ID: Nathan Howard, male    DOB: March 26, 1941, 75 y.o.   MRN: [id number]  HPI He is here to establish with a new pcp.   He is here for follow up.  Hypertension: He is taking his medication daily. He is compliant with a low sodium diet.  He denies chest pain, palpitations, edema and regular headaches. He is not exercising regularly.  He does not monitor his blood pressure at home.     GERD:  He is taking his medication daily as prescribed.  He denies any GERD symptoms and feels his GERD is well controlled.   Diabetes: He is taking his medication daily as prescribed. He is compliant with a diabetic diet. He is not exercising regularly. He Has not been monitoring his sugars-he needs a new glucometer.   Elevated TSH: His TSH was elevated recently and he was started on levothyroxine, but he never actually took the medication. He was unsure if he needed to take it at this time.  Renal insufficiency: He has a history of renal insufficiency. He states he does drink a lot of coffee in the morning and probably drink more water. He does drink sweet tea when he goes out. He takes Aleve on occasion, but not daily.  Medications and allergies reviewed with patient and updated if appropriate.  Patient Active Problem List   Diagnosis Date Noted  . Basal cell carcinoma 11/29/2015  . Elevated TSH 11/11/2015  . Tongue lesion 05/16/2015  . Pulmonary fibrosis ([place]) 04/05/2013  . Renal insufficiency 01/02/2011  . ANEMIA, B12 DEFICIENCY 07/02/2010  . CERVICALGIA 04/08/2010  . Type 2 diabetes, uncontrolled, with renal manifestation ([place]) 09/15/2008  . PERNICIOUS ANEMIA 06/20/2008  . Allergic rhinitis, cause unspecified 06/20/2008  . Hyperlipidemia 05/03/2008  . Essential hypertension 05/03/2008  . GERD 05/03/2008  . Diverticulosis of large intestine 05/03/2008  . 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 COLONIC POLYPS, HX OF 05/03/2008    Current  Outpatient Prescriptions on File Prior to Visit  Medication Sig Dispense Refill  . acetaminophen (TYLENOL) 500 MG tablet Take 500 mg by mouth every 6 (six) hours as needed.      Marland Kitchen aspirin 81 MG tablet Take 81 mg by mouth daily.      Marland Kitchen atenolol (TENORMIN) 25 MG tablet TAKE 1/2 TABLET BY MOUTH EVERY MORNING. 45 tablet 3  . fenofibrate (TRICOR) 145 MG tablet TAKE ONE TABLET BY MOUTH ONCE DAILY. 90 tablet 3  . fexofenadine (ALLEGRA) 180 MG tablet Take 180 mg by mouth daily.    Marland Kitchen glimepiride (AMARYL) 2 MG tablet TAKE 1/2 TABLET BY MOUTH ONCE DAILY FOR DIABETES. 45 tablet 3  . losartan (COZAAR) 100 MG tablet Take 1 tablet (100 mg total) by mouth daily. 90 tablet 2  . metFORMIN (GLUCOPHAGE-XR) 500 MG 24 hr tablet TAKE 1 TABLET BY MOUTH DAILY WITH BREAKFAST. 90 tablet 3  . mometasone (ELOCON) 0.1 % ointment Apply topically 2 (two) times daily. 45 g 0  . Multiple Vitamins-Minerals (MULTI COMPLETE PO) Take by mouth daily.     Current Facility-Administered Medications on File Prior to Visit  Medication Dose Route Frequency Provider Last Rate Last Dose  . cyanocobalamin ((VITAMIN B-12)) injection 1,000 mcg  1,000 mcg Intramuscular Once Hendricks Limes, MD        Past Medical History  Diagnosis Date  . Personal history of colonic polyps 2000    adenomatous polyp Dr Velora Heckler  . Type  II or unspecified type diabetes mellitus without mention of complication, not stated as uncontrolled   . Diverticulosis of colon (without mention of hemorrhage)   . Esophageal reflux   . Hyperlipemia   . Hypertension   . Vitamin B12 deficiency   . Anal fissure   . Anemia   . Arthritis   . Skin cancer     right ear  . Hemorrhoids   . Pneumonia   . Colitis 2011    Dr Sharlett Iles  . Renal insufficiency   . Pneumonia      X 2  , as child  & 1974    Past Surgical History  Procedure Laterality Date  . Appendectomy  1953  . Cataract extraction      OD  . Hemorroidectomy  1960, 1975    x 2  . Tonsillectomy  1961    . Arm surgery      benign growth removed left arm  . Colonoscopy w/ polypectomy      Dr Sharlett Iles  . Upper gastrointestinal endoscopy      gastritis; Dr Sharlett Iles  . Skin biopsy Right 2014    benign    Social History   Social History  . Marital Status: Married    Spouse Name: N/A  . Number of Children: 0  . Years of Education: N/A   Occupational History  . retired Company secretary    Social History Main Topics  . Smoking status: Former Smoker -- 1.00 packs/day for 3 years    Types: Cigarettes    Quit date: 12/22/1961  . Smokeless tobacco: Never Used  . Alcohol Use: No  . Drug Use: No  . Sexual Activity: Not Asked   Other Topics Concern  . None   Social History Narrative    Family History  Problem Relation Age of Onset  . Heart attack Father 75  . Alcohol abuse Father   . Diabetes Mother   . Heart attack Mother 33  . Dementia Mother   . Stomach cancer Maternal Uncle   . Cancer Maternal Aunt     ? primary  . Cancer Paternal Aunt     ? primary  . Cancer Paternal Uncle     X2 ; prostate& colon  . Heart disease Maternal Grandfather     ? MI  . Arthritis Paternal Grandfather   . Stroke Neg Hx   . Lung cancer Maternal Grandmother     smoked    Review of Systems  Constitutional: Negative for fever and chills.  Respiratory: Positive for shortness of breath (with exertion). Negative for cough and wheezing.   Cardiovascular: Negative for chest pain, palpitations and leg swelling.  Neurological: Positive for light-headedness (occasional). Negative for headaches.       Objective:   Filed Vitals:   03/31/16 1408  BP: 130/80  Pulse: 66  Temp: 98.3 F (36.8 C)  Resp: 16   Filed Weights   03/31/16 1408  Weight: 210 lb (95.255 kg)   Body mass index is 29.7 kg/(m^2).   Physical Exam Constitutional: Appears well-developed and well-nourished. No distress.  Neck: Neck supple. No tracheal deviation present. No thyromegaly present.  No carotid bruit. No cervical  adenopathy.   Cardiovascular: Normal rate, regular rhythm and normal heart sounds.   No murmur heard.  No edema Pulmonary/Chest: Effort normal.No respiratory distress. No wheezes.  dry crackles at bases bilaterally.         Assessment & Plan:    See Problem List for Assessment and  Plan of chronic medical problems.  Follow-up in 6 months

## 2016-03-31 NOTE — Assessment & Plan Note (Signed)
Discussed decreased kidney function Increase water intake Avoid NSAIDs Check CMP

## 2016-03-31 NOTE — Progress Notes (Signed)
Pre visit review using our clinic review tool, if applicable. No additional management support is needed unless otherwise documented below in the visit note. 

## 2016-03-31 NOTE — Assessment & Plan Note (Signed)
BP well controlled Current regimen effective and well tolerated Continue current medications at current doses Check CMP 

## 2016-03-31 NOTE — Assessment & Plan Note (Signed)
Was started on Pepcid by pulmonary, but is not taking Taking omeprazole daily GERD controlled

## 2016-03-31 NOTE — Assessment & Plan Note (Signed)
TSH was elevated and started on levothyroxine, which he did not take Most recent TSH in normal range without medication No need for medication at this time-we'll just monitor TSH

## 2016-03-31 NOTE — Assessment & Plan Note (Signed)
Last A1c elevated-we will recheck Stressed regular exercise and dietary changes-he has been less compliant with a diabetic diet Currently taking glimepiride 1 mg daily-may need to adjust or change to a different medication Glucometer prescription given; monitor at home Follow up in 6 months

## 2016-03-31 NOTE — Patient Instructions (Addendum)
Check your blood pressure regularly at home.   Test(s) ordered today. Your results will be released to [place] (or called to you) after review, usually within 72hours after test completion. If any changes need to be made, you will be notified at that same time.  All other Health Maintenance issues reviewed.   All recommended immunizations and age-appropriate screenings are up-to-date or discussed.  No immunizations administered today.   Medications reviewed and updated.  No changes recommended at this time.   Please followup in 6 months

## 2016-03-31 NOTE — Assessment & Plan Note (Signed)
History of B12 deficiency Check B12 level-currently not taking B12

## 2016-06-25 ENCOUNTER — Encounter: Payer: Self-pay | Admitting: Internal Medicine

## 2016-06-25 ENCOUNTER — Ambulatory Visit (INDEPENDENT_AMBULATORY_CARE_PROVIDER_SITE_OTHER): Payer: Medicare Other | Admitting: Internal Medicine

## 2016-06-25 VITALS — BP 128/78 | HR 82 | Ht 70.5 in | Wt 206.0 lb

## 2016-06-25 DIAGNOSIS — J8410 Pulmonary fibrosis, unspecified: Secondary | ICD-10-CM | POA: Diagnosis not present

## 2016-06-25 DIAGNOSIS — R938 Abnormal findings on diagnostic imaging of other specified body structures: Secondary | ICD-10-CM

## 2016-06-25 DIAGNOSIS — R9389 Abnormal findings on diagnostic imaging of other specified body structures: Secondary | ICD-10-CM

## 2016-06-25 LAB — PULMONARY FUNCTION TEST
DL/VA % PRED: 72 %
DL/VA: 3.37 ml/min/mmHg/L
DLCO UNC: 17.69 ml/min/mmHg
DLCO cor % pred: 51 %
DLCO cor: 17.09 ml/min/mmHg
DLCO unc % pred: 53 %
FEF 25-75 Post: 4.7 L/sec
FEF 25-75 Pre: 4.05 L/sec
FEF2575-%CHANGE-POST: 16 %
FEF2575-%Pred-Post: 218 %
FEF2575-%Pred-Pre: 188 %
FEV1-%Change-Post: 3 %
FEV1-%Pred-Post: 109 %
FEV1-%Pred-Pre: 106 %
FEV1-PRE: 3.23 L
FEV1-Post: 3.33 L
FEV1FVC-%Change-Post: 4 %
FEV1FVC-%Pred-Pre: 117 %
FEV6-%Change-Post: 0 %
FEV6-%PRED-POST: 95 %
FEV6-%Pred-Pre: 96 %
FEV6-Post: 3.78 L
FEV6-Pre: 3.81 L
FEV6FVC-%CHANGE-POST: 0 %
FEV6FVC-%Pred-Post: 106 %
FEV6FVC-%Pred-Pre: 105 %
FVC-%CHANGE-POST: -1 %
FVC-%PRED-POST: 89 %
FVC-%Pred-Pre: 90 %
FVC-Post: 3.78 L
FVC-Pre: 3.83 L
Post FEV1/FVC ratio: 88 %
Post FEV6/FVC ratio: 100 %
Pre FEV1/FVC ratio: 84 %
Pre FEV6/FVC Ratio: 100 %
RV % PRED: 54 %
RV: 1.44 L
TLC % pred: 74 %
TLC: 5.29 L

## 2016-06-25 NOTE — Progress Notes (Signed)
PFT done today. 

## 2016-06-25 NOTE — Assessment & Plan Note (Signed)
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 -Smoking history encompassed less than 5 years. He's had no significant occupational exposures - 02/26/2015  Walked RA x 3 laps @ 185 ft each stopped due to  End of study , nl pace, no desats - 02/26/2015 collagen vasc screen sent > neg with esr 28  - 02/26/2015 rec max gerd rx/ diet and > improved 05/16/15  - 06/28/2015  VC 3.65 (85%) no obst and dlco 56 corrects  to 81  - 06/28/2015  Walked RA x 3 laps @ 185 ft each stopped due to End of study, nl pace, no sob or desat  - PFT's  06/25/2016   VC  3.85  (91%)  DLCO  53/51 % corrects to 72 % for alv volume   His lung vol and dlco are overall unchanged x one year on gerd rx though has concerns bout press reports.    Reviewed with pt: 1) unusual for PF to maintain same /better VC and no change dlco over 1 year's time and during this year the only intervention was GERD rx  2) Use of PPI is associated with improved survival time and with decreased radiologic fibrosis per King's study published in AJRCCM vol 184 p1390.  Dec 2011 and also may have other beneficial effects as per the latest review in [place] vol 193 J9597 Jun 20016.  This may not always be cause and effect, but given how universally unimpressive and expensive  all the other  Drugs developed to date  have been for pf,   rec  rx ppi / diet/ lifestyle modification and f/u with serial walking sats and lung volumes for now to put more points on the curve / establish firm baseline before considering additional measures.   3) Discussed the recent press about ppi's in the context of a statistically significant (but questionably clinically relevant) increase in CRI in pts on ppi vs h2's > bottom line is the lowest dose of ppi that controls   gerd is the right dose and if that dose is zero that's fine esp since h2's are cheaper but for now no evidence of adverse effects of ppi so continue 20 mg omeprazole and pepcid 20 mg qhs  4) Each  maintenance medication was reviewed in detail including most importantly the difference between maintenance and as needed and under what circumstances the prns are to be used.  Please see instructions for details which were reviewed in writing and the patient given a copy.

## 2016-06-25 NOTE — Patient Instructions (Addendum)
Continue prilosec otc 20mg   Take 30-60 min before first meal of the day and Pepcid ac (famotidine) 20 mg one @  bedtime    Please schedule a follow up office visit in 6 months  call sooner if needed

## 2016-06-25 NOTE — Progress Notes (Signed)
Subjective:   Patient ID: Nathan Howard, male    DOB: [birth year],   MRN: [id number]  Brief patient profile:  71 yowm quit smoking 1960s retired Company secretary apparently newly dx with PF 02/2013 by cxr (no cxr on file prior to that)   and noted much more difficulty with doe shoveling snow winter of 2016 so referred to pulmonary clinic by Dr Linna Darner 02/26/2015    History of Present Illness  02/26/2015 1st [facility] office visit/ Nathan Howard   Chief Complaint  Patient presents with  . Pulmonary Consult    Referred by Dr. Unice Cobble. Pt states having SOB since Dec 2015 after developing URI. He gets SOB with doing yard work and with singing. He states that he also has some PND and occ cough with white to clear sputum.   abruptly ill week before xmas, sense of acute sinus congestion, sore throat chest burning during severe coughing fits assoc with infected tooth rx with clindamycin x 40 days and tooth better, all symptoms improved except now doe x flight of steps or yardwork or fast walking. rec Change prilosec to omeprazole 40 mg Take 30-60 min before first meal of the day and Pepcid ac 20 at bedtime until return GERD diet   12/27/2015  f/u ov/Nathan Howard re: PF/ no change ex tol Chief Complaint  Patient presents with  . Follow-up    CXR done today. Breathing is unchanged. No new co's today.   rec No change rx = ppi qam/ h2hs    06/25/2016  f/u ov/Nathan Howard re: PF/ maint on prilosec but worried about press so stopped pepcid Chief Complaint  Patient presents with  . Follow-up    PFT done today. Breathing is overall doing well.    able to work in the yard ok, some trouble bending over at waist, no limiting doe  No obvious day to day or daytime variabilty or assoc cough or cp or chest tightness, subjective wheeze overt sinus or hb symptoms. No unusual exp hx or h/o childhood pna/ asthma or knowledge of premature birth.  Sleeping ok without nocturnal  or early am exacerbation  of respiratory  c/o's or  need for noct saba. Also denies any obvious fluctuation of symptoms with weather or environmental changes or other aggravating or alleviating factors except as outlined above   Current Medications, Allergies, Complete Past Medical History, Past Surgical History, Family History, and Social History were reviewed in [employer] record.  ROS  The following are not active complaints unless bolded sore throat, dysphagia, dental problems, itching, sneezing,  nasal congestion or excess/ purulent secretions, ear ache,   fever, chills, sweats, unintended wt loss, pleuritic or exertional cp, hemoptysis,  orthopnea pnd or leg swelling, presyncope, palpitations, heartburn, abdominal pain, anorexia, nausea, vomiting, diarrhea  or change in bowel or urinary habits, change in stools or urine, dysuria,hematuria,  rash, arthralgias, visual complaints, headache, numbness weakness or ataxia or problems with walking or coordination,  change in mood/affect or memory.            Objective:   Physical Exam  amb wm nad  05/16/2015        209 > 06/28/2015    213  > 12/27/2015   211 > 06/25/2016  206     02/26/15 213 lb (96.616 kg)  02/12/15 213 lb 12 oz (96.956 kg)  04/11/14 216 lb 6.4 oz (98.158 kg)    Vital signs reviewed   HEENT: nl dentition, turbinates, and orophanx. Nl external  ear canals without cough reflex    NECK :  without JVD/Nodes/TM/ nl carotid upstrokes bilaterally   LUNGS: no acc muscle use,  bilateral insp crackles  - no longer cough on deep breath    CV:  RRR  no s3 or murmur or increase in P2, no edema   ABD:  soft and nontender with nl excursion in the supine position. No bruits or organomegaly, bowel sounds nl  MS:  warm without deformities, calf tenderness, cyanosis or clubbing  SKIN: warm and dry without lesions    NEURO:  alert, approp, no deficits       CXR PA and Lateral:   12/27/2015 :    I personally reviewed images and agree with radiology impression  as follows:   1. Findings most consistent chronic interstitial lung disease. Active interstitial pneumonitis cannot be excluded. 2. Stable cardiomegaly            Assessment & Plan:

## 2016-06-26 ENCOUNTER — Telehealth: Payer: Self-pay

## 2016-06-26 NOTE — Telephone Encounter (Signed)
Patient is on the list for [employer] 2017 and may be a good candidate for an AWV in 2017. Please let me know if/when appt is scheduled.   

## 2016-06-27 ENCOUNTER — Telehealth: Payer: Self-pay

## 2016-06-27 NOTE — Telephone Encounter (Signed)
Error

## 2016-08-06 ENCOUNTER — Other Ambulatory Visit: Payer: Self-pay | Admitting: Internal Medicine

## 2016-09-30 ENCOUNTER — Ambulatory Visit (INDEPENDENT_AMBULATORY_CARE_PROVIDER_SITE_OTHER): Payer: Medicare Other | Admitting: Internal Medicine

## 2016-09-30 ENCOUNTER — Encounter: Payer: Self-pay | Admitting: Internal Medicine

## 2016-09-30 VITALS — BP 126/74 | HR 71 | Temp 97.8°F | Resp 16 | Ht 71.0 in | Wt 206.0 lb

## 2016-09-30 DIAGNOSIS — E785 Hyperlipidemia, unspecified: Secondary | ICD-10-CM | POA: Diagnosis not present

## 2016-09-30 DIAGNOSIS — N289 Disorder of kidney and ureter, unspecified: Secondary | ICD-10-CM

## 2016-09-30 DIAGNOSIS — E1129 Type 2 diabetes mellitus with other diabetic kidney complication: Secondary | ICD-10-CM

## 2016-09-30 DIAGNOSIS — E1165 Type 2 diabetes mellitus with hyperglycemia: Secondary | ICD-10-CM

## 2016-09-30 DIAGNOSIS — Z23 Encounter for immunization: Secondary | ICD-10-CM | POA: Diagnosis not present

## 2016-09-30 DIAGNOSIS — IMO0002 Reserved for concepts with insufficient information to code with codable children: Secondary | ICD-10-CM

## 2016-09-30 DIAGNOSIS — R7989 Other specified abnormal findings of blood chemistry: Secondary | ICD-10-CM

## 2016-09-30 DIAGNOSIS — I10 Essential (primary) hypertension: Secondary | ICD-10-CM | POA: Diagnosis not present

## 2016-09-30 DIAGNOSIS — R946 Abnormal results of thyroid function studies: Secondary | ICD-10-CM

## 2016-09-30 DIAGNOSIS — D518 Other vitamin B12 deficiency anemias: Secondary | ICD-10-CM

## 2016-09-30 NOTE — Assessment & Plan Note (Signed)
Check lipid panel, cmp Continue fenofibrate

## 2016-09-30 NOTE — Patient Instructions (Addendum)
  Test(s) ordered today. Your results will be released to MyChart (or called to you) after review, usually within 72hours after test completion. If any changes need to be made, you will be notified at that same time.  All other Health Maintenance issues reviewed.   All recommended immunizations and age-appropriate screenings are up-to-date or discussed.  Flu vaccine administered today.   Medications reviewed and updated.  No changes recommended at this time.  Your prescription(s) have been submitted to your pharmacy. Please take as directed and contact our office if you believe you are having problem(s) with the medication(s).   Please followup in 6 months   

## 2016-09-30 NOTE — Progress Notes (Signed)
Subjective:    Patient ID: Nathan Howard, male    DOB: 1941/10/15, 75 y.o.   MRN: [id number]  HPI The patient is here for follow up.  His diet has been been inconsistent due to dental work.    He is not exercising much at this time.   Diabetes: He is taking his medication daily as prescribed. He is compliant with a diabetic diet. He is not exercising regularly. He monitors his sugars and they have been running 169 this morning, one minute later it was 195 - he plans on having it calibrated. He checks his feet daily and denies foot lesions. He is up-to-date with an ophthalmology examination.   Hyperlipidemia: He is taking his medication daily. He is compliant with a low fat/cholesterol diet. He is not exercising regularly. He denies myalgias.   Renal insufficiency:  He is drinking more water that he hs in the past, but drinks a lot of coffee.  He took ibuprofen with his recent dental pain.  He takes aleve on occasion.  He takes tylenol as needed.    Hypertension: He is taking his medication daily. He is compliant with a low sodium diet.  He denies chest pain, palpitations, edema, shortness of breath and regular headaches. He is not exercising regularly.  He does not monitor his blood pressure at home.     Medications and allergies reviewed with patient and updated if appropriate.  Patient Active Problem List   Diagnosis Date Noted  . Basal cell carcinoma 11/29/2015  . Elevated TSH 11/11/2015  . Tongue lesion 05/16/2015  . Pulmonary fibrosis ([place]) 04/05/2013  . Renal insufficiency 01/02/2011  . ANEMIA, B12 DEFICIENCY 07/02/2010  . CERVICALGIA 04/08/2010  . Type 2 diabetes, uncontrolled, with renal manifestation ([place]) 09/15/2008  . PERNICIOUS ANEMIA 06/20/2008  . Allergic rhinitis, cause unspecified 06/20/2008  . Hyperlipidemia 05/03/2008  . Essential hypertension 05/03/2008  . GERD 05/03/2008  . Diverticulosis of large intestine 05/03/2008  . HYPERPLASIA PROSTATE UNS W/UR  OBST & OTH LUTS 05/03/2008  . COLONIC POLYPS, HX OF 05/03/2008    Current Outpatient Prescriptions on File Prior to Visit  Medication Sig Dispense Refill  . acetaminophen (TYLENOL) 500 MG tablet Take 500 mg by mouth every 6 (six) hours as needed.      Marland Kitchen aspirin 81 MG tablet Take 81 mg by mouth daily.      Marland Kitchen atenolol (TENORMIN) 25 MG tablet TAKE 1/2 TABLET BY MOUTH EVERY MORNING. 45 tablet 3  . Chlorpheniramine-Acetaminophen (CORICIDIN HBP COLD/FLU PO) Per bottle directions as needed    . fenofibrate (TRICOR) 145 MG tablet TAKE ONE TABLET BY MOUTH ONCE DAILY. 90 tablet 3  . fexofenadine (ALLEGRA) 180 MG tablet Take 180 mg by mouth daily as needed.     Marland Kitchen glimepiride (AMARYL) 2 MG tablet TAKE 1/2 TABLET BY MOUTH ONCE DAILY FOR DIABETES. 45 tablet 3  . losartan (COZAAR) 100 MG tablet Take 1 tablet (100 mg total) by mouth daily. 90 tablet 2  . Multiple Vitamins-Minerals (MULTI COMPLETE PO) Take by mouth daily.    Marland Kitchen omeprazole (PRILOSEC) 20 MG capsule Take 20 mg by mouth daily.     Current Facility-Administered Medications on File Prior to Visit  Medication Dose Route Frequency Provider Last Rate Last Dose  . cyanocobalamin ((VITAMIN B-12)) injection 1,000 mcg  1,000 mcg Intramuscular Once Hendricks Limes, MD        Past Medical History:  Diagnosis Date  . Anal fissure   . Anemia   .  Arthritis   . Colitis 2011   Dr Sharlett Iles  . Diverticulosis of colon (without mention of hemorrhage)   . Esophageal reflux   . Hemorrhoids   . Hyperlipemia   . Hypertension   . Personal history of colonic polyps 2000   adenomatous polyp Dr Velora Heckler  . Pneumonia   . Pneumonia     X 2  , as child  & 1974  . Renal insufficiency   . Skin cancer    right ear  . Type II or unspecified type diabetes mellitus without mention of complication, not stated as uncontrolled   . Vitamin B12 deficiency     Past Surgical History:  Procedure Laterality Date  . APPENDECTOMY  1953  . arm surgery     benign growth  removed left arm  . CATARACT EXTRACTION     OD  . COLONOSCOPY W/ POLYPECTOMY     Dr Sharlett Iles  . [place]   x 2  . SKIN BIOPSY Right 2014   benign  . TONSILLECTOMY  1961  . UPPER GASTROINTESTINAL ENDOSCOPY     gastritis; Dr Sharlett Iles    Social History   Social History  . Marital status: Married    Spouse name: N/A  . Number of children: 0  . Years of education: N/A   Occupational History  . retired [employer] logistics/support/administrative officer   Social History Main Topics  . Smoking status: Former Smoker    Packs/day: 1.00    Years: 3.00    Types: Cigarettes    Quit date: 12/22/1961  . Smokeless tobacco: Never Used  . Alcohol use No  . Drug use: No  . Sexual activity: Not on file   Other Topics Concern  . Not on file   Social History Narrative  . No narrative on file    Family History  Problem Relation Age of Onset  . Heart attack Father 37  . Alcohol abuse Father   . Diabetes Mother   . Heart attack Mother 65  . Dementia Mother   . Stomach cancer Maternal Uncle   . Cancer Maternal Aunt     ? primary  . Cancer Paternal Aunt     ? primary  . Cancer Paternal Uncle     X2 ; prostate& colon  . Heart disease Maternal Grandfather     ? MI  . Arthritis Paternal Grandfather   . Stroke Neg Hx   . Lung cancer Maternal Grandmother     smoked    Review of Systems  Constitutional: Negative for chills and fever.  Respiratory: Negative for cough, shortness of breath and wheezing.   Cardiovascular: Negative for chest pain, palpitations and leg swelling.  Neurological: Negative for dizziness, light-headedness, numbness and headaches.       Objective:   Vitals:   09/30/16 1328  BP: 126/74  Pulse: 71  Resp: 16  Temp: 97.8 F (36.6 C)   Filed Weights   09/30/16 1328  Weight: 206 lb (93.4 kg)   Body mass index is 28.73 kg/m.   Physical Exam    Constitutional: Appears well-developed and well-nourished. No distress.  HENT:  Head: Normocephalic and  atraumatic.  Neck: Neck supple. No tracheal deviation present. No thyromegaly present.  No cervical lymphadenopathy Cardiovascular: Normal rate, regular rhythm and normal heart sounds.   No murmur heard. No carotid bruit .  No edema Pulmonary/Chest:  No respiratory distress. No has no wheezes. Dry crackles in left lung base. No other crackles noted  Skin: Skin is warm and dry. Not diaphoretic.  Psychiatric: Normal mood and affect. Behavior is normal.      Assessment & Plan:    See Problem List for Assessment and Plan of chronic medical problems.

## 2016-09-30 NOTE — Progress Notes (Signed)
Pre visit review using our clinic review tool, if applicable. No additional management support is needed unless otherwise documented below in the visit note. 

## 2016-09-30 NOTE — Assessment & Plan Note (Signed)
Check tsh 

## 2016-09-30 NOTE — Assessment & Plan Note (Addendum)
BP well controlled Current regimen effective and well tolerated Continue current medications at current doses cmp  

## 2016-09-30 NOTE — Assessment & Plan Note (Addendum)
Check a1c, urine micro Diet has not been very good with his diet Stressed regular exercise

## 2016-09-30 NOTE — Assessment & Plan Note (Signed)
cmp

## 2016-10-28 LAB — HM DIABETES EYE EXAM

## 2016-11-05 ENCOUNTER — Other Ambulatory Visit (INDEPENDENT_AMBULATORY_CARE_PROVIDER_SITE_OTHER): Payer: Medicare Other

## 2016-11-05 DIAGNOSIS — R946 Abnormal results of thyroid function studies: Secondary | ICD-10-CM

## 2016-11-05 DIAGNOSIS — E785 Hyperlipidemia, unspecified: Secondary | ICD-10-CM | POA: Diagnosis not present

## 2016-11-05 DIAGNOSIS — E1129 Type 2 diabetes mellitus with other diabetic kidney complication: Secondary | ICD-10-CM

## 2016-11-05 DIAGNOSIS — D518 Other vitamin B12 deficiency anemias: Secondary | ICD-10-CM

## 2016-11-05 DIAGNOSIS — IMO0002 Reserved for concepts with insufficient information to code with codable children: Secondary | ICD-10-CM

## 2016-11-05 DIAGNOSIS — I10 Essential (primary) hypertension: Secondary | ICD-10-CM | POA: Diagnosis not present

## 2016-11-05 DIAGNOSIS — N289 Disorder of kidney and ureter, unspecified: Secondary | ICD-10-CM

## 2016-11-05 DIAGNOSIS — E1165 Type 2 diabetes mellitus with hyperglycemia: Secondary | ICD-10-CM

## 2016-11-05 DIAGNOSIS — R7989 Other specified abnormal findings of blood chemistry: Secondary | ICD-10-CM

## 2016-11-05 DIAGNOSIS — Z23 Encounter for immunization: Secondary | ICD-10-CM | POA: Diagnosis not present

## 2016-11-05 LAB — COMPREHENSIVE METABOLIC PANEL
ALK PHOS: 33 U/L — AB (ref 39–117)
ALT: 14 U/L (ref 0–53)
AST: 21 U/L (ref 0–37)
Albumin: 4.4 g/dL (ref 3.5–5.2)
BUN: 22 mg/dL (ref 6–23)
CHLORIDE: 103 meq/L (ref 96–112)
CO2: 25 mEq/L (ref 19–32)
Calcium: 9.8 mg/dL (ref 8.4–10.5)
Creatinine, Ser: 1.54 mg/dL — ABNORMAL HIGH (ref 0.40–1.50)
GFR: 46.7 mL/min — AB (ref >60.00)
GLUCOSE: 161 mg/dL — AB (ref 70–99)
POTASSIUM: 4.5 meq/L (ref 3.5–5.1)
SODIUM: 137 meq/L (ref 135–145)
TOTAL PROTEIN: 7.8 g/dL (ref 6.0–8.3)
Total Bilirubin: 0.6 mg/dL (ref 0.2–1.2)

## 2016-11-05 LAB — LIPID PANEL
CHOLESTEROL: 169 mg/dL (ref 0–200)
HDL: 34 mg/dL — ABNORMAL LOW (ref >39.00)
LDL Cholesterol: 99 mg/dL (ref 0–99)
NonHDL: 134.81
TRIGLYCERIDES: 181 mg/dL — AB (ref 0.0–149.0)
Total CHOL/HDL Ratio: 5
VLDL: 36.2 mg/dL (ref 0.0–40.0)

## 2016-11-05 LAB — MICROALBUMIN / CREATININE URINE RATIO
CREATININE, U: 141.4 mg/dL
MICROALB/CREAT RATIO: 0.5 mg/g (ref 0.0–30.0)
Microalb, Ur: <0.7 mg/dL (ref 0.0–1.9)

## 2016-11-05 LAB — HEMOGLOBIN A1C: HEMOGLOBIN A1C: 7.1 % — AB (ref 4.6–6.5)

## 2016-11-05 LAB — TSH: TSH: 3.69 u[IU]/mL (ref 0.35–4.50)

## 2016-11-05 LAB — VITAMIN B12: VITAMIN B 12: 317 pg/mL (ref 211–911)

## 2016-11-06 ENCOUNTER — Other Ambulatory Visit: Payer: Self-pay | Admitting: Internal Medicine

## 2016-11-06 ENCOUNTER — Encounter: Payer: Self-pay | Admitting: Internal Medicine

## 2016-11-14 ENCOUNTER — Other Ambulatory Visit: Payer: Self-pay | Admitting: Internal Medicine

## 2016-11-22 MED ORDER — LOSARTAN POTASSIUM 100 MG PO TABS: 100.0000 mg | ORAL_TABLET | Freq: Every day | ORAL | 2 refills | Status: DC

## 2016-11-22 NOTE — Addendum Note (Signed)
Addended by: Aviva Signs M on: 11/22/2016 12:48 PM   Modules accepted: Orders

## 2016-12-26 ENCOUNTER — Ambulatory Visit (INDEPENDENT_AMBULATORY_CARE_PROVIDER_SITE_OTHER): Payer: Medicare Other | Admitting: Internal Medicine

## 2016-12-26 ENCOUNTER — Ambulatory Visit (INDEPENDENT_AMBULATORY_CARE_PROVIDER_SITE_OTHER)
Admission: RE | Admit: 2016-12-26 | Discharge: 2016-12-26 | Disposition: A | Discharge status: Home / Self Care | Payer: Medicare Other | Source: Ambulatory Visit | Attending: Internal Medicine | Admitting: Internal Medicine

## 2016-12-26 ENCOUNTER — Encounter: Payer: Self-pay | Admitting: Internal Medicine

## 2016-12-26 ENCOUNTER — Other Ambulatory Visit (INDEPENDENT_AMBULATORY_CARE_PROVIDER_SITE_OTHER): Payer: Medicare Other

## 2016-12-26 VITALS — BP 118/68 | HR 86 | Ht 70.0 in | Wt 203.4 lb

## 2016-12-26 DIAGNOSIS — D518 Other vitamin B12 deficiency anemias: Secondary | ICD-10-CM

## 2016-12-26 DIAGNOSIS — J8410 Pulmonary fibrosis, unspecified: Secondary | ICD-10-CM | POA: Diagnosis not present

## 2016-12-26 LAB — CBC WITH DIFFERENTIAL/PLATELET
Basophils Absolute: 0 10*3/uL (ref 0.0–0.1)
Basophils Relative: 0.5 % (ref 0.0–3.0)
EOS ABS: 0.4 10*3/uL (ref 0.0–0.7)
EOS PCT: 7.6 % — AB (ref 0.0–5.0)
HCT: 37.4 % — ABNORMAL LOW (ref 39.0–52.0)
Hemoglobin: 12.8 g/dL — ABNORMAL LOW (ref 13.0–17.0)
LYMPHS ABS: 1.7 10*3/uL (ref 0.7–4.0)
Lymphocytes Relative: 30 % (ref 12.0–46.0)
MCHC: 34.1 g/dL (ref 30.0–36.0)
MCV: 88.4 fl (ref 78.0–100.0)
MONO ABS: 0.4 10*3/uL (ref 0.1–1.0)
Monocytes Relative: 7.4 % (ref 3.0–12.0)
NEUTROS PCT: 54.5 % (ref 43.0–77.0)
Neutro Abs: 3.2 10*3/uL (ref 1.4–7.7)
Platelets: 268 10*3/uL (ref 150.0–400.0)
RBC: 4.23 Mil/uL (ref 4.22–5.81)
RDW: 14.3 % (ref 11.5–15.5)
WBC: 5.8 10*3/uL (ref 4.0–10.5)

## 2016-12-26 LAB — SEDIMENTATION RATE: SED RATE: 23 mm/h — AB (ref 0–20)

## 2016-12-26 LAB — VITAMIN B12: Vitamin B-12: 345 pg/mL (ref 211–911)

## 2016-12-26 MED ORDER — OMEPRAZOLE 20 MG PO CPDR: 20.0000 mg | DELAYED_RELEASE_CAPSULE | Freq: Every day | ORAL | 6 refills | Status: DC

## 2016-12-26 NOTE — Progress Notes (Signed)
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 Pulmonary Consult    Referred by Dr. Unice Cobble. Pt states having SOB since Dec 2015 after developing URI. He gets SOB with doing yard work and with singing. He states that he also has some PND and occ cough with white to clear sputum.   abruptly ill week before xmas, sense of acute sinus congestion, sore throat chest burning during severe coughing fits assoc with infected tooth rx with clindamycin x 40 days and tooth better, all symptoms improved except now doe x flight of steps or yardwork or fast walking. rec Change prilosec to omeprazole 40 mg Take 30-60 min before first meal of the day and Pepcid ac 20 at bedtime until return GERD diet   12/27/2015  f/u ov/Nathan Howard re: PF/ no change ex tol Chief Complaint  Patient presents with  . Follow-up    CXR done today. Breathing is unchanged. No new co's today.   rec No change rx = ppi qam/ h2hs    06/25/2016  f/u ov/Nathan Howard re: PF/ maint on prilosec but worried about press so stopped pepcid Chief Complaint  Patient presents with  . Follow-up    PFT done today. Breathing is overall doing well.    able to work in the yard ok, some trouble bending over at waist, no limiting doe rec Continue prilosec otc 20mg   Take 30-60 min before first meal of the day and Pepcid ac (famotidine) 20 mg one @  bedtime       12/26/2016  f/u ov/Nathan Howard re: PF/ no longer on PPI due to concern with B12 absorption Chief Complaint  Patient presents with  . Follow-up    Breathing has been  worse with colder weather. He is not coughing much, but does have some PND.    now on pepcid 20 mg bid and worse since stopped ppi though no clear cause and effect Has sense of pnds but no excess/ purulent sputum or mucus plugs     No obvious day to day or daytime variabilty or assoc cp or chest tightness, subjective wheeze overt sinus or hb symptoms. No unusual exp hx or h/o childhood pna/ asthma or knowledge of premature birth.  Sleeping ok without nocturnal  or early am exacerbation  of respiratory  c/o's or need for noct saba. Also denies any obvious fluctuation of symptoms with weather or environmental changes or other aggravating or alleviating factors except as outlined above   Current Medications, Allergies, Complete Past Medical History, Past Surgical History, Family History, and Social History were reviewed in [employer] record.  ROS  The following are not active complaints unless bolded sore throat, dysphagia, dental problems, itching, sneezing,  nasal congestion or excess/ purulent secretions, ear ache,   fever, chills, sweats, unintended wt loss, pleuritic or exertional cp, hemoptysis,  orthopnea pnd or leg swelling, presyncope, palpitations, heartburn, abdominal pain, anorexia, nausea, vomiting, diarrhea  or change in bowel or urinary habits, change in stools or urine,  dysuria,hematuria,  rash, arthralgias, visual complaints, headache, numbness weakness or ataxia or problems with walking or coordination,  change in mood/affect or memory.            Objective:   Physical Exam  amb wm nad  05/16/2015        209 > 06/28/2015    213  > 12/27/2015   211 > 06/25/2016  206 > 12/26/2016  203    02/26/15 213 lb (96.616 kg)  02/12/15 213 lb 12 oz (96.956 kg)  04/11/14 216 lb 6.4 oz (98.158 kg)    Vital signs reviewed - Note on arrival 02 sats  97% on RA     HEENT: nl dentition, turbinates, and orophanx. Nl external ear canals without cough reflex    NECK :  without  JVD/Nodes/TM/ nl carotid upstrokes bilaterally   LUNGS: no acc muscle use,  bilateral insp crackles  - no   cough on deep breath    CV:  RRR  no s3 or murmur or increase in P2, no edema   ABD:  soft and nontender with nl excursion in the supine position. No bruits or organomegaly, bowel sounds nl  MS:  warm without deformities, calf tenderness, cyanosis or clubbing  SKIN: warm and dry without lesions    NEURO:  alert, approp, no deficits       CXR PA and Lateral:   12/26/2016 :    I personally reviewed images and agree with radiology impression as follows:    Diffuse bilateral pulmonary interstitial prominence again noted. No interim change. Findings consistent chronic interstitial lung disease. No acute abnormality.      Lab Results  Component Value Date   ESRSEDRATE 23 (H) 12/26/2016   ESRSEDRATE 28 (H) 02/26/2015   ESRSEDRATE 14 04/08/2010           Assessment & Plan:

## 2016-12-26 NOTE — Patient Instructions (Addendum)
Resume  prilosec otc 20mg   Take 30-60 min before first meal of the day and Pepcid ac (famotidine) 20 mg one @  bedtime until return     Please remember to go to the lab and x-ray department downstairs for your tests - we will call you with the results when they are available.  Please schedule a follow up visit in 6 months but call sooner if needed with pfts on return

## 2016-12-28 ENCOUNTER — Encounter: Payer: Self-pay | Admitting: Internal Medicine

## 2016-12-28 NOTE — Assessment & Plan Note (Signed)
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 -Smoking history encompassed less than 5 years. He's had no significant occupational exposures - 02/26/2015  Walked RA x 3 laps @ 185 ft each stopped due to  End of study , nl pace, no desats - 02/26/2015 collagen vasc screen sent > neg with esr 28  - 02/26/2015 rec max gerd rx/ diet and > improved 05/16/15  - 06/28/2015  VC 3.65 (85%) no obst and dlco 56 corrects  to 81  - 06/28/2015  Walked RA x 3 laps @ 185 ft each stopped due to End of study, nl pace, no sob or desat  - PFT's  06/25/2016   VC  3.85  (91%)  DLCO  53/51 % corrects to 72 % for alv volume  - 12/26/2016  Walked RA x 3 laps @ 185 ft each stopped due to  End of study, nl pace, no sob or desat  No evidence of worsening pf though symptoms worse off ppi   Discussed the recent press about ppi's in the context of a statistically significant (but questionably clinically relevant) increase in CRI in pts on ppi vs h2's > bottom line is the lowest dose of ppi that controls   gerd is the right dose and if that dose is zero that's fine esp since h2's are cheaper but given risk of affecting the PF strongly rec ppi qam and h2 hs   I had an extended discussion with the patient reviewing all relevant studies completed to date and  lasting 15 to 20 minutes of a 25 minute visit    Each maintenance medication was reviewed in detail including most importantly the difference between maintenance and prns and under what circumstances the prns are to be triggered using an action plan format that is not reflected in the computer generated alphabetically organized AVS.    Please see AVS for specific instructions unique to this visit that I personally wrote and verbalized to the the pt in detail and then reviewed with pt  by my nurse highlighting any  changes in therapy recommended at today's visit to their plan of care.

## 2016-12-28 NOTE — Assessment & Plan Note (Signed)
D/Ced parenteral B12 6/14; on oral supplements - B12 level/ cbc nl with nl mcv 12/26/2016 > resume ppi but take same po supplements

## 2016-12-29 NOTE — Progress Notes (Signed)
Spoke with pt and notified of results per Dr. Wert. Pt verbalized understanding and denied any questions. 

## 2017-01-21 ENCOUNTER — Emergency Department (HOSPITAL_COMMUNITY)
Admission: EM | Admit: 2017-01-21 | Discharge: 2017-01-21 | Disposition: A | Discharge status: Home / Self Care | Payer: Medicare Other | Attending: Emergency Medicine | Admitting: Emergency Medicine

## 2017-01-21 ENCOUNTER — Encounter (HOSPITAL_COMMUNITY): Payer: Self-pay

## 2017-01-21 ENCOUNTER — Emergency Department (HOSPITAL_COMMUNITY): Payer: Medicare Other

## 2017-01-21 DIAGNOSIS — E1129 Type 2 diabetes mellitus with other diabetic kidney complication: Secondary | ICD-10-CM | POA: Insufficient documentation

## 2017-01-21 DIAGNOSIS — Z7982 Long term (current) use of aspirin: Secondary | ICD-10-CM | POA: Diagnosis not present

## 2017-01-21 DIAGNOSIS — I10 Essential (primary) hypertension: Secondary | ICD-10-CM | POA: Insufficient documentation

## 2017-01-21 DIAGNOSIS — R091 Pleurisy: Secondary | ICD-10-CM | POA: Diagnosis not present

## 2017-01-21 DIAGNOSIS — Z79899 Other long term (current) drug therapy: Secondary | ICD-10-CM | POA: Insufficient documentation

## 2017-01-21 DIAGNOSIS — Z7984 Long term (current) use of oral hypoglycemic drugs: Secondary | ICD-10-CM | POA: Insufficient documentation

## 2017-01-21 DIAGNOSIS — Z87891 Personal history of nicotine dependence: Secondary | ICD-10-CM | POA: Diagnosis not present

## 2017-01-21 DIAGNOSIS — Z85828 Personal history of other malignant neoplasm of skin: Secondary | ICD-10-CM | POA: Insufficient documentation

## 2017-01-21 DIAGNOSIS — R071 Chest pain on breathing: Secondary | ICD-10-CM | POA: Diagnosis present

## 2017-01-21 LAB — BASIC METABOLIC PANEL
ANION GAP: 11 (ref 5–15)
BUN: 20 mg/dL (ref 6–20)
CHLORIDE: 99 mmol/L — AB (ref 101–111)
CO2: 23 mmol/L (ref 22–32)
Calcium: 9.8 mg/dL (ref 8.9–10.3)
Creatinine, Ser: 1.37 mg/dL — ABNORMAL HIGH (ref 0.61–1.24)
GFR calc non Af Amer: 48 mL/min — ABNORMAL LOW (ref >60)
GFR, EST AFRICAN AMERICAN: 56 mL/min — AB (ref >60)
Glucose, Bld: 159 mg/dL — ABNORMAL HIGH (ref 65–99)
POTASSIUM: 4.2 mmol/L (ref 3.5–5.1)
Sodium: 133 mmol/L — ABNORMAL LOW (ref 135–145)

## 2017-01-21 LAB — CBC
HCT: 42.6 % (ref 39.0–52.0)
HEMOGLOBIN: 13.9 g/dL (ref 13.0–17.0)
MCH: 29.7 pg (ref 26.0–34.0)
MCHC: 32.6 g/dL (ref 30.0–36.0)
MCV: 91 fL (ref 78.0–100.0)
Platelets: 227 10*3/uL (ref 150–400)
RBC: 4.68 MIL/uL (ref 4.22–5.81)
RDW: 14.2 % (ref 11.5–15.5)
WBC: 10.4 10*3/uL (ref 4.0–10.5)

## 2017-01-21 LAB — HEPATIC FUNCTION PANEL
ALBUMIN: 4.5 g/dL (ref 3.5–5.0)
ALK PHOS: 36 U/L — AB (ref 38–126)
ALT: 22 U/L (ref 17–63)
AST: 33 U/L (ref 15–41)
Bilirubin, Direct: 0.2 mg/dL (ref 0.1–0.5)
Indirect Bilirubin: 0.5 mg/dL (ref 0.3–0.9)
TOTAL PROTEIN: 8.3 g/dL — AB (ref 6.5–8.1)
Total Bilirubin: 0.7 mg/dL (ref 0.3–1.2)

## 2017-01-21 LAB — LIPASE, BLOOD: Lipase: 40 U/L (ref 11–51)

## 2017-01-21 LAB — I-STAT TROPONIN, ED: Troponin i, poc: 0 ng/mL (ref 0.00–0.08)

## 2017-01-21 LAB — D-DIMER, QUANTITATIVE (NOT AT ARMC): D DIMER QUANT: 0.42 ug{FEU}/mL (ref 0.00–0.50)

## 2017-01-21 MED ORDER — HYDROCODONE-ACETAMINOPHEN 5-325 MG PO TABS: 1.0000 | ORAL_TABLET | Freq: Four times a day (QID) | ORAL | 0 refills | Status: DC | PRN

## 2017-01-21 MED ORDER — ACETAMINOPHEN 325 MG PO TABS
650.0000 mg | ORAL_TABLET | Freq: Once | ORAL | Status: AC
  Administered 2017-01-21: 650 mg via ORAL
  Filled 2017-01-21: qty 2

## 2017-01-21 MED ORDER — IBUPROFEN 400 MG PO TABS
600.0000 mg | ORAL_TABLET | Freq: Once | ORAL | Status: AC
  Administered 2017-01-21: 600 mg via ORAL
  Filled 2017-01-21: qty 1

## 2017-01-21 NOTE — ED Provider Notes (Signed)
[facility] DEPT Provider Note   CSN: [id number] Arrival date & time: 01/21/17  0257     History   Chief Complaint Chief Complaint  Patient presents with  . Chest Pain    HPI Nathan Howard is a 76 y.o. male.  The history is provided by the patient.  Chest Pain   This is a new problem. The current episode started yesterday. The problem has been gradually worsening. The pain is associated with breathing. The pain is present in the substernal region. The pain is moderate. The quality of the pain is described as sharp. The pain does not radiate. Associated symptoms include shortness of breath. Pertinent negatives include no abdominal pain, no cough, no diaphoresis, no fever, no lower extremity edema, no vomiting and no weakness.  Pertinent negatives for past medical history include no CAD, no DVT and no PE.  Patient reports onset of CP yesterday He reports it is worse with deep breathing No fever/vomiting No significant cough He has never had this before He is a nonsmoker  Past Medical History:  Diagnosis Date  . Anal fissure   . Anemia   . Arthritis   . Colitis 2011   Dr Sharlett Iles  . Diverticulosis of colon (without mention of hemorrhage)   . Esophageal reflux   . Hemorrhoids   . Hyperlipemia   . Hypertension   . Personal history of colonic polyps 2000   adenomatous polyp Dr Velora Heckler  . Pneumonia   . Pneumonia     X 2  , as child  & 1974  . Renal insufficiency   . Skin cancer    right ear  . Type II or unspecified type diabetes mellitus without mention of complication, not stated as uncontrolled   . Vitamin B12 deficiency     Patient Active Problem List   Diagnosis Date Noted  . Basal cell carcinoma 11/29/2015  . Elevated TSH 11/11/2015  . Tongue lesion 05/16/2015  . Pulmonary fibrosis ([place]) 04/05/2013  . Renal insufficiency 01/02/2011  . ANEMIA, B12 DEFICIENCY 07/02/2010  . CERVICALGIA 04/08/2010  . 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 PERNICIOUS ANEMIA 06/20/2008  . Allergic rhinitis, cause unspecified 06/20/2008  . Hyperlipidemia 05/03/2008  . Essential hypertension 05/03/2008  . GERD 05/03/2008  . Diverticulosis of large intestine 05/03/2008  . 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 COLONIC POLYPS, HX OF 05/03/2008    Past Surgical History:  Procedure Laterality Date  . APPENDECTOMY  1953  . arm surgery     benign growth removed left arm  . CATARACT EXTRACTION     OD  . COLONOSCOPY W/ POLYPECTOMY     Dr Sharlett Iles  . [place]   x 2  . SKIN BIOPSY Right 2014   benign  . TONSILLECTOMY  1961  . UPPER GASTROINTESTINAL ENDOSCOPY     gastritis; Dr Sharlett Iles       Home Medications    Prior to Admission medications   Medication Sig Start Date End Date Taking? Authorizing Provider  acetaminophen (TYLENOL) 500 MG tablet Take 500 mg by mouth every 6 (six) hours as needed.      Historical Provider, MD  aspirin 81 MG tablet Take 81 mg by mouth daily.      Historical Provider, MD  atenolol (TENORMIN) 25 MG tablet TAKE 1/2 TABLET BY MOUTH EVERY MORNING. 11/14/15   Hendricks Limes, MD  Chlorpheniramine-Acetaminophen (CORICIDIN HBP COLD/FLU PO) Per bottle directions  as needed    Historical Provider, MD  famotidine (PEPCID) 20 MG tablet Take 20 mg by mouth 2 (two) times daily.    Historical Provider, MD  fenofibrate (TRICOR) 145 MG tablet TAKE ONE TABLET BY MOUTH ONCE DAILY. 11/07/16   Binnie Rail, MD  fexofenadine (ALLEGRA) 180 MG tablet Take 180 mg by mouth daily as needed.     Historical Provider, MD  glimepiride (AMARYL) 2 MG tablet TAKE 1/2 TABLET BY MOUTH ONCE DAILY FOR DIABETES. 11/14/15   Hendricks Limes, MD  losartan (COZAAR) 100 MG tablet Take 1 tablet (100 mg total) by mouth daily. 11/22/16   Binnie Rail, MD  Multiple Vitamins-Minerals (MULTI COMPLETE PO) Take by mouth daily.    Historical Provider, MD  omeprazole (PRILOSEC) 20  MG capsule Take 1 capsule (20 mg total) by mouth daily. 12/26/16   Tanda Rockers, MD    Family History Family History  Problem Relation Age of Onset  . Heart attack Father 3  . Alcohol abuse Father   . Diabetes Mother   . Heart attack Mother 62  . Dementia Mother   . Stomach cancer Maternal Uncle   . Cancer Maternal Aunt     ? primary  . Cancer Paternal Aunt     ? primary  . Cancer Paternal Uncle     X2 ; prostate& colon  . Heart disease Maternal Grandfather     ? MI  . Arthritis Paternal Grandfather   . Lung cancer Maternal Grandmother     smoked  . Stroke Neg Hx     Social History Social History  Substance Use Topics  . Smoking status: Former Smoker    Packs/day: 1.00    Years: 3.00    Types: Cigarettes    Quit date: 12/22/1961  . Smokeless tobacco: Never Used  . Alcohol use No     Allergies   Patient has no known allergies.   Review of Systems Review of Systems  Constitutional: Negative for diaphoresis and fever.  Respiratory: Positive for shortness of breath. Negative for cough.   Cardiovascular: Positive for chest pain. Negative for leg swelling.  Gastrointestinal: Negative for abdominal pain and vomiting.  Neurological: Negative for weakness.  All other systems reviewed and are negative.    Physical Exam Updated Vital Signs BP 142/70 (BP Location: Left Arm)   Pulse 102   Temp 98.2 F (36.8 C) (Oral)   Resp 20   Ht 5' 10.5" (1.791 m)   Wt 93 kg   SpO2 95%   BMI 29.00 kg/m   Physical Exam  CONSTITUTIONAL: Well developed/well nourished HEAD: Normocephalic/atraumatic EYES: EOMI/PERRL ENMT: Mucous membranes moist NECK: supple no meningeal signs SPINE/BACK:entire spine nontender CV: S1/S2 noted, no murmurs/rubs/gallops noted LUNGS: fine crackles noted bilaterally no apparent distress ABDOMEN: soft, nontender, no rebound or guarding, bowel sounds noted throughout abdomen GU:no cva tenderness NEURO: Pt is awake/alert/appropriate, moves all  extremitiesx4.  No facial droop.   EXTREMITIES: pulses normal/equal, full ROM, no LE edema or tenderness SKIN: warm, color normal PSYCH: no abnormalities of mood noted, alert and oriented to situation  ED Treatments / Results  Labs (all labs ordered are listed, but only abnormal results are displayed) Labs Reviewed  BASIC METABOLIC PANEL - Abnormal; Notable for the following:       Result Value   Sodium 133 (*)    Chloride 99 (*)    Glucose, Bld 159 (*)    Creatinine, Ser 1.37 (*)    GFR  calc non Af Amer 48 (*)    GFR calc Af Amer 56 (*)    All other components within normal limits  HEPATIC FUNCTION PANEL - Abnormal; Notable for the following:    Total Protein 8.3 (*)    Alkaline Phosphatase 36 (*)    All other components within normal limits  CBC  LIPASE, BLOOD  D-DIMER, QUANTITATIVE (NOT AT [facility])  Randolm Idol, ED    EKG  EKG Interpretation  Date/Time:  Wednesday January 21 2017 03:03:32 EST Ventricular Rate:  104 PR Interval:  170 QRS Duration: 76 QT Interval:  344 QTC Calculation: 452 R Axis:   66 Text Interpretation:  Sinus tachycardia Otherwise normal ECG No previous ECGs available Confirmed by Christy Gentles  MD, Veronnica Hennings ([id number]) on 01/21/2017 3:16:05 AM       Radiology Dg Chest 2 View  Result Date: 01/21/2017 CLINICAL DATA:  Mid chest pain. EXAM: CHEST  2 VIEW COMPARISON:  Radiographs 12/26/2016 FINDINGS: Normal heart size and mediastinal contours. Multifocal interstitial coarsening with possible fibrosis, peripheral prominence, unchanged from prior exam. No superimposed consolidation, pulmonary edema, pleural fluid or pneumothorax. Unchanged elevation of right hemidiaphragm. No acute osseous abnormalities are seen. IMPRESSION: Chronic interstitial lung disease, stable in appearance. No acute findings. Electronically Signed   By: Jeb Levering M.D.   On: 01/21/2017 03:30    Procedures Procedures (including critical care time)  Medications Ordered in  ED Medications  ibuprofen (ADVIL,MOTRIN) tablet 600 mg (not administered)  acetaminophen (TYLENOL) tablet 650 mg (650 mg Oral Given 01/21/17 0446)     Initial Impression / Assessment and Plan / ED Course  I have reviewed the triage vital signs and the nursing notes.  Pertinent labs & imaging results that were available during my care of the patient were reviewed by me and considered in my medical decision making (see chart for details). Narcotic database reviewed and considered in decision making     4:31 AM Pt stable Well appearing Pt with pleuritic CP since yesterday I doubt ACS PE workup initiated 6:01 AM Pt stable Workup unremarkable D-dimer negative CXR negative On my assessment, no hypoxia is noted He is in no distress I could not reproduce pain on palpation but he does appear to have pain with movement of arms/upper body I feel he is appropriate for d/c home Advised short course of NSAIDs but also short course of vicodin Final Clinical Impressions(s) / ED Diagnoses   Final diagnoses:  Pleurisy    New Prescriptions New Prescriptions   HYDROCODONE-ACETAMINOPHEN (NORCO/VICODIN) 5-325 MG TABLET    Take 1 tablet by mouth every 6 (six) hours as needed for severe pain.     Ripley Fraise, MD 01/21/17 [phone]

## 2017-01-21 NOTE — ED Triage Notes (Signed)
Pt states he started having central chest pain on yesterday; pt denies cardiac hx; pt states some SOB when he takes deep breath; pt a&ox 4 on arrival; Pt states pain at 8/10 on arrival; No acute distress noted at triage

## 2017-02-03 ENCOUNTER — Other Ambulatory Visit: Payer: Self-pay | Admitting: Internal Medicine

## 2017-03-31 ENCOUNTER — Ambulatory Visit: Payer: Medicare Other | Admitting: Internal Medicine

## 2017-03-31 NOTE — Progress Notes (Deleted)
Subjective:    Patient ID: Nathan Howard, male    DOB: 1941/09/16, 76 y.o.   MRN: [id number]  HPI The patient is here for follow up.  Diabetes: He is taking his medication daily as prescribed. He is compliant with a diabetic diet. He is exercising regularly. He monitors his sugars and they have been running XXX. He checks his feet daily and denies foot lesions. He is up-to-date with an ophthalmology examination.   Hypertension: He is taking his medication daily. He is compliant with a low sodium diet.  He denies chest pain, palpitations, edema, shortness of breath and regular headaches. He is exercising regularly.  He does not monitor his blood pressure at home.    Hyperlipidemia: He is taking his medication daily. He is compliant with a low fat/cholesterol diet. He is exercising regularly. He denies myalgias.   GERD:  He is taking his medication daily as prescribed.  He denies any GERD symptoms and feels his GERD is well controlled.   B12 def:    Medications and allergies reviewed with patient and updated if appropriate.  Patient Active Problem List   Diagnosis Date Noted  . Basal cell carcinoma 11/29/2015  . Elevated TSH 11/11/2015  . Tongue lesion 05/16/2015  . Pulmonary fibrosis ([place]) 04/05/2013  . Renal insufficiency 01/02/2011  . ANEMIA, B12 DEFICIENCY 07/02/2010  . CERVICALGIA 04/08/2010  . Type 2 diabetes, uncontrolled, with renal manifestation ([place]) 09/15/2008  . PERNICIOUS ANEMIA 06/20/2008  . Allergic rhinitis, cause unspecified 06/20/2008  . Hyperlipidemia 05/03/2008  . Essential hypertension 05/03/2008  . GERD 05/03/2008  . Diverticulosis of large intestine 05/03/2008  . 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 COLONIC POLYPS, HX OF 05/03/2008    Current Outpatient Prescriptions on File Prior to Visit  Medication Sig Dispense Refill  . aspirin 81 MG tablet Take 81 mg by mouth daily.      Marland Kitchen atenolol (TENORMIN) 25 MG tablet TAKE 1/2  TABLET BY MOUTH EVERY MORNING. 45 tablet 1  . famotidine (PEPCID) 20 MG tablet Take 20 mg by mouth 2 (two) times daily.    . fenofibrate (TRICOR) 145 MG tablet TAKE ONE TABLET BY MOUTH ONCE DAILY. 90 tablet 3  . glimepiride (AMARYL) 2 MG tablet TAKE 1/2 TABLET BY MOUTH ONCE DAILY FOR DIABETES. 45 tablet 1  . HYDROcodone-acetaminophen (NORCO/VICODIN) 5-325 MG tablet Take 1 tablet by mouth every 6 (six) hours as needed for severe pain. 5 tablet 0  . losartan (COZAAR) 100 MG tablet TAKE ONE TABLET BY MOUTH ONCE DAILY. 90 tablet 0  . omeprazole (PRILOSEC) 20 MG capsule Take 1 capsule (20 mg total) by mouth daily. 30 capsule 6   No current facility-administered medications on file prior to visit.     Past Medical History:  Diagnosis Date  . Anal fissure   . Anemia   . Arthritis   . Colitis 2011   Dr Sharlett Iles  . Diverticulosis of colon (without mention of hemorrhage)   . Esophageal reflux   . Hemorrhoids   . Hyperlipemia   . Hypertension   . Personal history of colonic polyps 2000   adenomatous polyp Dr Velora Heckler  . Pneumonia   . Pneumonia     X 2  , as child  & 1974  . Renal insufficiency   . Skin cancer    right ear  . Type II or unspecified type diabetes mellitus without mention of complication, not stated as uncontrolled   . Vitamin  B12 deficiency     Past Surgical History:  Procedure Laterality Date  . APPENDECTOMY  1953  . arm surgery     benign growth removed left arm  . CATARACT EXTRACTION     OD  . COLONOSCOPY W/ POLYPECTOMY     Dr Sharlett Iles  . [place]   x 2  . SKIN BIOPSY Right 2014   benign  . TONSILLECTOMY  1961  . UPPER GASTROINTESTINAL ENDOSCOPY     gastritis; Dr Sharlett Iles    Social History   Social History  . Marital status: Married    Spouse name: N/A  . Number of children: 0  . Years of education: N/A   Occupational History  . retired [employer] logistics/support/administrative officer   Social History Main Topics  . Smoking status: Former Smoker     Packs/day: 1.00    Years: 3.00    Types: Cigarettes    Quit date: 12/22/1961  . Smokeless tobacco: Never Used  . Alcohol use No  . Drug use: No  . Sexual activity: Not on file   Other Topics Concern  . Not on file   Social History Narrative  . No narrative on file    Family History  Problem Relation Age of Onset  . Heart attack Father 17  . Alcohol abuse Father   . Diabetes Mother   . Heart attack Mother 40  . Dementia Mother   . Stomach cancer Maternal Uncle   . Cancer Maternal Aunt     ? primary  . Cancer Paternal Aunt     ? primary  . Cancer Paternal Uncle     X2 ; prostate& colon  . Heart disease Maternal Grandfather     ? MI  . Arthritis Paternal Grandfather   . Lung cancer Maternal Grandmother     smoked  . Stroke Neg Hx     Review of Systems     Objective:  There were no vitals filed for this visit. Wt Readings from Last 3 Encounters:  01/21/17 205 lb (93 kg)  12/26/16 203 lb 6.4 oz (92.3 kg)  09/30/16 206 lb (93.4 kg)   There is no height or weight on file to calculate BMI.   Physical Exam    Constitutional: Appears well-developed and well-nourished. No distress.  HENT:  Head: Normocephalic and atraumatic.  Neck: Neck supple. No tracheal deviation present. No thyromegaly present.  No cervical lymphadenopathy Cardiovascular: Normal rate, regular rhythm and normal heart sounds.   No murmur heard. No carotid bruit .  No edema Pulmonary/Chest: Effort normal and breath sounds normal. No respiratory distress. No has no wheezes. No rales.  Skin: Skin is warm and dry. Not diaphoretic.  Psychiatric: Normal mood and affect. Behavior is normal.      Assessment & Plan:    See Problem List for Assessment and Plan of chronic medical problems.

## 2017-04-30 ENCOUNTER — Other Ambulatory Visit: Payer: Self-pay | Admitting: Internal Medicine

## 2017-05-05 ENCOUNTER — Other Ambulatory Visit: Payer: Self-pay | Admitting: Internal Medicine

## 2017-06-26 ENCOUNTER — Other Ambulatory Visit (INDEPENDENT_AMBULATORY_CARE_PROVIDER_SITE_OTHER): Payer: Medicare Other

## 2017-06-26 ENCOUNTER — Telehealth: Payer: Self-pay | Admitting: Internal Medicine

## 2017-06-26 ENCOUNTER — Ambulatory Visit (INDEPENDENT_AMBULATORY_CARE_PROVIDER_SITE_OTHER): Payer: Medicare Other | Admitting: Internal Medicine

## 2017-06-26 ENCOUNTER — Encounter: Payer: Self-pay | Admitting: Internal Medicine

## 2017-06-26 DIAGNOSIS — E1122 Type 2 diabetes mellitus with diabetic chronic kidney disease: Secondary | ICD-10-CM | POA: Diagnosis not present

## 2017-06-26 DIAGNOSIS — J8410 Pulmonary fibrosis, unspecified: Secondary | ICD-10-CM

## 2017-06-26 DIAGNOSIS — E1165 Type 2 diabetes mellitus with hyperglycemia: Secondary | ICD-10-CM

## 2017-06-26 DIAGNOSIS — I10 Essential (primary) hypertension: Secondary | ICD-10-CM | POA: Diagnosis not present

## 2017-06-26 LAB — PULMONARY FUNCTION TEST
DL/VA % pred: 93 %
DL/VA: 4.32 ml/min/mmHg/L
DLCO cor % pred: 59 %
DLCO cor: 19.78 ml/min/mmHg
DLCO unc % pred: 62 %
DLCO unc: 20.62 ml/min/mmHg
FEF 25-75 POST: 4.51 L/s
FEF 25-75 Pre: 3.8 L/sec
FEF2575-%Change-Post: 18 %
FEF2575-%PRED-POST: 214 %
FEF2575-%Pred-Pre: 180 %
FEV1-%CHANGE-POST: 3 %
FEV1-%Pred-Post: 101 %
FEV1-%Pred-Pre: 97 %
FEV1-Post: 3.03 L
FEV1-Pre: 2.93 L
FEV1FVC-%Change-Post: 5 %
FEV1FVC-%PRED-PRE: 118 %
FEV6-%Change-Post: 0 %
FEV6-%Pred-Post: 86 %
FEV6-%Pred-Pre: 86 %
FEV6-POST: 3.36 L
FEV6-PRE: 3.4 L
FEV6FVC-%Change-Post: 0 %
FEV6FVC-%PRED-POST: 106 %
FEV6FVC-%PRED-PRE: 106 %
FVC-%CHANGE-POST: -1 %
FVC-%PRED-POST: 80 %
FVC-%PRED-PRE: 82 %
FVC-POST: 3.37 L
FVC-PRE: 3.43 L
PRE FEV6/FVC RATIO: 100 %
Post FEV1/FVC ratio: 90 %
Post FEV6/FVC ratio: 100 %
Pre FEV1/FVC ratio: 86 %
RV % PRED: 55 %
RV: 1.48 L
TLC % pred: 67 %
TLC: 4.82 L

## 2017-06-26 LAB — BASIC METABOLIC PANEL
BUN: 21 mg/dL (ref 6–23)
CHLORIDE: 101 meq/L (ref 96–112)
CO2: 28 meq/L (ref 19–32)
CREATININE: 1.55 mg/dL — AB (ref 0.40–1.50)
Calcium: 10.1 mg/dL (ref 8.4–10.5)
GFR: 46.28 mL/min — ABNORMAL LOW (ref >60.00)
GLUCOSE: 247 mg/dL — AB (ref 70–99)
Potassium: 4.5 mEq/L (ref 3.5–5.1)
Sodium: 135 mEq/L (ref 135–145)

## 2017-06-26 LAB — SEDIMENTATION RATE: Sed Rate: 31 mm/hr — ABNORMAL HIGH (ref 0–20)

## 2017-06-26 LAB — HEMOGLOBIN A1C: Hgb A1c MFr Bld: 7.8 % — ABNORMAL HIGH (ref 4.6–6.5)

## 2017-06-26 NOTE — Telephone Encounter (Signed)
Ok with me 

## 2017-06-26 NOTE — Telephone Encounter (Signed)
Patient is requesting to transfer to Dr.Jones. Dr. Gustavus Bryant told him they would be a good fit.   Dr. Quay Burow is that ok with you? Dr. Ronnald Ramp is that ok with you?   Thank you both!

## 2017-06-26 NOTE — Patient Instructions (Addendum)
Please remember to go to the lab department downstairs in the basement  for your tests - we will call you with the results when they are available.      No change in your medications   Please schedule a follow up visit in 6 months but call sooner if needed with 6 minute walk on return

## 2017-06-26 NOTE — Progress Notes (Signed)
PFT done today. 

## 2017-06-26 NOTE — Progress Notes (Signed)
Subjective:   Patient ID: Nathan Howard, male    DOB: 01-29-1941,   MRN: [id number]    Brief patient profile:  73 yowm quit smoking 1960s retired Company secretary apparently newly dx with PF 02/2013 by cxr (no cxr on file prior to that)   and noted much more difficulty with doe shoveling snow winter of 2016 so referred to pulmonary clinic by Dr Linna Darner 02/26/2015    History of Present Illness  02/26/2015 1st [facility] office visit/ Idabelle Mcpeters   Chief Complaint  Patient presents with  . Pulmonary Consult    Referred by Dr. Unice Cobble. Pt states having SOB since Dec 2015 after developing URI. He gets SOB with doing yard work and with singing. He states that he also has some PND and occ cough with white to clear sputum.   abruptly ill week before xmas, sense of acute sinus congestion, sore throat chest burning during severe coughing fits assoc with infected tooth rx with clindamycin x 40 days and tooth better, all symptoms improved except now doe x flight of steps or yardwork or fast walking. rec Change prilosec to omeprazole 40 mg Take 30-60 min before first meal of the day and Pepcid ac 20 at bedtime until return GERD diet   12/27/2015  f/u ov/Carvel Huskins re: PF/ no change ex tol Chief Complaint  Patient presents with  . Follow-up    CXR done today. Breathing is unchanged. No new co's today.   rec No change rx = ppi qam/ h2hs    06/25/2016  f/u ov/Neliah Cuyler re: PF/ maint on prilosec but worried about press so stopped pepcid Chief Complaint  Patient presents with  . Follow-up    PFT done today. Breathing is overall doing well.    able to work in the yard ok, some trouble bending over at waist, no limiting doe rec Continue prilosec otc 20mg   Take 30-60 min before first meal of the day and Pepcid ac (famotidine) 20 mg one @  bedtime       12/26/2016  f/u ov/Laasia Arcos re: PF/ no longer on PPI due to concern with B12 absorption Chief Complaint  Patient presents with  . Follow-up    Breathing has been  worse with colder weather. He is not coughing much, but does have some PND.    now on pepcid 20 mg bid and worse since stopped ppi though no clear cause and effect Has sense of pnds but no excess/ purulent sputum or mucus plugs   rec Resume  prilosec otc 20mg   Take 30-60 min before first meal of the day and Pepcid ac (famotidine) 20 mg one @  bedtime until return       06/26/2017  f/u ov/Audyn Dimercurio re: PF / omeprazole 20 mg / pepcid   - also requesting labs for Primary care as plans to change to [facility] but not yet established  Chief Complaint  Patient presents with  . Follow-up    PFT's done today. Breathing is progressively worse.   no regular walking  Any more  Doe = MMRC1 = can walk nl pace, flat grade, can't hurry or go uphills or steps s sob    No obvious day to day or daytime variability or assoc excess/ purulent sputum or mucus plugs or hemoptysis or cp or chest tightness, subjective wheeze or overt sinus or hb symptoms. No unusual exp hx or h/o childhood pna/ asthma or knowledge of premature birth.  Sleeping ok without nocturnal  or early am  exacerbation  of respiratory  c/o's or need for noct saba. Also denies any obvious fluctuation of symptoms with weather or environmental changes or other aggravating or alleviating factors except as outlined above   Current Medications, Allergies, Complete Past Medical History, Past Surgical History, Family History, and Social History were reviewed in [employer] record.  ROS  The following are not active complaints unless bolded sore throat, dysphagia, dental problems, itching, sneezing,  nasal congestion or excess/ purulent secretions, ear ache,   fever, chills, sweats, unintended wt loss, classically pleuritic or exertional cp,  orthopnea pnd or leg swelling, presyncope, palpitations, abdominal pain, anorexia, nausea, vomiting, diarrhea  or change in bowel or bladder habits, change in stools or urine, dysuria,hematuria,   rash, arthralgias, visual complaints, headache, numbness, weakness or ataxia or problems with walking or coordination,  change in mood/affect or memory.                    Objective:   Physical Exam  amb wm nad  05/16/2015        209 > 06/28/2015    213  > 12/27/2015   211 > 06/25/2016  206 > 12/26/2016  203> 06/26/2017  211     02/26/15 213 lb (96.616 kg)  02/12/15 213 lb 12 oz (96.956 kg)  04/11/14 216 lb 6.4 oz (98.158 kg)    Vital signs reviewed - Note on arrival 02 sats  95 % on RA     HEENT: nl dentition, turbinates, and orophanx. Nl external ear canals without cough reflex    NECK :  without JVD/Nodes/TM/ nl carotid upstrokes bilaterally   LUNGS: no acc muscle use,  Very minimal bilateral insp crackles  -with cough on deep Inspiration    CV:  RRR  no s3 or murmur or increase in P2, no edema   ABD:  soft and nontender with nl excursion in the supine position. No bruits or organomegaly, bowel sounds nl  MS:  warm without deformities, calf tenderness, cyanosis or clubbing  SKIN: warm and dry without lesions    NEURO:  alert, approp, no deficits        Labs ordered/ reviewed:      Chemistry      Component Value Date/Time   NA 135 06/26/2017 1353   K 4.5 06/26/2017 1353   CL 101 06/26/2017 1353   CO2 28 06/26/2017 1353   BUN 21 06/26/2017 1353   CREATININE 1.55 (H) 06/26/2017 1353   CREATININE 1.73 (H) 02/18/2013 1701      Component Value Date/Time   CALCIUM 10.1 06/26/2017 1353   ALKPHOS 36 (L) 01/21/2017 0427   AST 33 01/21/2017 0427   ALT 22 01/21/2017 0427   BILITOT 0.7 01/21/2017 0427                Lab Results  Component Value Date   ESRSEDRATE 31 (H) 06/26/2017   ESRSEDRATE 23 (H) 12/26/2016   ESRSEDRATE 28 (H) 02/26/2015          Assessment & Plan:

## 2017-06-27 NOTE — Assessment & Plan Note (Signed)
Adequate control on present rx, reviewed in detail with pt > no change in rx needed   

## 2017-06-27 NOTE — Assessment & Plan Note (Signed)
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 -Smoking history encompassed less than 5 years. He's had no significant occupational exposures - 02/26/2015  Walked RA x 3 laps @ 185 ft each stopped due to  End of study , nl pace, no desats - 02/26/2015 collagen vasc screen sent > neg with esr 28  - 02/26/2015 rec max gerd rx/ diet and > improved 05/16/15  - 06/28/2015  VC 3.65 (85%) no obst and dlco 56 corrects  to 81  - 06/28/2015  Walked RA x 3 laps @ 185 ft each stopped due to End of study, nl pace, no sob or desat  - PFT's  06/25/2016   VC  3.85  (91%)  DLCO  53/51 % corrects to 72 % for alv volume  - 12/26/2016  Walked RA x 3 laps @ 185 ft each stopped due to  End of study, nl pace, no sob or desat  - PFTs  06/26/2017   VC  3.33 (79%)   DLCO  62/59  %  Corrects to 93%  His dlco is stable x 2 years though he is losing a little lung volume ? Related to wt gain / mild kyphosis but no evidence of progressive ild here > key is to maintain more regular activity and monitor sats with exertion and let me know if noting any drop which should not happen with dlco > 60   Also discussed more aggressive w/u but pt declined at this point   Discussed in detail all the  indications, usual  risks and alternatives  relative to the benefits with patient who agrees to proceed with conservative f/u as outlined   I had an extended discussion with the patient reviewing all relevant studies completed to date and  lasting 15 to 20 minutes of a 25 minute visit    Each maintenance medication was reviewed in detail including most importantly the difference between maintenance and prns and under what circumstances the prns are to be triggered using an action plan format that is not reflected in the computer generated alphabetically organized AVS.    Please see AVS for specific instructions unique to this visit that I personally wrote and verbalized to the the pt in detail and then reviewed with pt  by my nurse  highlighting any  changes in therapy recommended at today's visit to their plan of care.

## 2017-06-27 NOTE — Assessment & Plan Note (Signed)
Lab Results  Component Value Date   HGBA1C 7.8 (H) 06/26/2017   HGBA1C 7.1 (H) 11/05/2016   HGBA1C 8.0 (H) 03/31/2016     Pt requested referral to our first floor for  PCP/  f/u but advised may be a wait - in meantime no change in dm meds needed

## 2017-07-01 NOTE — Telephone Encounter (Signed)
Yes, I will see him.

## 2017-07-09 NOTE — Telephone Encounter (Signed)
Patient has an appointment July 23 at 2pm with Dr. Ronnald Ramp.

## 2017-07-13 ENCOUNTER — Encounter: Payer: Self-pay | Admitting: Internal Medicine

## 2017-07-13 ENCOUNTER — Ambulatory Visit (INDEPENDENT_AMBULATORY_CARE_PROVIDER_SITE_OTHER): Payer: Medicare Other | Admitting: Internal Medicine

## 2017-07-13 VITALS — BP 140/80 | HR 81 | Temp 98.6°F | Resp 14 | Ht 70.5 in | Wt 211.0 lb

## 2017-07-13 DIAGNOSIS — I10 Essential (primary) hypertension: Secondary | ICD-10-CM

## 2017-07-13 DIAGNOSIS — E1165 Type 2 diabetes mellitus with hyperglycemia: Secondary | ICD-10-CM

## 2017-07-13 DIAGNOSIS — E1121 Type 2 diabetes mellitus with diabetic nephropathy: Secondary | ICD-10-CM | POA: Diagnosis not present

## 2017-07-13 DIAGNOSIS — R0609 Other forms of dyspnea: Secondary | ICD-10-CM

## 2017-07-13 DIAGNOSIS — IMO0002 Reserved for concepts with insufficient information to code with codable children: Secondary | ICD-10-CM

## 2017-07-13 MED ORDER — FREESTYLE LIBRE SENSOR SYSTEM MISC: 1.0000 | Freq: Two times a day (BID) | 1 refills | Status: DC

## 2017-07-13 MED ORDER — FREESTYLE LIBRE READER DEVI: 1.0000 | Freq: Two times a day (BID) | 1 refills | Status: DC

## 2017-07-13 MED ORDER — ZOSTER VAC RECOMB ADJUVANTED 50 MCG/0.5ML IM SUSR: 0.5000 mL | Freq: Once | INTRAMUSCULAR | 1 refills | Status: AC

## 2017-07-13 NOTE — Progress Notes (Signed)
Subjective:  Patient ID: Nathan Howard, male    DOB: 1941/08/13  Age: 75 y.o. MRN: [id number]  CC: Hypertension and Diabetes  New to me  HPI Nathan Howard presents for establishing as a new patient. He recently had an A1c that was 7.8%. He complains of chronic DOE. He was seen in the ED about this 7 months ago and ruled out for ischemia. He is followed by pulmonary. He denies chest pain, palpitations, edema, or fatigue.  History Nathan Howard has a past medical history of Anal fissure; Anemia; Arthritis; Colitis (2011); Diverticulosis of colon (without mention of hemorrhage); Esophageal reflux; Hemorrhoids; Hyperlipemia; Hypertension; Personal history of colonic polyps (2000); Pneumonia; Pneumonia; Renal insufficiency; Skin cancer; Type II or unspecified type diabetes mellitus without mention of complication, not stated as uncontrolled; and Vitamin B12 deficiency.   He has a past surgical history that includes Appendectomy (7867); Cataract extraction; Hemorroidectomy (1960, 1975); Tonsillectomy (1961); arm surgery; Colonoscopy w/ polypectomy; Upper gastrointestinal endoscopy; and Skin biopsy (Right, 2014).   His family history includes Alcohol abuse in his father; Arthritis in his paternal grandfather; Cancer in his maternal aunt, paternal aunt, and paternal uncle; Dementia in his mother; Diabetes in his mother; Heart attack (age of onset: 39) in his father; Heart attack (age of onset: 19) in his mother; Heart disease in his maternal grandfather; Lung cancer in his maternal grandmother; Stomach cancer in his maternal uncle.He reports that he quit smoking about 55 years ago. His smoking use included Cigarettes. He has a 3.00 pack-year smoking history. He has never used smokeless tobacco. He reports that he does not drink alcohol or use drugs.  Outpatient Medications Prior to Visit  Medication Sig Dispense Refill  . aspirin 81 MG tablet Take 81 mg by mouth daily.      Marland Kitchen atenolol (TENORMIN) 25  MG tablet TAKE 1/2 TABLET BY MOUTH EVERY MORNING. 45 tablet 1  . famotidine (PEPCID) 20 MG tablet Take 20 mg by mouth 2 (two) times daily.    . fenofibrate (TRICOR) 145 MG tablet TAKE ONE TABLET BY MOUTH ONCE DAILY. 90 tablet 3  . glimepiride (AMARYL) 2 MG tablet TAKE 1/2 TABLET BY MOUTH ONCE DAILY FOR DIABETES. 45 tablet 1  . losartan (COZAAR) 100 MG tablet TAKE ONE TABLET BY MOUTH ONCE DAILY. 90 tablet 0  . omeprazole (PRILOSEC) 20 MG capsule TAKE ONE CAPSULE BY MOUTH DAILY. 90 capsule 0   No facility-administered medications prior to visit.     ROS Review of Systems  Constitutional: Negative for appetite change, diaphoresis, fatigue and unexpected weight change.  HENT: Negative.   Eyes: Negative for photophobia and visual disturbance.  Respiratory: Positive for shortness of breath. Negative for cough, chest tightness, wheezing and stridor.   Cardiovascular: Negative for chest pain, palpitations and leg swelling.  Gastrointestinal: Negative for abdominal pain, constipation, diarrhea, nausea and vomiting.  Endocrine: Negative.   Genitourinary: Negative.  Negative for difficulty urinating.  Musculoskeletal: Negative.   Skin: Negative.   Neurological: Negative.  Negative for dizziness, weakness and light-headedness.  Hematological: Negative for adenopathy. Does not bruise/bleed easily.  Psychiatric/Behavioral: Negative.     Objective:  BP 140/80 (BP Location: Left Arm, Patient Position: Sitting, Cuff Size: Normal)   Pulse 81   Temp 98.6 F (37 C) (Oral)   Resp 14   Ht 5' 10.5" (1.791 m)   Wt 211 lb (95.7 kg)   SpO2 98%   BMI 29.85 kg/m   Physical Exam  Constitutional: He is oriented to person,  place, and time. No distress.  HENT:  Mouth/Throat: Oropharynx is clear and moist. No oropharyngeal exudate.  Eyes: Conjunctivae are normal. Right eye exhibits no discharge. Left eye exhibits no discharge. No scleral icterus.  Neck: Normal range of motion. Neck supple. No JVD present.  No thyromegaly present.  Cardiovascular: Normal rate, regular rhythm and intact distal pulses.  Exam reveals no gallop.   No murmur heard. EKG - Sinus  Rhythm  WITHIN NORMAL LIMITS- no change from prior EKG  Pulmonary/Chest: Effort normal and breath sounds normal. No respiratory distress. He has no wheezes. He has no rales. He exhibits no tenderness.  Abdominal: Soft. Bowel sounds are normal. He exhibits no distension and no mass. There is no tenderness. There is no rebound and no guarding.  Musculoskeletal: Normal range of motion. He exhibits no edema or tenderness.  Lymphadenopathy:    He has no cervical adenopathy.  Neurological: He is alert and oriented to person, place, and time.  Skin: Skin is warm and dry. No rash noted. He is not diaphoretic. No erythema. No pallor.  Vitals reviewed.   Lab Results  Component Value Date   WBC 10.4 01/21/2017   HGB 13.9 01/21/2017   HCT 42.6 01/21/2017   PLT 227 01/21/2017   GLUCOSE 247 (H) 06/26/2017   CHOL 169 11/05/2016   TRIG 181.0 (H) 11/05/2016   HDL 34.00 (L) 11/05/2016   LDLDIRECT 116.0 11/07/2015   LDLCALC 99 11/05/2016   ALT 22 01/21/2017   AST 33 01/21/2017   NA 135 06/26/2017   K 4.5 06/26/2017   CL 101 06/26/2017   CREATININE 1.55 (H) 06/26/2017   BUN 21 06/26/2017   CO2 28 06/26/2017   TSH 3.69 11/05/2016   PSA 1.35 03/24/2011   HGBA1C 7.8 (H) 06/26/2017   MICROALBUR <0.7 11/05/2016    Assessment & Plan:   Nathan Howard was seen today for hypertension and diabetes.  Diagnoses and all orders for this visit:  DOE (dyspnea on exertion)- His DOE is not associated with chest pain or other symptoms consistent with ischemia. His EKG is normal. This makes obesity and poor conditioning the most likely cause. I've encouraged him to be more active. -     EKG 12-Lead  Uncontrolled type 2 diabetes mellitus with diabetic nephropathy, without long-term current use of insulin ([place])- his blood sugars are not quite adequately well  controlled with an A1c of 7.8%. He is not willing to add another medication at this time. He does agree to work on his lifestyle modifications. -     Continuous Blood Gluc Receiver (FREESTYLE LIBRE READER) DEVI; 1 Act by Does not apply route 2 (two) times daily. -     Continuous Blood Gluc Sensor (FREESTYLE LIBRE SENSOR SYSTEM) MISC; 1 Act by Does not apply route 2 (two) times daily.  Essential hypertension- his blood pressure is adequately well controlled. Recent renal function was stable. Electrolytes are normal.  Other orders -     Zoster Vac Recomb Adjuvanted [facility]) injection; Inject 0.5 mLs into the muscle once.   I am having Nathan Howard start on FREESTYLE LIBRE READER, [place], and Zoster Vac Recomb Adjuvanted. I am also having him maintain his aspirin, fenofibrate, famotidine, glimepiride, atenolol, losartan, and omeprazole.  Meds ordered this encounter  Medications  . Continuous Blood Gluc Receiver (FREESTYLE LIBRE READER) DEVI    Sig: 1 Act by Does not apply route 2 (two) times daily.    Dispense:  1 Device    Refill:  1  . Continuous Blood Gluc Sensor (FREESTYLE LIBRE SENSOR SYSTEM) MISC    Sig: 1 Act by Does not apply route 2 (two) times daily.    Dispense:  1 each    Refill:  1  . Zoster Vac Recomb Adjuvanted [facility]) injection    Sig: Inject 0.5 mLs into the muscle once.    Dispense:  1 each    Refill:  1     Follow-up: Return in about 4 months (around 11/13/2017).  Scarlette Calico, MD

## 2017-07-13 NOTE — Patient Instructions (Signed)

## 2017-07-29 ENCOUNTER — Other Ambulatory Visit: Payer: Self-pay | Admitting: Internal Medicine

## 2017-10-29 ENCOUNTER — Other Ambulatory Visit: Payer: Self-pay | Admitting: Internal Medicine

## 2017-11-02 LAB — HM DIABETES EYE EXAM

## 2017-11-13 ENCOUNTER — Encounter: Payer: Self-pay | Admitting: Internal Medicine

## 2017-11-17 ENCOUNTER — Ambulatory Visit: Payer: Medicare Other | Admitting: Internal Medicine

## 2017-11-17 ENCOUNTER — Other Ambulatory Visit (INDEPENDENT_AMBULATORY_CARE_PROVIDER_SITE_OTHER): Payer: Medicare Other

## 2017-11-17 ENCOUNTER — Encounter: Payer: Self-pay | Admitting: Internal Medicine

## 2017-11-17 VITALS — BP 120/60 | HR 75 | Temp 98.8°F | Resp 16 | Ht 70.5 in | Wt 198.0 lb

## 2017-11-17 DIAGNOSIS — I10 Essential (primary) hypertension: Secondary | ICD-10-CM

## 2017-11-17 DIAGNOSIS — E785 Hyperlipidemia, unspecified: Secondary | ICD-10-CM

## 2017-11-17 DIAGNOSIS — E1165 Type 2 diabetes mellitus with hyperglycemia: Secondary | ICD-10-CM | POA: Diagnosis not present

## 2017-11-17 DIAGNOSIS — E1129 Type 2 diabetes mellitus with other diabetic kidney complication: Secondary | ICD-10-CM | POA: Diagnosis not present

## 2017-11-17 DIAGNOSIS — IMO0002 Reserved for concepts with insufficient information to code with codable children: Secondary | ICD-10-CM

## 2017-11-17 DIAGNOSIS — N289 Disorder of kidney and ureter, unspecified: Secondary | ICD-10-CM

## 2017-11-17 DIAGNOSIS — Z23 Encounter for immunization: Secondary | ICD-10-CM | POA: Diagnosis not present

## 2017-11-17 LAB — HEMOGLOBIN A1C: Hgb A1c MFr Bld: 7.5 % — ABNORMAL HIGH (ref 4.6–6.5)

## 2017-11-17 LAB — COMPREHENSIVE METABOLIC PANEL
ALK PHOS: 38 U/L — AB (ref 39–117)
ALT: 9 U/L (ref 0–53)
AST: 18 U/L (ref 0–37)
Albumin: 4.3 g/dL (ref 3.5–5.2)
BILIRUBIN TOTAL: 0.6 mg/dL (ref 0.2–1.2)
BUN: 29 mg/dL — ABNORMAL HIGH (ref 6–23)
CO2: 25 mEq/L (ref 19–32)
Calcium: 10.2 mg/dL (ref 8.4–10.5)
Chloride: 101 mEq/L (ref 96–112)
Creatinine, Ser: 1.7 mg/dL — ABNORMAL HIGH (ref 0.40–1.50)
GFR: 41.55 mL/min — AB (ref >60.00)
GLUCOSE: 128 mg/dL — AB (ref 70–99)
Potassium: 4.3 mEq/L (ref 3.5–5.1)
Sodium: 135 mEq/L (ref 135–145)
TOTAL PROTEIN: 8.1 g/dL (ref 6.0–8.3)

## 2017-11-17 LAB — CBC WITH DIFFERENTIAL/PLATELET
BASOS ABS: 0.1 10*3/uL (ref 0.0–0.1)
Basophils Relative: 1.3 % (ref 0.0–3.0)
EOS ABS: 0.5 10*3/uL (ref 0.0–0.7)
Eosinophils Relative: 7.8 % — ABNORMAL HIGH (ref 0.0–5.0)
HEMATOCRIT: 39.3 % (ref 39.0–52.0)
HEMOGLOBIN: 13.1 g/dL (ref 13.0–17.0)
LYMPHS PCT: 28 % (ref 12.0–46.0)
Lymphs Abs: 1.7 10*3/uL (ref 0.7–4.0)
MCHC: 33.2 g/dL (ref 30.0–36.0)
MCV: 89.5 fl (ref 78.0–100.0)
MONOS PCT: 8.1 % (ref 3.0–12.0)
Monocytes Absolute: 0.5 10*3/uL (ref 0.1–1.0)
Neutro Abs: 3.4 10*3/uL (ref 1.4–7.7)
Neutrophils Relative %: 54.8 % (ref 43.0–77.0)
PLATELETS: 273 10*3/uL (ref 150.0–400.0)
RBC: 4.4 Mil/uL (ref 4.22–5.81)
RDW: 14 % (ref 11.5–15.5)
WBC: 6.2 10*3/uL (ref 4.0–10.5)

## 2017-11-17 LAB — LIPID PANEL
CHOL/HDL RATIO: 6
Cholesterol: 163 mg/dL (ref 0–200)
HDL: 29.3 mg/dL — ABNORMAL LOW (ref >39.00)
NONHDL: 134.04
Triglycerides: 211 mg/dL — ABNORMAL HIGH (ref 0.0–149.0)
VLDL: 42.2 mg/dL — AB (ref 0.0–40.0)

## 2017-11-17 LAB — MICROALBUMIN / CREATININE URINE RATIO
Creatinine,U: 40 mg/dL
MICROALB/CREAT RATIO: 1.7 mg/g (ref 0.0–30.0)
Microalb, Ur: <0.7 mg/dL (ref 0.0–1.9)

## 2017-11-17 LAB — LDL CHOLESTEROL, DIRECT: Direct LDL: 108 mg/dL

## 2017-11-17 NOTE — Progress Notes (Signed)
Subjective:  Patient ID: Nathan Howard, male    DOB: [birth year]  Age: 76 y.o. MRN: [id number]  CC: Hypertension; Hyperlipidemia; and Diabetes   HPI Nathan Howard presents for f/up on hypertension, high cholesterol, and diabetes.  He feels well and offers no complaints.  He tells me his blood pressure and blood sugar have been well controlled.  He is tolerating fenofibrate with no muscle aches, joint aches, or abdominal pain.  Outpatient Medications Prior to Visit  Medication Sig Dispense Refill  . aspirin 81 MG tablet Take 81 mg by mouth daily.      Marland Kitchen atenolol (TENORMIN) 25 MG tablet TAKE 1/2 TABLET BY MOUTH EVERY MORNING. 45 tablet 0  . Chlorpheniramine-Acetaminophen (CORICIDIN HBP COLD/FLU PO) Take by mouth.    . famotidine (PEPCID) 20 MG tablet Take 20 mg by mouth 2 (two) times daily.    . fenofibrate (TRICOR) 145 MG tablet Take 1 tablet (145 mg total) daily by mouth. 90 tablet 0  . glimepiride (AMARYL) 2 MG tablet TAKE 1/2 TABLET BY MOUTH ONCE DAILY FOR DIABETES. 45 tablet 0  . losartan (COZAAR) 100 MG tablet Take 1 tablet (100 mg total) daily by mouth. 90 tablet 0  . Multiple Vitamins-Minerals (VITEYES AREDS ADVANCED PO) Take by mouth.    . naproxen sodium (ALEVE) 220 MG tablet Take 220 mg by mouth.    Marland Kitchen omeprazole (PRILOSEC) 20 MG capsule TAKE ONE CAPSULE BY MOUTH DAILY. 90 capsule 1  . Continuous Blood Gluc Receiver (FREESTYLE LIBRE READER) DEVI 1 Act by Does not apply route 2 (two) times daily. 1 Device 1  . Continuous Blood Gluc Sensor (FREESTYLE LIBRE SENSOR SYSTEM) MISC 1 Act by Does not apply route 2 (two) times daily. 1 each 1   No facility-administered medications prior to visit.     ROS Review of Systems  Constitutional: Negative.  Negative for appetite change, diaphoresis, fatigue and unexpected weight change.  HENT: Negative.   Eyes: Negative for visual disturbance.  Respiratory: Negative for cough, chest tightness, shortness of breath and wheezing.     Cardiovascular: Negative.  Negative for chest pain, palpitations and leg swelling.  Gastrointestinal: Negative for abdominal pain, constipation, diarrhea, nausea and vomiting.  Endocrine: Negative.  Negative for polydipsia, polyphagia and polyuria.  Genitourinary: Negative.  Negative for difficulty urinating, dysuria, hematuria and urgency.  Musculoskeletal: Negative for arthralgias, back pain, myalgias and neck pain.  Skin: Negative.  Negative for color change and rash.  Allergic/Immunologic: Negative.   Neurological: Negative.   Hematological: Negative for adenopathy. Does not bruise/bleed easily.  Psychiatric/Behavioral: Negative.     Objective:  BP 120/60 (BP Location: Left Arm, Patient Position: Sitting, Cuff Size: Normal)   Pulse 75   Temp 98.8 F (37.1 C) (Oral)   Resp 16   Ht 5' 10.5" (1.791 m)   Wt 198 lb (89.8 kg)   SpO2 95%   BMI 28.01 kg/m   BP Readings from Last 3 Encounters:  11/17/17 120/60  07/13/17 140/80  06/26/17 120/66    Wt Readings from Last 3 Encounters:  11/17/17 198 lb (89.8 kg)  07/13/17 211 lb (95.7 kg)  06/26/17 211 lb (95.7 kg)    Physical Exam  Constitutional: He is oriented to person, place, and time. No distress.  HENT:  Mouth/Throat: Oropharynx is clear and moist. No oropharyngeal exudate.  Eyes: Conjunctivae are normal. Right eye exhibits no discharge. Left eye exhibits no discharge. No scleral icterus.  Neck: Normal range of motion. Neck supple. No JVD  present. No thyromegaly present.  Cardiovascular: Normal rate, regular rhythm and intact distal pulses. Exam reveals no gallop and no friction rub.  No murmur heard. Pulmonary/Chest: Effort normal and breath sounds normal. No respiratory distress. He has no wheezes. He has no rales. He exhibits no tenderness.  Abdominal: Soft. Bowel sounds are normal. He exhibits no distension and no mass. There is no tenderness. There is no rebound and no guarding.  Musculoskeletal: Normal range of  motion. He exhibits no edema, tenderness or deformity.  Lymphadenopathy:    He has no cervical adenopathy.  Neurological: He is alert and oriented to person, place, and time.  Skin: Skin is warm. No rash noted. He is not diaphoretic. No erythema. No pallor.  Vitals reviewed.   Lab Results  Component Value Date   WBC 6.2 11/17/2017   HGB 13.1 11/17/2017   HCT 39.3 11/17/2017   PLT 273.0 11/17/2017   GLUCOSE 128 (H) 11/17/2017   CHOL 163 11/17/2017   TRIG 211.0 (H) 11/17/2017   HDL 29.30 (L) 11/17/2017   LDLDIRECT 108.0 11/17/2017   LDLCALC 99 11/05/2016   ALT 9 11/17/2017   AST 18 11/17/2017   NA 135 11/17/2017   K 4.3 11/17/2017   CL 101 11/17/2017   CREATININE 1.70 (H) 11/17/2017   BUN 29 (H) 11/17/2017   CO2 25 11/17/2017   TSH 4.51 (H) 11/17/2017   PSA 1.35 03/24/2011   HGBA1C 7.5 (H) 11/17/2017   MICROALBUR <0.7 11/17/2017    Dg Chest 2 View  Result Date: 01/21/2017 CLINICAL DATA:  Mid chest pain. EXAM: CHEST  2 VIEW COMPARISON:  Radiographs 12/26/2016 FINDINGS: Normal heart size and mediastinal contours. Multifocal interstitial coarsening with possible fibrosis, peripheral prominence, unchanged from prior exam. No superimposed consolidation, pulmonary edema, pleural fluid or pneumothorax. Unchanged elevation of right hemidiaphragm. No acute osseous abnormalities are seen. IMPRESSION: Chronic interstitial lung disease, stable in appearance. No acute findings. Electronically Signed   By: Jeb Levering M.D.   On: 01/21/2017 03:30    Assessment & Plan:   Nathan Howard was seen today for hypertension, hyperlipidemia and diabetes.  Diagnoses and all orders for this visit:  Essential hypertension- His blood pressure is adequately well controlled.  Electrolytes and renal function are normal. -     Comprehensive metabolic panel; Future -     CBC with Differential/Platelet; Future -     Thyroid Panel With TSH; Future  Type 2 diabetes, uncontrolled, with renal manifestation  ([place])- His A1c is down to 7.5%.  His blood sugars are adequately well controlled. -     Hemoglobin A1c; Future -     Microalbumin / creatinine urine ratio; Future -     atorvastatin (LIPITOR) 10 MG tablet; Take 1 tablet (10 mg total) by mouth daily.  Renal insufficiency- His renal function is stable.  Will maintain tight control of blood pressure and blood sugar.  He will avoid nephrotoxic agents. -     Comprehensive metabolic panel; Future -     Urinalysis, Routine w reflex microscopic; Future  Dyslipidemia, goal LDL below 100- He has not quite achieved his LDL goal so I have asked him to start a statin for CV risk reduction. -     Lipid panel; Future -     Thyroid Panel With TSH; Future -     atorvastatin (LIPITOR) 10 MG tablet; Take 1 tablet (10 mg total) by mouth daily.  Need for influenza vaccination -     Flu vaccine HIGH DOSE PF (Fluzone  High dose)  Need for pneumococcal vaccination -     Pneumococcal conjugate vaccine 13-valent  Need for Tdap vaccination -     Tdap vaccine greater than or equal to 7yo IM   I have discontinued Juanda Crumble R. Ozawa's FREESTYLE LIBRE READER and [place]. I am also having him start on atorvastatin. Additionally, I am having him maintain his aspirin, famotidine, atenolol, glimepiride, losartan, fenofibrate, omeprazole, naproxen sodium, Chlorpheniramine-Acetaminophen (CORICIDIN HBP COLD/FLU PO), and Multiple Vitamins-Minerals (VITEYES AREDS ADVANCED PO).  Meds ordered this encounter  Medications  . atorvastatin (LIPITOR) 10 MG tablet    Sig: Take 1 tablet (10 mg total) by mouth daily.    Dispense:  90 tablet    Refill:  1     Follow-up: Return in about 6 months (around 05/17/2018).  Scarlette Calico, MD

## 2017-11-17 NOTE — Patient Instructions (Signed)

## 2017-11-18 ENCOUNTER — Encounter: Payer: Self-pay | Admitting: Internal Medicine

## 2017-11-18 LAB — URINALYSIS, ROUTINE W REFLEX MICROSCOPIC
Bilirubin Urine: NEGATIVE
Hgb urine dipstick: NEGATIVE
Ketones, ur: NEGATIVE
Leukocytes, UA: NEGATIVE
NITRITE: NEGATIVE
RBC / HPF: NONE SEEN (ref 0–?)
SPECIFIC GRAVITY, URINE: 1.01 (ref 1.000–1.030)
Total Protein, Urine: NEGATIVE
URINE GLUCOSE: NEGATIVE
Urobilinogen, UA: 0.2 (ref 0.0–1.0)
WBC UA: NONE SEEN (ref 0–?)
pH: 6.5 (ref 5.0–8.0)

## 2017-11-18 LAB — THYROID PANEL WITH TSH
Free Thyroxine Index: 1.7 (ref 1.4–3.8)
T3 Uptake: 25 % (ref 22–35)
T4, Total: 6.7 ug/dL (ref 4.9–10.5)
TSH: 4.51 m[IU]/L — AB (ref 0.40–4.50)

## 2017-11-19 MED ORDER — ATORVASTATIN CALCIUM 10 MG PO TABS: 10.0000 mg | ORAL_TABLET | Freq: Every day | ORAL | 1 refills | Status: DC

## 2017-12-28 ENCOUNTER — Ambulatory Visit (INDEPENDENT_AMBULATORY_CARE_PROVIDER_SITE_OTHER): Payer: Medicare Other | Admitting: *Deleted

## 2017-12-28 ENCOUNTER — Ambulatory Visit: Payer: Medicare Other | Admitting: Internal Medicine

## 2017-12-28 ENCOUNTER — Encounter: Payer: Self-pay | Admitting: Internal Medicine

## 2017-12-28 VITALS — BP 114/62 | HR 72 | Ht 70.5 in | Wt 189.0 lb

## 2017-12-28 DIAGNOSIS — J8410 Pulmonary fibrosis, unspecified: Secondary | ICD-10-CM

## 2017-12-28 NOTE — Progress Notes (Signed)
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 Pulmonary Consult    Referred by Dr. Unice Cobble. Pt states having SOB since Dec 2015 after developing URI. He gets SOB with doing yard work and with singing. He states that he also has some PND and occ cough with white to clear sputum.   abruptly ill week before xmas, sense of acute sinus congestion, sore throat chest burning during severe coughing fits assoc with infected tooth rx with clindamycin x 40 days and tooth better, all symptoms improved except now doe x flight of steps or yardwork or fast walking. rec Change prilosec to omeprazole 40 mg Take 30-60 min before first meal of the day and Pepcid ac 20 at bedtime until return GERD diet   12/27/2015  f/u ov/Kaidence Callaway re: PF/ no change ex tol Chief Complaint  Patient presents with  . Follow-up    CXR done today. Breathing is unchanged. No new co's today.   rec No change rx = ppi qam/ h2hs    06/25/2016  f/u ov/Amory Zbikowski re: PF/ maint on prilosec but worried about press so stopped pepcid Chief Complaint  Patient presents with  . Follow-up    PFT done today. Breathing is overall doing well.    able to work in the yard ok, some trouble bending over at waist, no limiting doe rec Continue prilosec otc 20mg   Take 30-60 min before first meal of the day and Pepcid ac (famotidine) 20 mg one @  bedtime       12/26/2016  f/u ov/Tannisha Kennington re: PF/ no longer on PPI due to concern with B12 absorption Chief Complaint  Patient presents with  . Follow-up    Breathing has been  worse with colder weather. He is not coughing much, but does have some PND.    now on pepcid 20 mg bid and worse since stopped ppi though no clear cause and effect Has sense of pnds but no excess/ purulent sputum or mucus plugs   rec Resume  prilosec otc 20mg   Take 30-60 min before first meal of the day and Pepcid ac (famotidine) 20 mg one @  bedtime until return       06/26/2017  f/u ov/Oreste Majeed re: PF / omeprazole 20 mg / pepcid   - also requesting labs for Primary care as plans to change to [facility] but not yet established  Chief Complaint  Patient presents with  . Follow-up    PFT's done today. Breathing is progressively worse.   no regular walking  Any more  Doe = MMRC1 = can walk nl pace, flat grade, can't hurry or go uphills or steps s sob   rec No change in your medications  Please schedule a follow up visit in 6 months but call sooner if needed with 6 minute walk on return      12/28/2017  f/u ov/Garison Genova re:  PF  Chief Complaint  Patient presents with  . Follow-up    6MW done today.  He states he had a cold in early Dec 2018, and is now having sinus issues and vertigo.   intermittent dizziness since late dec 2018 esp in am's p stirs, has never tried lying back down to see if resolves  Mild nasal congestion No decline ex tol = doe = MMRC1 = can walk nl pace, flat grade, can't hurry or go uphills or steps s sob   Stopped all acid suppression one week prior to OV  Due to concerns they might have caused dizziness but no change in resp symptoms or dizziness since d/c'd and has pcp eval scheduled for 12/29/17.     No obvious day to day or daytime variability or assoc excess/ purulent sputum or mucus plugs or hemoptysis or cp or chest tightness, subjective wheeze or overt   hb symptoms. No unusual exposure hx or h/o childhood pna/ asthma or knowledge of premature birth.  Sleeping ok flat without nocturnal  or early am exacerbation  of respiratory  c/o's or need for noct saba. Also denies any  obvious fluctuation of symptoms with weather or environmental changes or other aggravating or alleviating factors except as outlined above   Current Allergies, Complete Past Medical History, Past Surgical History, Family History, and Social History were reviewed in [employer] record.  ROS  The following are not active complaints unless bolded Hoarseness, sore throat, dysphagia, dental problems, itching, sneezing,  nasal congestion or discharge of excess mucus or purulent secretions, ear ache,   fever, chills, sweats, unintended wt loss or wt gain, classically pleuritic or exertional cp,  orthopnea pnd or leg swelling, presyncope, palpitations, abdominal pain, anorexia, nausea, vomiting, diarrhea  or change in bowel habits or change in bladder habits, change in stools or change in urine, dysuria, hematuria,  rash, arthralgias, visual complaints, headache, numbness, weakness or ataxia/dizziness  or problems with walking or coordination,  change in mood/affect or memory.        Current Meds/ unable to verify accuracy of this list   Medication Sig  . aspirin 81 MG tablet Take 81 mg by mouth daily.    Marland Kitchen atenolol (TENORMIN) 25 MG tablet TAKE 1/2 TABLET BY MOUTH EVERY MORNING.  . Chlorpheniramine-Acetaminophen (CORICIDIN HBP COLD/FLU PO) Take by mouth.  . famotidine (PEPCID) 20 MG tablet Take 20 mg by mouth 2 (two) times daily.  Marland Kitchen glimepiride (AMARYL) 2 MG tablet TAKE 1/2 TABLET BY MOUTH ONCE DAILY FOR DIABETES.  Marland Kitchen losartan (COZAAR) 100 MG tablet Take 1 tablet (100 mg total) daily by mouth.  . Multiple Vitamins-Minerals (VITEYES AREDS ADVANCED PO) Take by mouth.  . naproxen sodium (ALEVE) 220 MG tablet Take 220 mg by mouth.  Marland Kitchen omeprazole (PRILOSEC) 20 MG capsule TAKE ONE CAPSULE BY MOUTH DAILY.  . [DISCONTINUED] atorvastatin (LIPITOR) 10 MG tablet Take 1 tablet (10 mg total) by mouth daily.  . [DISCONTINUED] fenofibrate (TRICOR) 145 MG tablet Take 1 tablet (145 mg total) daily by  mouth.             Objective:   Physical Exam  Somber wm nad  05/16/2015        209 > 06/28/2015    213  > 12/27/2015   211 > 06/25/2016  206 > 12/26/2016  203> 06/26/2017  211 > 12/28/2017  189     02/26/15 213 lb (96.616 kg)  02/12/15 213 lb 12 oz (96.956 kg)  04/11/14 216 lb 6.4 oz (98.158 kg)    Vital signs reviewed - Note on arrival 02  sats  94% on RA      HEENT: nl dentition, turbinates bilaterally, and oropharynx. Nl external ear canals without cough reflex   NECK :  without JVD/Nodes/TM/ nl carotid upstrokes bilaterally   LUNGS: no acc muscle use,  Nl contour chest with very minimal insp crackles both bases/ / no cough on deep inspiration    CV:  RRR  no s3 or murmur or increase in P2, and no edema   ABD:  soft and nontender with nl inspiratory excursion in the supine position. No bruits or organomegaly appreciated, bowel sounds nl  MS:  Nl gait/ ext warm without deformities, calf tenderness, cyanosis or clubbing No obvious joint restrictions   SKIN: warm and dry without lesions    NEURO:  alert, approp, nl sensorium with  no motor or cerebellar deficits apparent.              Assessment & Plan:

## 2017-12-28 NOTE — Progress Notes (Signed)
    SIX MIN WALK 12/28/2017 12/26/2016 06/28/2015 02/26/2015  Medications Aspirin 81mg , Atenolol 25mg , Glimepiride 2mg , Omeprazole 20mg , Fenofibrate 145mg . All meds were taken this morning around 8am.  - - -  Supplimental Oxygen during Test? (L/min) No No No No  Laps 7 - - -  Partial Lap (in Meters) 0 - - -  Baseline BP (sitting) 116/74 - - -  Baseline Heartrate 76 - - -  Baseline Dyspnea (Borg Scale) 1 - - -  Baseline Fatigue (Borg Scale) 1 - - -  Baseline SPO2 98 - - -  BP (sitting) 124/72 - - -  Heartrate 80 - - -  Dyspnea (Borg Scale) 1 - - -  Fatigue (Borg Scale) 1 - - -  SPO2 92 - - -  BP (sitting) 122/70 - - -  Heartrate 72 - - -  SPO2 96 - - -  Stopped or Paused before Six Minutes No - - -  Distance Completed 336 - - -  Tech Comments: Patient was able to complete the entire 107mw without stopping. Denied any calf and chest pain after walk. Did state he has some slight SOB after the walk. O2 was not needed.  Pt. was able to complete 3 full laps without stopping. No c/o of SOB or CP.  normal pace/no SOB//lmr normal pace/no SOB//lmr

## 2017-12-28 NOTE — Patient Instructions (Signed)
Please schedule a follow up visit in 3 months but call sooner if needed  with all medications /inhalers/ solutions in hand so we can verify exactly what you are taking. This includes all medications from all doctors and over the counters  With HRCT prior to OV

## 2017-12-29 ENCOUNTER — Encounter: Payer: Self-pay | Admitting: Family Medicine

## 2017-12-29 ENCOUNTER — Ambulatory Visit: Payer: Medicare Other | Admitting: Family Medicine

## 2017-12-29 ENCOUNTER — Ambulatory Visit: Payer: Medicare Other | Admitting: Internal Medicine

## 2017-12-29 VITALS — BP 132/76 | HR 72 | Temp 97.5°F | Ht 70.5 in | Wt 190.0 lb

## 2017-12-29 DIAGNOSIS — R42 Dizziness and giddiness: Secondary | ICD-10-CM

## 2017-12-29 NOTE — Progress Notes (Signed)
Nathan Howard - 77 y.o. male MRN [id number]  Date of birth: Jun 28, 1941  SUBJECTIVE:  Including CC & ROS.  Chief Complaint  Patient presents with  . Dizziness    Nathan Howard is a 77 y.o. male that is presenting with vertigo. Ongoing for one week. Occurs first thing in the morning improves throughout the day. Has not taken anything for his symptoms. Denies headaches. Admits to sinus pressure. He has fallen a couple of times from the vertigo. He feels like the room is spinning. Hasn't had any similar occurrences. Intermittent in nature. No ringing of the ears.    Review of Systems  Constitutional: Negative for fever.  Respiratory: Negative for cough.   Cardiovascular: Negative for chest pain.  Gastrointestinal: Negative for abdominal pain.  Neurological: Positive for dizziness. Negative for light-headedness.  Hematological: Negative for adenopathy.    HISTORY: Past Medical, Surgical, Social, and Family History Reviewed & Updated per EMR.   Pertinent Historical Findings include:  Past Medical History:  Diagnosis Date  . Anal fissure   . Anemia   . Arthritis   . Colitis 2011   Dr Sharlett Iles  . Diverticulosis of colon (without mention of hemorrhage)   . Esophageal reflux   . Hemorrhoids   . Hyperlipemia   . Hypertension   . Personal history of colonic polyps 2000   adenomatous polyp Dr Velora Heckler  . Pneumonia   . Pneumonia     X 2  , as child  & 1974  . Renal insufficiency   . Skin cancer    right ear  . Type II or unspecified type diabetes mellitus without mention of complication, not stated as uncontrolled   . Vitamin B12 deficiency     Past Surgical History:  Procedure Laterality Date  . APPENDECTOMY  1953  . arm surgery     benign growth removed left arm  . CATARACT EXTRACTION     OD  . COLONOSCOPY W/ POLYPECTOMY     Dr Sharlett Iles  . [place]   x 2  . SKIN BIOPSY Right 2014   benign  . TONSILLECTOMY  1961  . UPPER GASTROINTESTINAL  ENDOSCOPY     gastritis; Dr Sharlett Iles    No Known Allergies  Family History  Problem Relation Age of Onset  . Heart attack Father 53  . Alcohol abuse Father   . Diabetes Mother   . Heart attack Mother 67  . Dementia Mother   . Stomach cancer Maternal Uncle   . Cancer Maternal Aunt        ? primary  . Cancer Paternal Aunt        ? primary  . Cancer Paternal Uncle        X2 ; prostate& colon  . Heart disease Maternal Grandfather        ? MI  . Arthritis Paternal Grandfather   . Lung cancer Maternal Grandmother        smoked  . Stroke Neg Hx      Social History   Socioeconomic History  . Marital status: Married    Spouse name: Not on file  . Number of children: 0  . Years of education: Not on file  . Highest education level: Not on file  Social Needs  . Financial resource strain: Not on file  . Food insecurity - worry: Not on file  . Food insecurity - inability: Not on file  . Transportation needs - medical: Not on file  . Transportation  needs - non-medical: Not on file  Occupational History  . Occupation: retired Best boy: [employer]  Tobacco Use  . Smoking status: Former Smoker    Packs/day: 1.00    Years: 3.00    Pack years: 3.00    Types: Cigarettes    Last attempt to quit: 12/22/1961    Years since quitting: 56.0  . Smokeless tobacco: Never Used  Substance and Sexual Activity  . Alcohol use: No    Alcohol/week: 0.0 oz  . Drug use: No  . Sexual activity: Not on file  Other Topics Concern  . Not on file  Social History Narrative  . Not on file     PHYSICAL EXAM:  VS: BP 132/76 (BP Location: Left Arm, Patient Position: Sitting, Cuff Size: Normal)   Pulse 72   Temp (!) 97.5 F (36.4 C) (Oral)   Ht 5' 10.5" (1.791 m)   Wt 190 lb (86.2 kg)   SpO2 95%   BMI 26.88 kg/m  Physical Exam Gen: NAD, alert, cooperative with exam, well-appearing ENT: normal lips, normal nasal mucosa, normal tympanic membranes bilaterally Eye: normal EOM,  normal conjunctiva and lids CV:  no edema, +2 pedal pulses   Resp: no accessory muscle use, non-labored,   Skin: no rashes, no areas of induration  Neuro: normal tone, normal sensation to touch, normal finger to nose testing, normal saccades testing, normal alternating horizontal eye testing Psych:  normal insight, alert and oriented MSK: Normal gait, normal strength     ASSESSMENT & PLAN:   Vertigo Likely BPPV.  Does not appear to be hypotension. Normal neurological exam today. Not suggestive of tumor or intracranial process. - Counseled on the Epley maneuver - If no improvement could consider referral to physical therapy or meclizine

## 2017-12-29 NOTE — Patient Instructions (Signed)
Please try the Epley maneuver and please let me know if it doesn't improve your symptoms.  How to Perform the Epley Maneuver The Epley maneuver is an exercise that relieves symptoms of vertigo. Vertigo is the feeling that you or your surroundings are moving when they are not. When you feel vertigo, you may feel like the room is spinning and have trouble walking. Dizziness is a little different than vertigo. When you are dizzy, you may feel unsteady or light-headed. You can do this maneuver at home whenever you have symptoms of vertigo. You can do it up to 3 times a day until your symptoms go away. Even though the Epley maneuver may relieve your vertigo for a few weeks, it is possible that your symptoms will return. This maneuver relieves vertigo, but it does not relieve dizziness. What are the risks? If it is done correctly, the Epley maneuver is considered safe. Sometimes it can lead to dizziness or nausea that goes away after a short time. If you develop other symptoms, such as changes in vision, weakness, or numbness, stop doing the maneuver and call your health care provider. How to perform the Epley maneuver 1. Sit on the edge of a bed or table with your back straight and your legs extended or hanging over the edge of the bed or table. 2. Turn your head halfway toward the affected ear or side. 3. Lie backward quickly with your head turned until you are lying flat on your back. You may want to position a pillow under your shoulders. 4. Hold this position for 30 seconds. You may experience an attack of vertigo. This is normal. 5. Turn your head to the opposite direction until your unaffected ear is facing the floor. 6. Hold this position for 30 seconds. You may experience an attack of vertigo. This is normal. Hold this position until the vertigo stops. 7. Turn your whole body to the same side as your head. Hold for another 30 seconds. 8. Sit back up. You can repeat this exercise up to 3 times a  day. Follow these instructions at home:  After doing the Epley maneuver, you can return to your normal activities.  Ask your health care provider if there is anything you should do at home to prevent vertigo. He or she may recommend that you: ? Keep your head raised (elevated) with two or more pillows while you sleep. ? Do not sleep on the side of your affected ear. ? Get up slowly from bed. ? Avoid sudden movements during the day. ? Avoid extreme head movement, like looking up or bending over. Contact a health care provider if:  Your vertigo gets worse.  You have other symptoms, including: ? Nausea. ? Vomiting. ? Headache. Get help right away if:  You have vision changes.  You have a severe or worsening headache or neck pain.  You cannot stop vomiting.  You have new numbness or weakness in any part of your body. Summary  Vertigo is the feeling that you or your surroundings are moving when they are not.  The Epley maneuver is an exercise that relieves symptoms of vertigo.  If the Epley maneuver is done correctly, it is considered safe. You can do it up to 3 times a day. This information is not intended to replace advice given to you by your health care provider. Make sure you discuss any questions you have with your health care provider. Document Released: 12/13/2013 Document Revised: 10/28/2016 Document Reviewed: 10/28/2016 Elsevier Interactive  Patient Education  2017 [employer]  

## 2017-12-30 ENCOUNTER — Encounter: Payer: Self-pay | Admitting: Internal Medicine

## 2017-12-30 DIAGNOSIS — R42 Dizziness and giddiness: Secondary | ICD-10-CM | POA: Insufficient documentation

## 2017-12-30 NOTE — Assessment & Plan Note (Signed)
Likely BPPV.  Does not appear to be hypotension. Normal neurological exam today. Not suggestive of tumor or intracranial process. - Counseled on the Epley maneuver - If no improvement could consider referral to physical therapy or meclizine

## 2017-12-30 NOTE — Assessment & Plan Note (Signed)
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 -Smoking history encompassed less than 5 years. He's had no significant occupational exposures - 02/26/2015  Walked RA x 3 laps @ 185 ft each stopped due to  End of study , nl pace, no desats - 02/26/2015 collagen vasc screen sent > neg with esr 28  - 02/26/2015 rec max gerd rx/ diet and > improved 05/16/15  - 06/28/2015  VC 3.65 (85%) no obst and dlco 56 corrects  to 81  - 06/28/2015  Walked RA x 3 laps @ 185 ft each stopped due to End of study, nl pace, no sob or desat  - PFT's  06/25/2016   VC  3.85  (91%)  DLCO  53/51 % corrects to 72 % for alv volume  - 12/26/2016  Walked RA x 3 laps @ 185 ft each stopped due to  End of study, nl pace, no sob or desat  - PFTs  06/26/2017   VC  3.33 (79%)   DLCO  62/59  %  Corrects to 93%    - 12/28/2017  6 m walk  =  336 sats ok      Clinically doing well and not interested in escalating rx   Discussed in detail all the  indications, usual  risks and alternatives  relative to the benefits with patient who agrees to proceed with conservative f/u as outlined    Each maintenance medication was reviewed in detail including most importantly the difference between maintenance and as needed and under what circumstances the prns are to be used.  Please see AVS for specific  Instructions which are unique to this visit and I personally typed out  which were reviewed in detail in writing with the patient and a copy provided.    F/u in 3 m with repeat HRCT

## 2018-01-23 ENCOUNTER — Other Ambulatory Visit: Payer: Self-pay | Admitting: Internal Medicine

## 2018-01-24 ENCOUNTER — Other Ambulatory Visit: Payer: Self-pay | Admitting: Internal Medicine

## 2018-02-17 ENCOUNTER — Other Ambulatory Visit: Payer: Self-pay | Admitting: Internal Medicine

## 2018-03-25 ENCOUNTER — Ambulatory Visit (INDEPENDENT_AMBULATORY_CARE_PROVIDER_SITE_OTHER)
Admission: RE | Admit: 2018-03-25 | Discharge: 2018-03-25 | Disposition: A | Discharge status: Home / Self Care | Payer: Medicare Other | Source: Ambulatory Visit | Attending: Internal Medicine | Admitting: Internal Medicine

## 2018-03-25 DIAGNOSIS — J8410 Pulmonary fibrosis, unspecified: Secondary | ICD-10-CM | POA: Diagnosis not present

## 2018-03-29 ENCOUNTER — Ambulatory Visit: Payer: Medicare Other | Admitting: Internal Medicine

## 2018-03-29 ENCOUNTER — Encounter: Payer: Self-pay | Admitting: Internal Medicine

## 2018-03-29 VITALS — BP 136/68 | HR 85 | Ht 70.5 in | Wt 199.8 lb

## 2018-03-29 DIAGNOSIS — J8410 Pulmonary fibrosis, unspecified: Secondary | ICD-10-CM

## 2018-03-29 NOTE — Progress Notes (Signed)
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 Pulmonary Consult    Referred by Dr. Unice Cobble. Pt states having SOB since Dec 2015 after developing URI. He gets SOB with doing yard work and with singing. He states that he also has some PND and occ cough with white to clear sputum.   abruptly ill week before xmas, sense of acute sinus congestion, sore throat chest burning during severe coughing fits assoc with infected tooth rx with clindamycin x 40 days and tooth better, all symptoms improved except now doe x flight of steps or yardwork or fast walking. rec Change prilosec to omeprazole 40 mg Take 30-60 min before first meal of the day and Pepcid ac 20 at bedtime until return GERD diet   12/27/2015  f/u ov/Nathan Howard re: PF/ no change ex tol Chief Complaint  Patient presents with  . Follow-up    CXR done today. Breathing is unchanged. No new co's today.   rec No change rx = ppi qam/ h2hs    06/25/2016  f/u ov/Nathan Howard re: PF/ maint on prilosec but worried about press so stopped pepcid Chief Complaint  Patient presents with  . Follow-up    PFT done today. Breathing is overall doing well.    able to work in the yard ok, some trouble bending over at waist, no limiting doe rec Continue prilosec otc 20mg   Take 30-60 min before first meal of the day and Pepcid ac (famotidine) 20 mg one @  bedtime       12/26/2016  f/u ov/Nathan Howard re: PF/ no longer on PPI due to concern with B12 absorption Chief Complaint  Patient presents with  . Follow-up    Breathing has been  worse with colder weather. He is not coughing much, but does have some PND.    now on pepcid 20 mg bid and worse since stopped ppi though no clear cause and effect Has sense of pnds but no excess/ purulent sputum or mucus plugs   rec Resume  prilosec otc 20mg   Take 30-60 min before first meal of the day and Pepcid ac (famotidine) 20 mg one @  bedtime until return       06/26/2017  f/u ov/Nathan Howard re: PF / omeprazole 20 mg / pepcid   - also requesting labs for Primary care as plans to change to [facility] office but not yet established  Chief Complaint  Patient presents with  . Follow-up    PFT's done today. Breathing is progressively worse.   no regular walking  Any more  Doe = MMRC1 = can walk nl pace, flat grade, can't hurry or go uphills or steps s sob   rec No change in your medications  Please schedule a follow up visit in 6 months but call sooner if needed with 6 minute walk on return      12/28/2017  f/u ov/Nathan Howard re:  PF  Chief Complaint  Patient presents with  . Follow-up    6MW done today.  He states he had a cold in early Dec 2018, and is now having sinus issues and vertigo.   intermittent dizziness since late dec 2018 esp in am's p stirs, has never tried lying back down to see if resolves  Mild nasal congestion No decline ex tol = doe = MMRC1 = can walk nl pace, flat grade, can't hurry or go uphills or steps s sob   Stopped all acid suppression one week prior to OV  Due to concerns they might have caused dizziness but no change in resp symptoms or dizziness since d/c'd and has pcp eval scheduled for 12/29/17.  rec Please schedule a follow up visit in 3 months but call sooner if needed  with all medications /inhalers/ solutions in hand so we can verify exactly what you are taking. 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 Follow-up     HRCT done 03/26/18. Breathing is about the same, maybe some worse.   Dyspnea:  yardwork only, some worse with ex of heat/ cold / not monitoring sats with ex  Cough: minimal  Sleep: ok  No other obvious day to day or daytime variability or assoc excess/ purulent sputum or mucus plugs or hemoptysis or cp or chest tightness, subjective wheeze or overt sinus or hb symptoms. No unusual exposure hx or h/o childhood pna/ asthma or knowledge of premature birth.  Sleeping ok flat without nocturnal  or early am exacerbation  of respiratory  c/o's or need for noct saba. Also denies any obvious fluctuation of symptoms with weather or environmental changes or other aggravating or alleviating factors except as outlined above   Current Allergies, Complete Past Medical History, Past Surgical History, Family History, and Social History were reviewed in [employer] record.  ROS  The following are not active complaints unless bolded Hoarseness, sore throat, dysphagia, dental problems, itching, sneezing,  nasal congestion or discharge of excess mucus or purulent secretions, ear ache,   fever, chills, sweats, unintended wt loss or wt gain, classically pleuritic or exertional cp,  orthopnea pnd or leg swelling, presyncope, palpitations, abdominal pain, anorexia, nausea, vomiting, diarrhea  or change in bowel habits or change in bladder habits, change in stools or change in urine, dysuria, hematuria,  rash, arthralgias, visual complaints, headache, numbness, weakness or ataxia or problems with walking or coordination,  change in mood/affect or memory.        Current Meds  Medication Sig  . aspirin 81 MG tablet Take 81 mg by mouth daily.    Marland Kitchen atenolol (TENORMIN) 25 MG tablet TAKE 1/2 TABLET BY MOUTH EVERY MORNING.  . Chlorpheniramine-Acetaminophen (CORICIDIN HBP COLD/FLU PO) Take by mouth.  . fenofibrate (TRICOR) 145 MG tablet TAKE ONE TABLET BY MOUTH ONCE DAILY.  Marland Kitchen glimepiride (AMARYL) 2 MG tablet  TAKE 1/2 TABLET BY MOUTH ONCE DAILY FOR DIABETES.  Marland Kitchen losartan (COZAAR) 100 MG tablet TAKE (1) TABLET BY MOUTH ONCE DAILY.  . Multiple Vitamins-Minerals (VITEYES AREDS ADVANCED PO) Take by mouth.  . naproxen sodium (ALEVE) 220 MG tablet Take 220 mg by mouth.  Marland Kitchen omeprazole (PRILOSEC) 20 MG capsule TAKE ONE CAPSULE BY MOUTH DAILY.                  Objective:   Physical Exam  amb wm nad  05/16/2015        209 > 06/28/2015    213  > 12/27/2015   211 > 06/25/2016  206 > 12/26/2016  203> 06/26/2017  211 >   03/29/2018 199     02/26/15 213 lb (96.616 kg)  02/12/15 213 lb 12 oz (96.956 kg)  04/11/14 216 lb 6.4 oz (98.158 kg)     Vital signs reviewed - Note on arrival 02 sats  93% on RA       HEENT: nl dentition, turbinates bilaterally, and oropharynx. Nl external ear canals without cough reflex   NECK :  without JVD/Nodes/TM/ nl carotid upstrokes bilaterally   LUNGS: no acc muscle use,  Nl contour chest   insp crackles both bases/ / no cough on deep inspiration    CV:  RRR  no s3 or murmur or increase in P2, and no edema   ABD:  soft and nontender with nl inspiratory excursion in the supine position. No bruits or organomegaly appreciated, bowel sounds nl  MS:  Nl gait/ ext warm without deformities, calf tenderness, cyanosis or clubbing No obvious joint restrictions   SKIN: warm and dry without lesions    NEURO:  alert, approp, nl sensorium with  no motor or cerebellar deficits apparent.                Assessment & Plan:

## 2018-03-29 NOTE — Patient Instructions (Addendum)
Add pepcid 20 mg at bedtime   Please schedule a follow up office visit in 2- 4 weeks, sooner if needed  with all medications /inhalers/ solutions in hand so we can verify exactly what you are taking. This includes all medications from all doctors and over the counters

## 2018-03-30 ENCOUNTER — Encounter: Payer: Self-pay | Admitting: Internal Medicine

## 2018-03-30 MED ORDER — FAMOTIDINE 20 MG PO TABS: ORAL_TABLET | ORAL | Status: DC

## 2018-03-30 NOTE — Assessment & Plan Note (Addendum)
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 -Smoking history encompassed less than 5 years. He's had no significant occupational exposures - 02/26/2015  Walked RA x 3 laps @ 185 ft each stopped due to  End of study , nl pace, no desats - 02/26/2015 collagen vasc screen sent > neg with esr 28  - 02/26/2015 rec max gerd rx/ diet and > improved 05/16/15  - 06/28/2015  VC 3.65 (85%) no obst and dlco 56 corrects  to 81  - 06/28/2015  Walked RA x 3 laps @ 185 ft each stopped due to End of study, nl pace, no sob or desat  - PFT's  06/25/2016   VC  3.85  (91%)  DLCO  53/51 % corrects to 72 % for alv volume  - 12/26/2016  Walked RA x 3 laps @ 185 ft each stopped due to  End of study, nl pace, no sob or desat  - PFTs  06/26/2017   VC  3.33 (79%)   DLCO  62/59  %  Corrects to 93%    - 12/28/2017  6 m walk  =  336 sats ok   - HRCT chest 03/25/18 :  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   I had an extended discussion with the patient reviewing all relevant studies completed to date and  lasting 15 to 20 minutes of a 25 minute visit on the following ongoing concerns:   1) Meeds criteria for probable UIP and candidate for anti-fibrotics   2) Not enthusiastic about starting them and have trouble convincing him even to take PPI/ pepcid as rec  at hs based on : Use of PPI is associated with improved survival time and with decreased radiologic fibrosis per King's study published in AJRCCM vol 184 p1390.  Dec 2011 and also may have other beneficial effects as per the latest review in [place] vol 193 U7253 Jun 20016.  This may not always be cause and effect, but given how universally unimpressive and expensive  all the other  Drugs developed to day  have been for pf,   rec  Max gerd rx/ diet/ lifestyle modification and f/u with serial walking sats and lung  volumes for now to put more points on the curve / establish firm baseline before considering additional measures  3) he did agree to start monitoring sats with walking/ max rx for gerd and f/u for his 6 m pfts and if any downward trend  in sats in meantime go ahead and start OFEV.  Each maintenance medication was reviewed in detail including most importantly the difference between maintenance and as needed and under what circumstances the prns are to be used.  Please see AVS for specific  Instructions which are unique to this visit and I personally typed out  which were reviewed in detail in writing with the patient and a copy provided.

## 2018-04-19 ENCOUNTER — Other Ambulatory Visit: Payer: Self-pay | Admitting: Internal Medicine

## 2018-04-20 ENCOUNTER — Other Ambulatory Visit: Payer: Self-pay | Admitting: Internal Medicine

## 2018-04-26 ENCOUNTER — Ambulatory Visit: Payer: Medicare Other | Admitting: Internal Medicine

## 2018-04-26 ENCOUNTER — Other Ambulatory Visit (INDEPENDENT_AMBULATORY_CARE_PROVIDER_SITE_OTHER): Payer: Medicare Other

## 2018-04-26 ENCOUNTER — Encounter: Payer: Self-pay | Admitting: Internal Medicine

## 2018-04-26 ENCOUNTER — Telehealth: Payer: Self-pay | Admitting: Internal Medicine

## 2018-04-26 VITALS — BP 128/78 | HR 83 | Ht 70.5 in | Wt 201.6 lb

## 2018-04-26 DIAGNOSIS — J8410 Pulmonary fibrosis, unspecified: Secondary | ICD-10-CM

## 2018-04-26 DIAGNOSIS — IMO0002 Reserved for concepts with insufficient information to code with codable children: Secondary | ICD-10-CM

## 2018-04-26 DIAGNOSIS — E1129 Type 2 diabetes mellitus with other diabetic kidney complication: Secondary | ICD-10-CM

## 2018-04-26 DIAGNOSIS — R300 Dysuria: Secondary | ICD-10-CM | POA: Diagnosis not present

## 2018-04-26 DIAGNOSIS — E1165 Type 2 diabetes mellitus with hyperglycemia: Secondary | ICD-10-CM | POA: Diagnosis not present

## 2018-04-26 LAB — HEPATIC FUNCTION PANEL
ALT: 12 U/L (ref 0–53)
AST: 17 U/L (ref 0–37)
Albumin: 4.3 g/dL (ref 3.5–5.2)
Alkaline Phosphatase: 33 U/L — ABNORMAL LOW (ref 39–117)
BILIRUBIN TOTAL: 0.4 mg/dL (ref 0.2–1.2)
Bilirubin, Direct: 0.1 mg/dL (ref 0.0–0.3)
TOTAL PROTEIN: 8 g/dL (ref 6.0–8.3)

## 2018-04-26 LAB — URINALYSIS
Bilirubin Urine: NEGATIVE
Hgb urine dipstick: NEGATIVE
KETONES UR: NEGATIVE
Leukocytes, UA: NEGATIVE
Nitrite: NEGATIVE
Specific Gravity, Urine: 1.015 (ref 1.000–1.030)
TOTAL PROTEIN, URINE-UPE24: NEGATIVE
URINE GLUCOSE: NEGATIVE
UROBILINOGEN UA: 0.2 (ref 0.0–1.0)
pH: 6.5 (ref 5.0–8.0)

## 2018-04-26 LAB — BASIC METABOLIC PANEL
BUN: 21 mg/dL (ref 6–23)
CALCIUM: 9.9 mg/dL (ref 8.4–10.5)
CHLORIDE: 104 meq/L (ref 96–112)
CO2: 28 mEq/L (ref 19–32)
CREATININE: 1.57 mg/dL — AB (ref 0.40–1.50)
GFR: 45.5 mL/min — ABNORMAL LOW (ref >60.00)
Glucose, Bld: 139 mg/dL — ABNORMAL HIGH (ref 70–99)
Potassium: 4.3 mEq/L (ref 3.5–5.1)
Sodium: 140 mEq/L (ref 135–145)

## 2018-04-26 LAB — SEDIMENTATION RATE: SED RATE: 49 mm/h — AB (ref 0–20)

## 2018-04-26 LAB — HEMOGLOBIN A1C: HEMOGLOBIN A1C: 7.2 % — AB (ref 4.6–6.5)

## 2018-04-26 NOTE — Progress Notes (Signed)
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 Pulmonary Consult    Referred by Dr. Unice Cobble. Pt states having SOB since Dec 2015 after developing URI. He gets SOB with doing yard work and with singing. He states that he also has some PND and occ cough with white to clear sputum.   abruptly ill week before xmas, sense of acute sinus congestion, sore throat chest burning during severe coughing fits assoc with infected tooth rx with clindamycin x 40 days and tooth better, all symptoms improved except now doe x flight of steps or yardwork or fast walking. rec Change prilosec to omeprazole 40 mg Take 30-60 min before first meal of the day and Pepcid ac 20 at bedtime until return GERD diet   12/27/2015  f/u ov/Nathan Howard re: PF/ no change ex tol Chief Complaint  Patient presents with  . Follow-up    CXR done today. Breathing is unchanged. No new co's today.   rec No change rx = ppi qam/ h2hs    06/25/2016  f/u ov/Nathan Howard re: PF/ maint on prilosec but worried about press so stopped pepcid Chief Complaint  Patient presents with  . Follow-up    PFT done today. Breathing is overall doing well.    able to work in the yard ok, some trouble bending over at waist, no limiting doe rec Continue prilosec otc 60m  Take 30-60 min before first meal of the day and Pepcid ac (famotidine) 20 mg one @  bedtime       12/26/2016  f/u ov/Nathan Howard re: PF/ no longer on PPI due to concern with B12 absorption Chief Complaint  Patient presents with  . Follow-up    Breathing has been  worse with colder weather. He is not coughing much, but does have some PND.    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 Follow-up    HRCT done 03/26/18. Breathing is about the same, maybe some worse.   Dyspnea:  yardwork only, some worse with ex of heat/ cold / not monitoring sats with ex  Cough: minimal  Sleep: ok rec Resume pepcid 2031ms   04/26/2018  f/u ov/Nathan Howard re:  uip / back on pepcid 20 mg hs Chief Complaint  Patient presents with  . Follow-up    heat and cold affect breathing, SOB with exertion, wheezing and rattling in chest especially when lying down, non-productive cough occassionaly  Dyspnea:  Mostly with yardwork/ bending over   SABA use:  None Cough: some flare with 'head cold" x one  week with subj wheeze/ non producitive worse at hs    No obvious patterns in  day to day or daytime variability or assoc excess/ purulent sputum or mucus plugs or hemoptysis or cp or chest tightness,  or overt sinus or hb symptoms. No unusual exposure hx or h/o childhood pna/ asthma or knowledge of premature birth.   Also denies any obvious fluctuation of symptoms with weather or environmental changes or other aggravating or alleviating factors except as outlined above   Current Allergies, Complete Past Medical History, Past Surgical History, Family History, and Social History were reviewed in [employer] record.  Nathan  The following are not active complaints unless bolded Hoarseness, sore throat, dysphagia, dental problems, itching, sneezing,  nasal congestion or discharge of excess mucus or purulent secretions, ear  ache,   fever, chills, sweats, unintended wt loss or wt gain, classically pleuritic or exertional cp,  orthopnea pnd or arm/hand swelling  or leg swelling, presyncope, palpitations, abdominal pain, anorexia, nausea, vomiting, diarrhea  or change in bowel habits or change in bladder habits, change in stools or change in urine, dysuria, hematuria,  rash, arthralgias, visual complaints, headache, numbness, weakness or ataxia or problems with walking or coordination,  change in mood or  memory.        Current Meds  Medication Sig  . acetaminophen (TYLENOL) 500 MG tablet Take 500 mg by mouth every 6 (six) hours as needed.  Marland Kitchen aspirin 81 MG tablet Take 81 mg by mouth daily.    Marland Kitchen atenolol (TENORMIN) 25 MG tablet TAKE 1/2 TABLET BY MOUTH EVERY MORNING.  . Chlorpheniramine-Acetaminophen (CORICIDIN HBP COLD/FLU PO) Take by mouth.  . famotidine (PEPCID) 20 MG tablet One at bedtime  . fenofibrate (TRICOR) 145 MG tablet TAKE ONE TABLET BY MOUTH ONCE DAILY.  Marland Kitchen glimepiride (AMARYL) 2 MG tablet TAKE 1/2 TABLET BY MOUTH ONCE DAILY FOR DIABETES.  Marland Kitchen losartan (COZAAR) 100 MG tablet TAKE (1) TABLET BY MOUTH ONCE DAILY.  . Multiple Vitamins-Minerals (VITEYES AREDS ADVANCED PO) Take by mouth.  . naproxen sodium (ALEVE) 220 MG tablet Take 220 mg by mouth.  Marland Kitchen omeprazole (PRILOSEC) 20 MG capsule TAKE ONE CAPSULE BY MOUTH DAILY.              Objective:   Physical Exam  amb wm nad   05/16/2015        209 > 06/28/2015    213  > 12/27/2015   211 > 06/25/2016  206 > 12/26/2016  203> 06/26/2017  211 >   03/29/2018 199 >  04/26/2018  201     02/26/15 213 lb (96.616 kg)  02/12/15 213 lb 12 oz (96.956 kg)    Vital signs reviewed - Note on arrival 02 sats  96% on RA            HEENT: nl dentition, turbinates bilaterally, and oropharynx. Nl external ear canals without cough reflex   NECK :  without JVD/Nodes/TM/ nl carotid upstrokes bilaterally   LUNGS: no acc muscle use,  Nl contour chest  With insp crackles on both bases,   cough on insp   maneuvers   CV:  RRR  no s3 or murmur or increase in P2, and no edema   ABD:  soft and nontender with nl inspiratory excursion in the supine position. No bruits or organomegaly appreciated, bowel sounds nl  MS:  Nl gait/ ext warm without deformities, calf tenderness, cyanosis or clubbing No obvious joint restrictions   SKIN: warm and  dry without lesions    NEURO:  alert, approp, nl sensorium with  no motor or cerebellar deficits apparent.          Labs ordered 04/26/2018   U/a/ lfts/ [id number](latter at his request)     Lab Results  Component Value Date   ESRSEDRATE 49 (H) 04/26/2018   ESRSEDRATE 31 (H) 06/26/2017   ESRSEDRATE 23 (H) 12/26/2016                Assessment & Plan:

## 2018-04-26 NOTE — Patient Instructions (Addendum)
Please remember to go to the lab department downstairs in the basement  for your tests - we will call you with the results when they are available.   For cough try delsym 2 tsp every 12 hours as needed   Please schedule a follow up office visit in 2 weeks, sooner if needed with pfts on return  - note sats no worse with ex now than 12/2016

## 2018-04-26 NOTE — Telephone Encounter (Signed)
Called and spoke to patient. Patient has PFT scheduled for the 5/21. Patient stated he could come on 5/22 as well so scheduled his OV with MW on 5/22 at 4:30. Nothing further needed at this time.

## 2018-04-27 ENCOUNTER — Encounter: Payer: Self-pay | Admitting: Internal Medicine

## 2018-04-27 NOTE — Assessment & Plan Note (Signed)
U/a unimpressive > f/u urology planned

## 2018-04-27 NOTE — Assessment & Plan Note (Signed)
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 -Smoking history encompassed less than 5 years. He's had no significant occupational exposures - 02/26/2015  Walked RA x 3 laps @ 185 ft each stopped due to  End of study , nl pace, no desats - 02/26/2015 collagen vasc screen sent > neg with esr 28  - 02/26/2015 rec max gerd rx/ diet and > improved 05/16/15  - 06/28/2015  VC 3.65 (85%) no obst and dlco 56 corrects  to 81  - 06/28/2015  Walked RA x 3 laps @ 185 ft each stopped due to End of study, nl pace, no sob or desat  - PFT's  06/25/2016   VC  3.85  (91%)  DLCO  53/51 % corrects to 72 % for alv volume  - 12/26/2016  Walked RA x 3 laps @ 185 ft each stopped due to  End of study, nl pace, no sob /sats 91% at end   - PFTs  06/26/2017   VC  3.33 (79%)   DLCO  62/59  %  Corrects to 93%    - 12/28/2017  6 m walk  =  336 sats ok   - HRCT chest 03/25/18 :  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  - 04/26/2018  Walked RA x 3 laps @ 185 ft each stopped due to  End of study, nl pace, sat 92% at end, min sob   He actually had better sats walking today despite his "head cold and wheezing" than he did 12/2016 but there is evidence to support uip and he may be candidate for antifibrotic rx but first needs to return for pfts for apples to apples comparison for better baseline.   Discussed in detail all the  indications, usual  risks and alternatives  relative to the benefits with patient who agrees to proceed with w/u as outlined.     I had an extended discussion with the patient reviewing all relevant studies completed to date and  lasting 15 to 20 minutes of a 25 minute visit    Each maintenance medication was reviewed in detail including most importantly the difference between maintenance and prns and under what circumstances the prns are to be triggered  using an action plan format that is not reflected in the computer generated alphabetically organized AVS.    Please see AVS for specific instructions unique to this visit that I personally wrote and verbalized to the the pt in detail and then reviewed with pt  by my nurse highlighting any  changes in therapy recommended at today's visit to their plan of care.

## 2018-04-27 NOTE — Progress Notes (Signed)
Spoke with pt and notified of results per Dr. Wert. Pt verbalized understanding and denied any questions. 

## 2018-04-27 NOTE — Assessment & Plan Note (Signed)
Lab Results  Component Value Date   HGBA1C 7.2 (H) 04/26/2018   HGBA1C 7.5 (H) 11/17/2017   HGBA1C 7.8 (H) 06/26/2017      Adequate control on present rx, reviewed in detail with pt > no change in rx needed > f/u pcp planned

## 2018-05-11 ENCOUNTER — Ambulatory Visit (INDEPENDENT_AMBULATORY_CARE_PROVIDER_SITE_OTHER): Payer: Medicare Other | Admitting: Internal Medicine

## 2018-05-11 DIAGNOSIS — J8410 Pulmonary fibrosis, unspecified: Secondary | ICD-10-CM | POA: Diagnosis not present

## 2018-05-11 LAB — PULMONARY FUNCTION TEST
DL/VA % PRED: 77 %
DL/VA: 3.58 ml/min/mmHg/L
DLCO UNC: 14.48 ml/min/mmHg
DLCO unc % pred: 43 %
FEF 25-75 PRE: 3.66 L/s
FEF 25-75 Post: 3.89 L/sec
FEF2575-%CHANGE-POST: 6 %
FEF2575-%PRED-POST: 188 %
FEF2575-%Pred-Pre: 177 %
FEV1-%Change-Post: -1 %
FEV1-%PRED-POST: 92 %
FEV1-%PRED-PRE: 93 %
FEV1-PRE: 2.77 L
FEV1-Post: 2.73 L
FEV1FVC-%Change-Post: 2 %
FEV1FVC-%PRED-PRE: 120 %
FEV6-%Change-Post: -3 %
FEV6-%Pred-Post: 79 %
FEV6-%Pred-Pre: 82 %
FEV6-POST: 3.07 L
FEV6-Pre: 3.18 L
FEV6FVC-%Change-Post: 0 %
FEV6FVC-%Pred-Post: 107 %
FEV6FVC-%Pred-Pre: 107 %
FVC-%Change-Post: -3 %
FVC-%Pred-Post: 74 %
FVC-%Pred-Pre: 76 %
FVC-Post: 3.07 L
FVC-Pre: 3.18 L
POST FEV1/FVC RATIO: 89 %
PRE FEV1/FVC RATIO: 87 %
PRE FEV6/FVC RATIO: 100 %
Post FEV6/FVC ratio: 100 %
RV % PRED: 29 %
RV: 0.78 L
TLC % pred: 53 %
TLC: 3.86 L

## 2018-05-11 NOTE — Progress Notes (Signed)
PFT done today. 

## 2018-05-12 ENCOUNTER — Encounter: Payer: Self-pay | Admitting: Internal Medicine

## 2018-05-12 ENCOUNTER — Ambulatory Visit (INDEPENDENT_AMBULATORY_CARE_PROVIDER_SITE_OTHER): Payer: Medicare Other | Admitting: Internal Medicine

## 2018-05-12 VITALS — BP 138/78 | HR 79 | Ht 70.5 in | Wt 201.0 lb

## 2018-05-12 DIAGNOSIS — J8410 Pulmonary fibrosis, unspecified: Secondary | ICD-10-CM | POA: Diagnosis not present

## 2018-05-12 MED ORDER — PREDNISONE 10 MG PO TABS: ORAL_TABLET | ORAL | 1 refills | Status: DC

## 2018-05-12 NOTE — Progress Notes (Signed)
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 Pulmonary Consult    Referred by Dr. Unice Cobble. Pt states having SOB since Dec 2015 after developing URI. He gets SOB with doing yard work and with singing. He states that he also has some PND and occ cough with white to clear sputum.   abruptly ill week before xmas, sense of acute sinus congestion, sore throat chest burning during severe coughing fits assoc with infected tooth rx with clindamycin x 40 days and tooth better, all symptoms improved except now doe x flight of steps or yardwork or fast walking. rec Change prilosec to omeprazole 40 mg Take 30-60 min before first meal of the day and Pepcid ac 20 at bedtime until return GERD diet   12/27/2015  f/u ov/Wert re: PF/ no change ex tol Chief Complaint  Patient presents with  . Follow-up    CXR done today. Breathing is unchanged. No new co's today.   rec No change rx = ppi qam/ h2hs    06/25/2016  f/u ov/Wert re: PF/ maint on prilosec but worried about press so stopped pepcid Chief Complaint  Patient presents with  . Follow-up    PFT done today. Breathing is overall doing well.    able to work in the yard ok, some trouble bending over at waist, no limiting doe rec Continue prilosec otc 60m  Take 30-60 min before first meal of the day and Pepcid ac (famotidine) 20 mg one @  bedtime       12/26/2016  f/u ov/Wert re: PF/ no longer on PPI due to concern with B12 absorption Chief Complaint  Patient presents with  . Follow-up    Breathing has been  worse with colder weather. He is not coughing much, but does have some PND.    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 Follow-up    HRCT done 03/26/18. Breathing is about the same, maybe some worse.   Dyspnea:  yardwork only, some worse with ex of heat/ cold / not monitoring sats with ex  Cough: minimal  Sleep: ok rec Resume pepcid 2031ms   04/26/2018  f/u ov/Wert re:  uip / back on pepcid 20 mg hs Chief Complaint  Patient presents with  . Follow-up    heat and cold affect breathing, SOB with exertion, wheezing and rattling in chest especially when lying down, non-productive cough occassionaly  Dyspnea:  Mostly with yardwork/ bending over   SABA use:  None Cough: some flare with 'head cold" x one  week with subj wheeze/ non producitive worse at hs  rec Please remember to go to the lab department downstairs in the basement  for your tests - we will call you with the results when they are available. For cough try delsym 2 tsp every 12 hours as needed - note sats no worse with ex now than 12/2016   05/12/2018  f/u ov/Wert re:  PF with high ESR  Chief Complaint  Patient presents with  . Follow-up    follow up PFT. breathing unchanged.  Dyspnea:  Yard work / house work all fine Cough: worse than usual since winter 2019 day > noct and worse with ex/ deep breath  Sleep: ok one pillow  No obvious day to day or daytime variability or assoc excess/ purulent sputum or mucus plugs or hemoptysis or cp or chest tightness, subjective wheeze or overt sinus or hb symptoms. No unusual exposure hx or h/o childhood pna/ asthma or knowledge of premature  birth.  Sleeping  Fine one pillow   without nocturnal  or early am exacerbation  of respiratory  c/o's or need for noct saba. Also denies any obvious fluctuation of symptoms with weather or environmental changes or other aggravating or alleviating factors except as outlined above   Current Allergies, Complete Past Medical History, Past Surgical History, Family History, and Social History were reviewed in [employer] record.  ROS  The following are not active complaints unless bolded Hoarseness, sore throat, dysphagia, dental problems, itching, sneezing,  nasal congestion or discharge of excess mucus or purulent secretions, ear ache,   fever, chills, sweats, unintended wt loss or wt gain, classically pleuritic or exertional cp,  orthopnea pnd or arm/hand swelling  or leg swelling, presyncope, palpitations, abdominal pain, anorexia, nausea, vomiting, diarrhea  or change in bowel habits or change in bladder habits, change in stools or change in urine, dysuria, hematuria,  rash, arthralgias, visual complaints, headache, numbness, weakness or ataxia or problems with walking or coordination,  change in mood or  memory.        Current Meds  Medication Sig  . acetaminophen (TYLENOL) 500 MG tablet Take 500 mg by mouth every 6 (six) hours as needed.  Marland Kitchen aspirin 81 MG tablet Take 81 mg by mouth daily.    Marland Kitchen atenolol (TENORMIN) 25 MG tablet TAKE 1/2 TABLET BY MOUTH EVERY MORNING.  . Chlorpheniramine-Acetaminophen (CORICIDIN HBP COLD/FLU PO) Take by mouth.  . famotidine (PEPCID) 20 MG tablet One at bedtime  . fenofibrate (TRICOR) 145 MG tablet TAKE ONE TABLET BY MOUTH ONCE DAILY.  Marland Kitchen glimepiride (AMARYL) 2 MG tablet TAKE 1/2 TABLET BY MOUTH ONCE DAILY FOR DIABETES.  Marland Kitchen losartan (COZAAR) 100 MG tablet TAKE (1) TABLET BY MOUTH ONCE DAILY.  . Multiple Vitamins-Minerals (VITEYES AREDS ADVANCED PO) Take by mouth.  . naproxen sodium (ALEVE) 220 MG tablet Take 220 mg by mouth.  Marland Kitchen omeprazole  (PRILOSEC) 20 MG capsule TAKE ONE CAPSULE BY MOUTH DAILY.            Objective:   Physical Exam  Very pleaseant well pamb wm nad   05/16/2015        209 > 06/28/2015    213  > 12/27/2015   211 > 06/25/2016  206 > 12/26/2016  203> 06/26/2017  211 >   03/29/2018 199 >  04/26/2018  201 > 05/12/2018    201     02/26/15 213 lb (96.616 kg)  02/12/15 213 lb 12 oz (96.956 kg)  Vital signs reviewed - Note on arrival 02 sats  94% on RA        h insp crackles on both bases,  cough on insp   Maneuvers  HEENT: nl dentition, turbinates bilaterally, and oropharynx. Nl external ear canals without cough reflex   NECK :  without JVD/Nodes/TM/ nl carotid upstrokes bilaterally   LUNGS: no acc muscle use,  Nl contour chest insp crackles in bases bilaterally with cough on insp  maneuvers   CV:  RRR  no s3 or murmur or increase in P2, and no edema   ABD:  soft and nontender with nl inspiratory excursion in the supine position. No bruits or organomegaly appreciated, bowel sounds nl  MS:  Nl gait/ ext warm without deformities, calf tenderness, cyanosis  - no def clubbing No obvious joint restrictions   SKIN: warm and dry without lesions    NEURO:  alert, approp, nl sensorium with  no motor or cerebellar deficits apparent.        Lab Results  Component Value Date   ESRSEDRATE 49 (H) 04/26/2018   ESRSEDRATE 31 (H) 06/26/2017   ESRSEDRATE 23 (H) 12/26/2016            Assessment & Plan:

## 2018-05-12 NOTE — Patient Instructions (Signed)
Prednisone 10 mg  2 daily until better then 1 daily x one week and off   We will apply for OFEV initiation and we need to see you one month after you start the medication or 6 weeks

## 2018-05-13 ENCOUNTER — Encounter: Payer: Self-pay | Admitting: Internal Medicine

## 2018-05-13 NOTE — Assessment & Plan Note (Signed)
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 -Smoking history encompassed less than 5 years. He's had no significant occupational exposures - 02/26/2015  Walked RA x 3 laps @ 185 ft each stopped due to  End of study , nl pace, no desats - 02/26/2015 collagen vasc screen sent > neg with esr 28  - 02/26/2015 rec max gerd rx/ diet and > improved 05/16/15  - 06/28/2015  VC 3.65 (85%) no obst and dlco 56 corrects  to 81  - 06/28/2015  Walked RA x 3 laps @ 185 ft each stopped due to End of study, nl pace, no sob or desat  - PFT's  06/25/2016   VC  3.85  (91%)  DLCO  53/51 % corrects to 72 % for alv volume  - 12/26/2016  Walked RA x 3 laps @ 185 ft each stopped due to  End of study, nl pace, no sob /sats 91% at end   - PFTs  06/26/2017   VC  3.33 (79%)   DLCO  62/59  %  Corrects to 93%    - 12/28/2017  6 m walk  =  336 sats ok   - HRCT chest 03/25/18 :  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 - 04/26/2018  Walked RA x 3 laps @ 185 ft each stopped due to  End of study, nl pace, sat 92% at end, min sob    - PFT's  05/12/2018  VC 3.08 (74%)  w obst DLCO  43 % corrects to 77  % for alv volume   - ESR 49  04/26/17  So 05/12/2018 rec pred 20 mg daily until cough better then 10 mg x one week and stop - 05/12/2018 initiate ofev paperwork   The high esr is not typical of pf and may indicate a steroid responsive inflammatory component so will start relatively low doses of pred while waiting for ofev to arrive which may partially stabilize the PF component's progression  but not likely to reverse it whereas pred may reverse the inflammatory component in the meantime.  Discussed in detail all the  indications, usual  risks and alternatives  relative to the benefits with patient who agrees to proceed with treatment plan as outlined     I had an extended  discussion with the patient reviewing all relevant studies completed to date and  lasting 15 to 20 minutes of a 25 minute visit    Each maintenance medication was reviewed in detail including most importantly the difference between maintenance and prns and under what circumstances the prns are to be triggered using an action plan format that is not reflected in the computer generated alphabetically organized AVS.    Please see AVS for specific instructions unique to this visit that I personally wrote and verbalized to the the pt in detail and then reviewed with pt  by my nurse highlighting any  changes in therapy recommended at today's visit to their plan of care.

## 2018-05-18 ENCOUNTER — Other Ambulatory Visit: Payer: Self-pay | Admitting: Internal Medicine

## 2018-05-18 ENCOUNTER — Ambulatory Visit: Payer: Medicare Other | Admitting: Internal Medicine

## 2018-06-07 ENCOUNTER — Ambulatory Visit: Payer: Medicare Other | Admitting: Internal Medicine

## 2018-06-22 ENCOUNTER — Other Ambulatory Visit: Payer: Self-pay | Admitting: Internal Medicine

## 2018-06-28 ENCOUNTER — Ambulatory Visit: Payer: Medicare Other | Admitting: Internal Medicine

## 2018-06-28 ENCOUNTER — Encounter: Payer: Self-pay | Admitting: Internal Medicine

## 2018-06-28 ENCOUNTER — Other Ambulatory Visit (INDEPENDENT_AMBULATORY_CARE_PROVIDER_SITE_OTHER): Payer: Medicare Other

## 2018-06-28 VITALS — BP 138/80 | HR 89 | Ht 70.5 in | Wt 201.0 lb

## 2018-06-28 DIAGNOSIS — J8410 Pulmonary fibrosis, unspecified: Secondary | ICD-10-CM

## 2018-06-28 LAB — CBC WITH DIFFERENTIAL/PLATELET
BASOS ABS: 0.1 10*3/uL (ref 0.0–0.1)
BASOS PCT: 1.3 % (ref 0.0–3.0)
EOS ABS: 0.3 10*3/uL (ref 0.0–0.7)
Eosinophils Relative: 4.4 % (ref 0.0–5.0)
HEMATOCRIT: 41.1 % (ref 39.0–52.0)
Hemoglobin: 13.9 g/dL (ref 13.0–17.0)
LYMPHS ABS: 2.3 10*3/uL (ref 0.7–4.0)
LYMPHS PCT: 33.5 % (ref 12.0–46.0)
MCHC: 33.8 g/dL (ref 30.0–36.0)
MCV: 89.4 fl (ref 78.0–100.0)
MONO ABS: 0.5 10*3/uL (ref 0.1–1.0)
Monocytes Relative: 7.2 % (ref 3.0–12.0)
NEUTROS ABS: 3.7 10*3/uL (ref 1.4–7.7)
NEUTROS PCT: 53.6 % (ref 43.0–77.0)
PLATELETS: 272 10*3/uL (ref 150.0–400.0)
RBC: 4.59 Mil/uL (ref 4.22–5.81)
RDW: 14 % (ref 11.5–15.5)
WBC: 6.9 10*3/uL (ref 4.0–10.5)

## 2018-06-28 LAB — SEDIMENTATION RATE: Sed Rate: 66 mm/hr — ABNORMAL HIGH (ref 0–20)

## 2018-06-28 NOTE — Progress Notes (Signed)
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 Pulmonary Consult    Referred by Dr. Unice Cobble. Pt states having SOB since Dec 2015 after developing URI. He gets SOB with doing yard work and with singing. He states that he also has some PND and occ cough with white to clear sputum.   abruptly ill week before xmas, sense of acute sinus congestion, sore throat chest burning during severe coughing fits assoc with infected tooth rx with clindamycin x 40 days and tooth better, all symptoms improved except now doe x flight of steps or yardwork or fast walking. rec Change prilosec to omeprazole 40 mg Take 30-60 min before first meal of the day and Pepcid ac 20 at bedtime until return GERD diet   12/27/2015  f/u ov/Nathan Howard re: PF/ no change ex tol Chief Complaint  Patient presents with  . Follow-up    CXR done today. Breathing is unchanged. No new co's today.   rec No change rx = ppi qam/ h2hs    06/25/2016  f/u ov/Nathan Howard re: PF/ maint on prilosec but worried about press so stopped pepcid Chief Complaint  Patient presents with  . Follow-up    PFT done today. Breathing is overall doing well.    able to work in the yard ok, some trouble bending over at waist, no limiting doe rec Continue prilosec otc 60m  Take 30-60 min before first meal of the day and Pepcid ac (famotidine) 20 mg one @  bedtime       12/26/2016  f/u ov/Nathan Howard re: PF/ no longer on PPI due to concern with B12 absorption Chief Complaint  Patient presents with  . Follow-up    Breathing has been  worse with colder weather. He is not coughing much, but does have some PND.    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 Follow-up    HRCT done 03/26/18. Breathing is about the same, maybe some worse.   Dyspnea:  yardwork only, some worse with ex of heat/ cold / not monitoring sats with ex  Cough: minimal  Sleep: ok rec Resume pepcid 2031ms   04/26/2018  f/u ov/Nathan Howard re:  uip / back on pepcid 20 mg hs Chief Complaint  Patient presents with  . Follow-up    heat and cold affect breathing, SOB with exertion, wheezing and rattling in chest especially when lying down, non-productive cough occassionaly  Dyspnea:  Mostly with yardwork/ bending over   SABA use:  None Cough: some flare with 'head cold" x one  week with subj wheeze/ non producitive worse at hs  rec Please remember to go to the lab department downstairs in the basement  for your tests - we will call you with the results when they are available. For cough try delsym 2 tsp every 12 hours as needed - note sats no worse with ex now than 12/2016   05/12/2018  f/u ov/Nathan Howard re:  PF with high ESR  Chief Complaint  Patient presents with  . Follow-up    follow up PFT. breathing unchanged.  Dyspnea:  Yard work / house work all fine Cough: worse than usual since winter 2019 day > noct and worse with ex/ deep breath  Sleep: ok one pillow  rec Prednisone 10 mg  2 daily until better then 1 daily x one week and off> never took it We will apply for OFEV initiation and we need to see you one month after you start the medication or 6 weeks    06/28/2018  f/u ov/Nathan Howard re: pf Chief Complaint    Patient presents with  . Follow-up    Breathing has improved some since the last visit. He has not started on prednisone yet.     Dyspnea:  Worse bending over picking up debris on ground Cough: rarely /still using cough drops    SABA use: none 02: none    No obvious day to day or daytime variability or assoc excess/ purulent sputum or mucus plugs or hemoptysis or cp or chest tightness, subjective wheeze or overt sinus or hb symptoms.   Sleeping nearly flat  without nocturnal  or early am exacerbation  of respiratory  c/o's or need for noct saba. Also denies any obvious fluctuation of symptoms with weather or environmental changes or other aggravating or alleviating factors except as outlined above   No unusual exposure hx or h/o childhood pna/ asthma or knowledge of premature birth.  Current Allergies, Complete Past Medical History, Past Surgical History, Family History, and Social History were reviewed in [employer] [facility] record.  ROS  The following are not active complaints unless bolded Hoarseness, sore throat, dysphagia, dental problems, itching, sneezing,  nasal congestion or discharge of excess mucus or purulent secretions, ear ache,   fever, chills, sweats, unintended wt loss or wt gain, classically pleuritic or exertional cp,  orthopnea pnd or arm/hand swelling  or leg swelling, presyncope, palpitations, abdominal pain, anorexia, nausea, vomiting, diarrhea  or change in bowel habits or change in bladder habits, change in stools or change in urine, dysuria, hematuria,  rash, arthralgias, visual complaints, headache, numbness, weakness or ataxia or problems with walking or coordination,  change in mood or  memory.        Current Meds  Medication Sig  . acetaminophen (TYLENOL) 500 MG tablet Take 500 mg by mouth every 6 (six) hours as needed.  Marland Kitchen aspirin 81 MG tablet Take 81 mg by mouth daily.    Marland Kitchen atenolol (TENORMIN) 25 MG tablet TAKE 1/2 TABLET BY MOUTH EVERY  MORNING.  . Chlorpheniramine-Acetaminophen (CORICIDIN HBP COLD/FLU PO) Take by mouth.  . famotidine (PEPCID) 20 MG tablet One at bedtime  . fenofibrate (TRICOR) 145 MG tablet TAKE ONE TABLET BY MOUTH ONCE DAILY.  Marland Kitchen glimepiride (AMARYL) 2 MG tablet TAKE 1/2 TABLET BY MOUTH ONCE DAILY FOR DIABETES.  Marland Kitchen losartan (COZAAR) 100 MG tablet TAKE (1) TABLET BY MOUTH ONCE DAILY.  . Multiple Vitamins-Minerals (VITEYES AREDS ADVANCED PO) Take by mouth.  . naproxen sodium (ALEVE) 220 MG tablet Take  220 mg by mouth.  Marland Kitchen omeprazole (PRILOSEC) 20 MG capsule TAKE ONE CAPSULE BY MOUTH DAILY.               Objective:   Physical Exam  amb wm nad   05/16/2015        209 > 06/28/2015    213  > 12/27/2015   211 > 06/25/2016  206 > 12/26/2016  203> 06/26/2017  211 >   03/29/2018 199 >  04/26/2018  201 > 05/12/2018    201 > 06/28/2018   201     02/26/15 213 lb (96.616 kg)  02/12/15 213 lb 12 oz (96.956 kg)    Vital signs reviewed - Note on arrival 02 sats  92% on RA         HEENT: nl dentition, turbinates bilaterally, and oropharynx. Nl external ear canals without cough reflex   NECK :  without JVD/Nodes/TM/ nl carotid upstrokes bilaterally   LUNGS: no acc muscle use,  Nl contour chest with faint insp crackles  bilaterally without cough on insp or exp maneuvers   CV:  RRR  no s3 or murmur or increase in P2, and no edema   ABD:  soft and nontender with nl inspiratory excursion in the supine position. No bruits or organomegaly appreciated, bowel sounds nl  MS:  Nl gait/ ext warm without deformities, calf tenderness, cyanosis- Noclubbing No obvious joint restrictions   SKIN: warm and dry without lesions    NEURO:  alert, approp, nl sensorium with  no motor or cerebellar deficits apparent.        Lab Results  Component Value Date   ESRSEDRATE 66 (H) 06/28/2018   ESRSEDRATE 49 (H) 04/26/2018   ESRSEDRATE 31 (H) 06/26/2017            Assessment & Plan:

## 2018-06-28 NOTE — Patient Instructions (Addendum)
Continue Try prilosec otc '20mg'$   Take 30-60 min before first meal of the day and Pepcid ac (famotidine) 20 mg one @  bedtime until return   GERD (REFLUX)  is an extremely common cause of respiratory symptoms just like yours , many times with no obvious heartburn at all.    It can be treated with medication, but also with lifestyle changes including elevation of the head of your bed (ideally with 6 inch  bed blocks),  Smoking cessation, avoidance of late meals, excessive alcohol, and avoid fatty foods, chocolate, peppermint, colas, red wine, and acidic juices such as orange juice.  NO MINT OR MENTHOL PRODUCTS SO NO COUGH DROPS   USE SUGARLESS CANDY INSTEAD (Jolley ranchers or Stover's or Life Savers) or even ice chips will also do - the key is to swallow to prevent all throat clearing. NO OIL BASED VITAMINS - use powdered substitutes.    Please remember to go to the lab department downstairs in the basement  for your tests - we will call you with the results when they are available.  Please schedule a follow up visit in 2 months but call sooner if needed with pfts on return    - add: esr now double baseline to 66 so rec pred 20 mg ceiling and floor of 0

## 2018-06-29 ENCOUNTER — Encounter: Payer: Self-pay | Admitting: Internal Medicine

## 2018-06-29 NOTE — Progress Notes (Signed)
LMTCB

## 2018-06-29 NOTE — Assessment & Plan Note (Signed)
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 -Smoking history encompassed less than 5 years. He's had no significant occupational exposures - 02/26/2015  Walked RA x 3 laps @ 185 ft each stopped due to  End of study , nl pace, no desats - 02/26/2015 collagen vasc screen sent > neg with esr 28  - 02/26/2015 rec max gerd rx/ diet and > improved 05/16/15  - 06/28/2015  VC 3.65 (85%) no obst and dlco 56 corrects  to 81  - 06/28/2015  Walked RA x 3 laps @ 185 ft each stopped due to End of study, nl pace, no sob or desat  - PFT's  06/25/2016   VC  3.85  (91%)  DLCO  53/51 % corrects to 72 % for alv volume  - 12/26/2016  Walked RA x 3 laps @ 185 ft each stopped due to  End of study, nl pace, no sob /sats 91% at end   - PFTs  06/26/2017   VC  3.33 (79%)   DLCO  62/59  %  Corrects to 93%    - 12/28/2017  6 m walk  =  336 sats ok   - HRCT chest 03/25/18 :  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 - 04/26/2018  Walked RA x 3 laps @ 185 ft each stopped due to  End of study, nl pace, sat 92% at end, min sob  - PFT's  05/12/2018  VC 3.08 (74%)  w obst DLCO  43 % corrects to 77  % for alv volume   - ESR 49  04/26/17 so 05/12/2018  rec pred 20 mg daily until cough better then 10 mg x one week and stop > never took it  - 05/12/2018 initiate ofev paperwork > declined   - 06/28/2018  Walked RA x 3 laps @ 185 ft each stopped due to  End of study, nl pace, no sob but  desat  To as low as 88%   - ESR 06/28/2018 up to 66 rec pred 20 mg per day until breathing better then taper off   I had an extended discussion with the patient reviewing all relevant studies completed to date and  lasting 15 to 20 minutes of a 25 minute visit on the following ongoing concerns:   Discussed in detail all the  indications, usual  risks and alternatives  relative to the benefits with patient  who declined again to consider antifibrotic rx but did agree to short term prednisone only  based on declining lung function and rising ESR   F/u in 2 months with fresh set of pfts   Each maintenance medication was reviewed in detail including most importantly the difference between maintenance and as needed and under what circumstances the prns are to be used.  Please see AVS for specific  Instructions which are unique to this visit and I personally typed out  which were reviewed in detail in writing with the patient and a copy provided.

## 2018-06-30 ENCOUNTER — Telehealth: Payer: Self-pay | Admitting: Internal Medicine

## 2018-06-30 NOTE — Telephone Encounter (Signed)
Patient is aware of results and verbalized understanding. Nothing further needed.  

## 2018-07-13 ENCOUNTER — Other Ambulatory Visit (INDEPENDENT_AMBULATORY_CARE_PROVIDER_SITE_OTHER): Payer: Medicare Other

## 2018-07-13 ENCOUNTER — Ambulatory Visit: Payer: Medicare Other | Admitting: Internal Medicine

## 2018-07-13 ENCOUNTER — Encounter: Payer: Self-pay | Admitting: Internal Medicine

## 2018-07-13 VITALS — BP 134/70 | HR 65 | Temp 98.0°F | Resp 16 | Ht 70.5 in | Wt 202.0 lb

## 2018-07-13 DIAGNOSIS — E1165 Type 2 diabetes mellitus with hyperglycemia: Secondary | ICD-10-CM | POA: Diagnosis not present

## 2018-07-13 DIAGNOSIS — I10 Essential (primary) hypertension: Secondary | ICD-10-CM

## 2018-07-13 DIAGNOSIS — N401 Enlarged prostate with lower urinary tract symptoms: Secondary | ICD-10-CM

## 2018-07-13 DIAGNOSIS — R7989 Other specified abnormal findings of blood chemistry: Secondary | ICD-10-CM | POA: Diagnosis not present

## 2018-07-13 DIAGNOSIS — E1129 Type 2 diabetes mellitus with other diabetic kidney complication: Secondary | ICD-10-CM | POA: Diagnosis not present

## 2018-07-13 DIAGNOSIS — R351 Nocturia: Secondary | ICD-10-CM

## 2018-07-13 DIAGNOSIS — IMO0002 Reserved for concepts with insufficient information to code with codable children: Secondary | ICD-10-CM

## 2018-07-13 LAB — BASIC METABOLIC PANEL
BUN: 31 mg/dL — ABNORMAL HIGH (ref 6–23)
CO2: 26 mEq/L (ref 19–32)
Calcium: 9.4 mg/dL (ref 8.4–10.5)
Chloride: 102 mEq/L (ref 96–112)
Creatinine, Ser: 1.68 mg/dL — ABNORMAL HIGH (ref 0.40–1.50)
GFR: 42.06 mL/min — AB (ref >60.00)
Glucose, Bld: 237 mg/dL — ABNORMAL HIGH (ref 70–99)
POTASSIUM: 4.1 meq/L (ref 3.5–5.1)
SODIUM: 136 meq/L (ref 135–145)

## 2018-07-13 LAB — HEMOGLOBIN A1C: HEMOGLOBIN A1C: 7.6 % — AB (ref 4.6–6.5)

## 2018-07-13 LAB — TSH: TSH: 3.95 u[IU]/mL (ref 0.35–4.50)

## 2018-07-13 MED ORDER — SITAGLIP PHOS-METFORMIN HCL ER 100-1000 MG PO TB24: 1.0000 | ORAL_TABLET | Freq: Every day | ORAL | 1 refills | Status: DC

## 2018-07-13 NOTE — Patient Instructions (Signed)

## 2018-07-13 NOTE — Progress Notes (Signed)
Subjective:  Patient ID: GEE HABIG, male    DOB: 01-06-1941  Age: 77 y.o. MRN: [id number]  CC: Hypertension and Diabetes   HPI Nathan Howard presents for f/up - He complains that his blood sugars have not been well controlled.  He is concerned that his current medication causes an increased appetite and interferes with his ability to lose weight.  He denies any recent episodes of polyuria or polydipsia but he does suffer from polyphagia.  He tells me his blood pressure has been well controlled and he denies any recent episodes of CP, DOE, palpitations, edema, or fatigue.  Outpatient Medications Prior to Visit  Medication Sig Dispense Refill  . acetaminophen (TYLENOL) 500 MG tablet Take 500 mg by mouth every 6 (six) hours as needed.    Marland Kitchen aspirin 81 MG tablet Take 81 mg by mouth daily.      Marland Kitchen atenolol (TENORMIN) 25 MG tablet TAKE 1/2 TABLET BY MOUTH EVERY MORNING. 45 tablet 0  . Chlorpheniramine-Acetaminophen (CORICIDIN HBP COLD/FLU PO) Take by mouth.    . famotidine (PEPCID) 20 MG tablet One at bedtime    . fenofibrate (TRICOR) 145 MG tablet TAKE ONE TABLET BY MOUTH ONCE DAILY. 90 tablet 0  . losartan (COZAAR) 100 MG tablet TAKE (1) TABLET BY MOUTH ONCE DAILY. 90 tablet 0  . Multiple Vitamins-Minerals (VITEYES AREDS ADVANCED PO) Take by mouth.    Marland Kitchen omeprazole (PRILOSEC) 20 MG capsule TAKE ONE CAPSULE BY MOUTH DAILY. 90 capsule 1  . predniSONE (DELTASONE) 10 MG tablet Take    2 each am  With breakfast then 1 daily x one week and stop 60 tablet 1  . glimepiride (AMARYL) 2 MG tablet TAKE 1/2 TABLET BY MOUTH ONCE DAILY FOR DIABETES. 45 tablet 1  . naproxen sodium (ALEVE) 220 MG tablet Take 220 mg by mouth.     No facility-administered medications prior to visit.     ROS Review of Systems  Constitutional: Negative for diaphoresis and fatigue.  HENT: Negative.   Eyes: Negative for pain and visual disturbance.  Respiratory: Negative for cough, chest tightness, shortness of  breath and wheezing.   Cardiovascular: Negative for chest pain, palpitations and leg swelling.  Gastrointestinal: Negative for abdominal pain, diarrhea, nausea and vomiting.  Endocrine: Positive for polyphagia. Negative for cold intolerance, heat intolerance, polydipsia and polyuria.  Genitourinary: Negative.  Negative for difficulty urinating.  Musculoskeletal: Negative.  Negative for arthralgias and myalgias.  Skin: Negative.  Negative for color change.  Neurological: Negative.  Negative for dizziness, weakness and light-headedness.  Hematological: Negative for adenopathy. Does not bruise/bleed easily.  Psychiatric/Behavioral: Negative.     Objective:  BP 134/70 (BP Location: Left Arm, Patient Position: Sitting, Cuff Size: Normal)   Pulse 65   Temp 98 F (36.7 C) (Oral)   Resp 16   Ht 5' 10.5" (1.791 m)   Wt 202 lb (91.6 kg)   SpO2 94%   BMI 28.57 kg/m   BP Readings from Last 3 Encounters:  07/13/18 134/70  06/28/18 138/80  05/12/18 138/78    Wt Readings from Last 3 Encounters:  07/13/18 202 lb (91.6 kg)  06/28/18 201 lb (91.2 kg)  05/12/18 201 lb (91.2 kg)    Physical Exam  Constitutional: He is oriented to person, place, and time. No distress.  HENT:  Mouth/Throat: Oropharynx is clear and moist. No oropharyngeal exudate.  Eyes: Conjunctivae are normal. No scleral icterus.  Neck: Normal range of motion. Neck supple. No JVD present. No thyromegaly  present.  Cardiovascular: Normal rate, regular rhythm and normal heart sounds.  No murmur heard. Pulmonary/Chest: Effort normal. No tachypnea. He has no decreased breath sounds. He has no wheezes. He has no rhonchi. He has rales in the left lower field.  Abdominal: Soft. Normal appearance and bowel sounds are normal. He exhibits no mass. There is no hepatosplenomegaly. There is no tenderness. No hernia.  Musculoskeletal: Normal range of motion. He exhibits no edema, tenderness or deformity.  Lymphadenopathy:    He has no  cervical adenopathy.  Neurological: He is alert and oriented to person, place, and time.  Skin: Skin is warm and dry. No rash noted. He is not diaphoretic.  Vitals reviewed.   Lab Results  Component Value Date   WBC 6.9 06/28/2018   HGB 13.9 06/28/2018   HCT 41.1 06/28/2018   PLT 272.0 06/28/2018   GLUCOSE 237 (H) 07/13/2018   CHOL 163 11/17/2017   TRIG 211.0 (H) 11/17/2017   HDL 29.30 (L) 11/17/2017   LDLDIRECT 108.0 11/17/2017   LDLCALC 99 11/05/2016   ALT 12 04/26/2018   AST 17 04/26/2018   NA 136 07/13/2018   K 4.1 07/13/2018   CL 102 07/13/2018   CREATININE 1.68 (H) 07/13/2018   BUN 31 (H) 07/13/2018   CO2 26 07/13/2018   TSH 3.95 07/13/2018   PSA 1.35 03/24/2011   HGBA1C 7.6 (H) 07/13/2018   MICROALBUR <0.7 11/17/2017    Ct Chest High Resolution  Result Date: 03/26/2018 CLINICAL DATA:  Worsening dyspnea with exertion. Former smoker. Evaluate for interstitial lung disease. EXAM: CT CHEST WITHOUT CONTRAST TECHNIQUE: Multidetector CT imaging of the chest was performed following the standard protocol without intravenous contrast. High resolution imaging of the lungs, as well as inspiratory and expiratory imaging, was performed. COMPARISON:  01/21/2017 chest radiograph. FINDINGS: Cardiovascular: Normal heart size. No significant pericardial fluid/thickening. Left anterior descending and left circumflex coronary atherosclerosis. Atherosclerotic nonaneurysmal thoracic aorta. Borderline prominent main pulmonary artery (3.3 cm diameter). Mediastinum/Nodes: No discrete thyroid nodules. Unremarkable esophagus. No axillary adenopathy. Mild right paratracheal adenopathy measuring up to 1.1 cm (series 2/image 63). Mildly enlarged 1.2 cm subcarinal node (series 2/image 77). No discrete hilar adenopathy on these noncontrast images. Lungs/Pleura: No pneumothorax. No pleural effusion. No acute consolidative airspace disease, lung masses or significant pulmonary nodules. Mild patchy air trapping  in the upper lungs on the expiration sequence. There is patchy confluent subpleural and peribronchovascular reticulation and ground-glass attenuation in both lungs with associated mild-to-moderate traction bronchiectasis and architectural distortion. There is a basilar gradient to these findings. There is possible early honeycombing in the left lower lobe (series 3/image 104 and series 8/image 81). Upper abdomen: Cholelithiasis. Musculoskeletal: No aggressive appearing focal osseous lesions. Mild thoracic spondylosis. IMPRESSION: 1. 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 Consider follow-up high-resolution chest CT in 6-12 months to assess temporal pattern stability, as clinically warranted. 2. Mild patchy air trapping in the upper lungs, indicative of small airways disease. 3. Nonspecific mild mediastinal lymphadenopathy, probably reactive. 4. Two-vessel coronary atherosclerosis. 5. Borderline dilated main pulmonary artery. 6. Cholelithiasis. Aortic Atherosclerosis (ICD10-I70.0). Electronically Signed   By: Ilona Sorrel M.D.   On: 03/26/2018 10:47    Assessment & Plan:   Nathan Howard was seen today for hypertension and diabetes.  Diagnoses and all orders for this visit:  TSH elevation- His TSH is normal now and he is clinically euthyroid. -     TSH;  Future  Type 2 diabetes, uncontrolled, with renal manifestation ([place])- His A1c is up to 7.6%.  His blood sugars are not adequately well controlled.  I have asked him to stop taking the sulfonylurea and will upgrade him to metformin and a DPP 4 inhibitor. -     Basic metabolic panel; Future -     Hemoglobin A1c; Future -     SitaGLIPtin-MetFORMIN HCl (JANUMET XR) [phone] MG TB24; Take 1 tablet by mouth daily.  Essential hypertension- His blood pressure is adequately well controlled.  Electrolytes and renal  function are normal. -     Basic metabolic panel; Future  BPH associated with nocturia -     Cancel: Ambulatory referral to Urology   I have discontinued Otila Back. Reckner's naproxen sodium and glimepiride. I am also having him start on SitaGLIPtin-MetFORMIN HCl. Additionally, I am having him maintain his aspirin, omeprazole, Chlorpheniramine-Acetaminophen (CORICIDIN HBP COLD/FLU PO), Multiple Vitamins-Minerals (VITEYES AREDS ADVANCED PO), famotidine, atenolol, acetaminophen, predniSONE, fenofibrate, and losartan.  Meds ordered this encounter  Medications  . SitaGLIPtin-MetFORMIN HCl (JANUMET XR) [phone] MG TB24    Sig: Take 1 tablet by mouth daily.    Dispense:  90 tablet    Refill:  1     Follow-up: Return in about 4 months (around 11/13/2018).  Scarlette Calico, MD

## 2018-07-14 ENCOUNTER — Encounter: Payer: Self-pay | Admitting: Internal Medicine

## 2018-07-22 ENCOUNTER — Other Ambulatory Visit: Payer: Self-pay | Admitting: Internal Medicine

## 2018-08-16 ENCOUNTER — Other Ambulatory Visit: Payer: Self-pay | Admitting: Internal Medicine

## 2018-08-30 ENCOUNTER — Ambulatory Visit: Payer: Medicare Other | Admitting: Internal Medicine

## 2018-09-01 ENCOUNTER — Encounter: Payer: Self-pay | Admitting: Internal Medicine

## 2018-09-01 ENCOUNTER — Other Ambulatory Visit (INDEPENDENT_AMBULATORY_CARE_PROVIDER_SITE_OTHER): Payer: Medicare Other

## 2018-09-01 ENCOUNTER — Ambulatory Visit (INDEPENDENT_AMBULATORY_CARE_PROVIDER_SITE_OTHER): Payer: Medicare Other | Admitting: Internal Medicine

## 2018-09-01 ENCOUNTER — Ambulatory Visit: Payer: Medicare Other | Admitting: Internal Medicine

## 2018-09-01 VITALS — BP 124/66 | HR 80 | Ht 70.5 in | Wt 195.0 lb

## 2018-09-01 DIAGNOSIS — J8410 Pulmonary fibrosis, unspecified: Secondary | ICD-10-CM

## 2018-09-01 DIAGNOSIS — J309 Allergic rhinitis, unspecified: Secondary | ICD-10-CM

## 2018-09-01 DIAGNOSIS — Z23 Encounter for immunization: Secondary | ICD-10-CM

## 2018-09-01 LAB — PULMONARY FUNCTION TEST
FEF 25-75 POST: 1.62 L/s
FEF 25-75 Pre: 1.35 L/sec
FEF2575-%CHANGE-POST: 19 %
FEF2575-%PRED-POST: 79 %
FEF2575-%PRED-PRE: 66 %
FEV1-%Change-Post: 2 %
FEV1-%PRED-POST: 68 %
FEV1-%PRED-PRE: 66 %
FEV1-POST: 2.01 L
FEV1-PRE: 1.96 L
FEV1FVC-%CHANGE-POST: 0 %
FEV1FVC-%Pred-Pre: 92 %
FEV6-%CHANGE-POST: 2 %
FEV6-%Pred-Post: 77 %
FEV6-%Pred-Pre: 75 %
FEV6-PRE: 2.93 L
FEV6-Post: 3 L
FEV6FVC-%Pred-Post: 107 %
FEV6FVC-%Pred-Pre: 107 %
FVC-%Change-Post: 2 %
FVC-%Pred-Post: 72 %
FVC-%Pred-Pre: 70 %
FVC-Post: 3 L
FVC-Pre: 2.93 L
POST FEV1/FVC RATIO: 67 %
Post FEV6/FVC ratio: 100 %
Pre FEV1/FVC ratio: 67 %
Pre FEV6/FVC Ratio: 100 %
RV % pred: 38 %
RV: 1.02 L
TLC % pred: 55 %
TLC: 3.98 L

## 2018-09-01 LAB — CBC WITH DIFFERENTIAL/PLATELET
BASOS ABS: 0 10*3/uL (ref 0.0–0.1)
BASOS PCT: 0.7 % (ref 0.0–3.0)
Eosinophils Absolute: 0.3 10*3/uL (ref 0.0–0.7)
Eosinophils Relative: 5.8 % — ABNORMAL HIGH (ref 0.0–5.0)
HCT: 38.4 % — ABNORMAL LOW (ref 39.0–52.0)
Hemoglobin: 12.8 g/dL — ABNORMAL LOW (ref 13.0–17.0)
LYMPHS ABS: 1.8 10*3/uL (ref 0.7–4.0)
Lymphocytes Relative: 31.2 % (ref 12.0–46.0)
MCHC: 33.3 g/dL (ref 30.0–36.0)
MCV: 89.2 fl (ref 78.0–100.0)
MONOS PCT: 6.8 % (ref 3.0–12.0)
Monocytes Absolute: 0.4 10*3/uL (ref 0.1–1.0)
NEUTROS ABS: 3.2 10*3/uL (ref 1.4–7.7)
NEUTROS PCT: 55.5 % (ref 43.0–77.0)
PLATELETS: 256 10*3/uL (ref 150.0–400.0)
RBC: 4.31 Mil/uL (ref 4.22–5.81)
RDW: 14.3 % (ref 11.5–15.5)
WBC: 5.8 10*3/uL (ref 4.0–10.5)

## 2018-09-01 LAB — SEDIMENTATION RATE: Sed Rate: 51 mm/hr — ABNORMAL HIGH (ref 0–20)

## 2018-09-01 NOTE — Progress Notes (Signed)
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 Pulmonary Consult    Referred by Dr. Unice Howard. Pt states having SOB since Dec 2015 after developing URI. He gets SOB with doing yard work and with singing. He states that he also has some PND and occ cough with white to clear sputum.   abruptly ill week before xmas, sense of acute sinus congestion, sore throat chest burning during severe coughing fits assoc with infected tooth rx with clindamycin x 40 days and tooth better, all symptoms improved except now doe x flight of steps or yardwork or fast walking. rec Change prilosec to omeprazole 40 mg Take 30-60 min before first meal of the day and Pepcid ac 20 at bedtime until return GERD diet   12/27/2015  f/u ov/Nathan Howard re: PF/ no change ex tol Chief Complaint  Patient presents with  . Follow-up    CXR done today. Breathing is unchanged. No new co's today.   rec No change rx = ppi qam/ h2hs    06/25/2016  f/u ov/Nathan Howard re: PF/ maint on prilosec but worried about press so stopped pepcid Chief Complaint  Patient presents with  . Follow-up    PFT done today. Breathing is overall doing well.    able to work in the yard ok, some trouble bending over at waist, no limiting doe rec Continue prilosec otc 60m  Take 30-60 min before first meal of the day and Pepcid ac (famotidine) 20 mg one @  bedtime       12/26/2016  f/u ov/Nathan Howard re: PF/ no longer on PPI due to concern with B12 absorption Chief Complaint  Patient presents with  . Follow-up    Breathing has been  worse with colder weather. He is not coughing much, but does have some PND.    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 Follow-up    HRCT done 03/26/18. Breathing is about the same, maybe some worse.   Dyspnea:  yardwork only, some worse with ex of heat/ cold / not monitoring sats with ex  Cough: minimal  Sleep: ok rec Resume pepcid 2031ms   04/26/2018  f/u ov/Nathan Howard re:  uip / back on pepcid 20 mg hs Chief Complaint  Patient presents with  . Follow-up    heat and cold affect breathing, SOB with exertion, wheezing and rattling in chest especially when lying down, non-productive cough occassionaly  Dyspnea:  Mostly with yardwork/ bending over   SABA use:  None Cough: some flare with 'head cold" x one  week with subj wheeze/ non producitive worse at hs  rec Please remember to go to the lab department downstairs in the basement  for your tests - we will call you with the results when they are available. For cough try delsym 2 tsp every 12 hours as needed - note sats no worse with ex now than 12/2016   05/12/2018  f/u ov/Nathan Howard re:  PF with high ESR  Chief Complaint  Patient presents with  . Follow-up    follow up PFT. breathing unchanged.  Dyspnea:  Yard work / house work all fine Cough: worse than usual since winter 2019 day > noct and worse with ex/ deep breath  Sleep: ok one pillow  rec Prednisone 10 mg  2 daily until better then 1 daily x one week and off> never took it We will apply for OFEV initiation  > pt decided against after reviewing info   06/28/2018  f/u ov/Nathan Howard re: PF  Chief Complaint  Patient presents with  . Follow-up      Breathing has improved some since the last visit. He has not started on prednisone yet.     Dyspnea:  Worse bending over picking up debris on ground Cough: rarely /still using cough drops   SABA use: none 02: none  rec Continue Try prilosec otc '20mg'$   Take 30-60 min before first meal of the day and Pepcid ac (famotidine) 20 mg one @  bedtime until return  GERD  Please remember to go to the lab department downstairs in the basement  for your tests - we will call you with the results when they are available. Please schedule a follow up visit in 2 months but call sooner if needed with pfts on return    - add: esr now double baseline to 66 so rec pred 20 mg ceiling and floor of 0   09/01/2018  f/u ov/Nathan Howard re:  ? IPF  Chief Complaint  Patient presents with  . Follow-up    PFT results, feels like he has rattle in his chest, SHOB  Dyspnea:  Yard work Cough: w/in a week of taking pred 20 mg / day cough resolved and taper off by around July 22 2018 s flare  Sleeping: flat bed/ one pillow SABA use: none 02:  None     No obvious day to day or daytime variability or assoc excess/ purulent sputum or mucus plugs or hemoptysis or cp or chest tightness, subjective wheeze or overt sinus or hb symptoms.   Sleeping as above  without nocturnal  or early am exacerbation  of respiratory  c/o's or need for noct saba. Also denies any obvious fluctuation of symptoms with weather or environmental changes or other aggravating or alleviating factors except as outlined above   No unusual exposure hx or h/o childhood pna/ asthma or knowledge of premature birth.  Current Allergies, Complete Past Medical History, Past Surgical History, Family History, and Social History were reviewed in [employer] record.  ROS  The following are not active complaints unless bolded Hoarseness, sore throat, dysphagia, dental problems, itching, sneezing,  nasal congestion or discharge of excess mucus or  purulent secretions, ear ache,   fever, chills, sweats, unintended wt loss or wt gain, classically pleuritic or exertional cp,  orthopnea pnd or arm/hand swelling  or leg swelling, presyncope, palpitations, abdominal pain, anorexia, nausea, vomiting, diarrhea  or change in bowel habits or change in bladder habits, change in stools or change in urine, dysuria, hematuria,  rash, arthralgias, visual complaints, headache, numbness, weakness or ataxia or problems with walking or coordination,  change in mood or  memory.        Current Meds  Medication Sig  . acetaminophen (TYLENOL) 500 MG tablet Take 500 mg by mouth every 6 (six) hours as needed.  Marland Kitchen aspirin 81 MG tablet Take 81 mg by mouth daily.    Marland Kitchen atenolol (TENORMIN) 25 MG tablet Take 0.5 tablets (12.5 mg total) by mouth every morning.  . Chlorpheniramine-Acetaminophen (CORICIDIN HBP COLD/FLU PO) Take by mouth.  . famotidine (PEPCID) 20 MG tablet One at bedtime  . fenofibrate (TRICOR) 145 MG tablet TAKE ONE TABLET BY MOUTH ONCE DAILY.  Marland Kitchen losartan (COZAAR) 100 MG tablet TAKE (1) TABLET BY MOUTH ONCE DAILY.  . Multiple Vitamins-Minerals (VITEYES AREDS ADVANCED PO) Take by mouth.  Marland Kitchen omeprazole (PRILOSEC) 20 MG capsule TAKE ONE CAPSULE BY MOUTH DAILY.  Marland Kitchen SitaGLIPtin-MetFORMIN HCl (JANUMET XR) [phone] MG TB24 Take 1 tablet by mouth daily.  .     .                  Objective:   Physical Exam  amb wm nad   05/16/2015        209 > 06/28/2015    213  > 12/27/2015   211 > 06/25/2016  206 > 12/26/2016  203> 06/26/2017  211 >   03/29/2018 199 >  04/26/2018  201 > 05/12/2018    201 > 06/28/2018   201 > 09/01/2018    195     02/26/15 213 lb (96.616 kg)  02/12/15 213 lb 12 oz (96.956 kg)         HEENT: nl dentition, turbinates bilaterally, and oropharynx. Nl external ear canals without cough reflex   NECK :  without JVD/Nodes/TM/ nl carotid upstrokes bilaterally   LUNGS: no acc muscle use,  Nl contour chest faint insp crackles  bilaterally without cough on  insp or exp maneuvers   CV:  RRR  no s3 or murmur or increase in P2, and no edema   ABD:  soft and nontender with nl inspiratory excursion in the supine position. No bruits or organomegaly appreciated, bowel sounds nl  MS:  Nl gait/ ext warm without deformities, calf tenderness, cyanosis or clubbing No obvious joint restrictions   SKIN: warm and dry without lesions    NEURO:  alert, approp, nl sensorium with  no motor or cerebellar deficits apparent.      Labs ordered 09/01/2018   Lab Results  Component Value Date   ESRSEDRATE 51 (H) 09/01/2018      Lab Results  Component Value Date   ESRSEDRATE 66 (H) 06/28/2018   ESRSEDRATE 49 (H) 04/26/2018   ESRSEDRATE 31 (H) 06/26/2017            Assessment & Plan:

## 2018-09-01 NOTE — Patient Instructions (Addendum)
If breathing/ cough worsen restart prednisone 20 mg daily and when better reduce to 10 mg daily as your new "floor" but be sure to monitor your sugars if you need to restart prednisone    Please remember to go to the lab department downstairs in the basement  for your tests - we will call you with the results when they are available and advise further on whether to restart the prednisone       Please schedule a follow up office visit in 6 weeks, call sooner if needed with 6 min walk

## 2018-09-01 NOTE — Progress Notes (Signed)
Patient completed PFT today. Patient was not able to completed DLCO part of the PFT, tried multiple times with good effort. Patient was unable to take a full deep breathe to pass the qualifications of DLCO.

## 2018-09-02 ENCOUNTER — Encounter: Payer: Self-pay | Admitting: Internal Medicine

## 2018-09-02 LAB — RESPIRATORY ALLERGY PROFILE REGION II ~~LOC~~
Allergen, A. alternata, m6: <0.1 kU/L
Allergen, Cedar tree, t12: <0.1 kU/L
Allergen, Comm Silver Birch, t9: <0.1 kU/L
Allergen, Cottonwood, t14: <0.1 kU/L
Allergen, D pternoyssinus,d7: <0.1 kU/L
Allergen, P. notatum, m1: <0.1 kU/L
CLADOSPORIUM HERBARUM (M2) IGE: <0.1 kU/L
CLASS: 0
CLASS: 0
CLASS: 0
CLASS: 0
CLASS: 0
CLASS: 0
CLASS: 0
CLASS: 0
CLASS: 0
COMMON RAGWEED (SHORT) (W1) IGE: <0.1 kU/L
Class: 0
Class: 0
Class: 0
Class: 0
Class: 0
Class: 0
Class: 0
Class: 0
Class: 0
Class: 0
Class: 0
Class: 0
Class: 0
Class: 0
Class: 0
Dog Dander: <0.1 kU/L
IgE (Immunoglobulin E), Serum: 19 kU/L (ref <=114)
Johnson Grass: <0.1 kU/L
Pecan/Hickory Tree IgE: <0.1 kU/L
Timothy Grass: <0.1 kU/L

## 2018-09-02 LAB — INTERPRETATION:

## 2018-09-02 NOTE — Assessment & Plan Note (Addendum)
Allergy profile 09/01/2018 >  Eos 0.3 /  IgE  Pending   Can use otc zyrtec prn for now

## 2018-09-02 NOTE — Assessment & Plan Note (Addendum)
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 -Smoking history encompassed less than 5 years. He's had no significant occupational exposures - 02/26/2015  Walked RA x 3 laps @ 185 ft each stopped due to  End of study , nl pace, no desats - 02/26/2015 collagen vasc screen sent > neg with esr 28  - 02/26/2015 rec max gerd rx/ diet and > improved 05/16/15  - 06/28/2015  VC 3.65 (85%) no obst and dlco 56 corrects  to 81  - 06/28/2015  Walked RA x 3 laps @ 185 ft each stopped due to End of study, nl pace, no sob or desat  - PFT's  06/25/2016   VC  3.85  (91%)  DLCO  53/51 % corrects to 72 % for alv volume  - 12/26/2016  Walked RA x 3 laps @ 185 ft each stopped due to  End of study, nl pace, no sob /sats 91% at end   - PFTs  06/26/2017   VC  3.33 (79%)   DLCO  62/59  %  Corrects to 93%    - 12/28/2017  6 m walk  =  336 sats ok   - HRCT chest 03/25/18 :  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 - 04/26/2018  Walked RA x 3 laps @ 185 ft each stopped due to  End of study, nl pace, sat 92% at end, min sob  - PFT's  05/12/2018  VC 3.08 (74%)  w obst DLCO  43 % corrects to 77  % for alv volume   - ESR 49  04/26/17 so 05/12/2018  rec pred 20 mg daily until cough better then 10 mg x one week and stop > never took it  - 05/12/2018 initiate ofev paperwork > declined  - 06/28/2018  Walked RA x 3 laps @ 185 ft each stopped due to  End of study, nl pace, no sob but  desat  To as low as 88%   - ESR 06/28/2018 up to 66 rec pred 20 mg per day until breathing better then taper off> cough resolved    - 09/01/2018  Walked RA x 3 laps @ 185 ft each stopped due to  End of study, slow pace, no sob and sats 89% at end   - PFT's  09/01/2018  FVC  2.93 (70%)  p no % improvement from saba p no prior to study / not able to do dlco    - ESR 51 off prednisone x 2 weeks   He does not  have typical IPF clinically and does appear to have an active inflammatory component / steroid resp and delcines starting ofev so reasonable to continue to follow using principle that The goal with a chronic steroid dependent illness is always arriving at the lowest effective dose that controls the disease/symptoms and not accepting a set "formula" which is based on statistics or guidelines that don't always take into account patient  variability or the natural hx of the dz in every individual patient, which may well vary over time.  For now therefore I recommend the patient maintain  Off pred for now, resume 20 mg daily if cough recurs or losing ground on ex tol.    Discussed with the patient and all questioned fully answered. He will call me if any problems arise.    I had an extended discussion with the patient reviewing all relevant studies  completed to date and  lasting 15 to 20 minutes of a 25 minute visit       Each maintenance medication was reviewed in detail including emphasizing most importantly the difference between maintenance and prns and under what circumstances the prns are to be triggered using an action plan format that is not reflected in the computer generated alphabetically organized AVS which I have not found useful in most complex patients, especially with respiratory illnesses  Please see AVS for specific instructions unique to this visit that I personally wrote and verbalized to the the pt in detail and then reviewed with pt  by my nurse highlighting any  changes in therapy recommended at today's visit to their plan of care.

## 2018-09-02 NOTE — Progress Notes (Signed)
Spoke with pt and notified of results per Dr. Wert. Pt verbalized understanding and denied any questions. 

## 2018-10-13 ENCOUNTER — Other Ambulatory Visit: Payer: Self-pay | Admitting: Internal Medicine

## 2018-10-18 ENCOUNTER — Ambulatory Visit: Payer: Medicare Other | Admitting: Internal Medicine

## 2018-10-18 ENCOUNTER — Ambulatory Visit: Payer: Medicare Other

## 2018-11-03 LAB — HM DIABETES EYE EXAM

## 2018-11-05 LAB — HM DIABETES EYE EXAM

## 2018-11-08 ENCOUNTER — Encounter: Payer: Self-pay | Admitting: Internal Medicine

## 2018-11-08 ENCOUNTER — Ambulatory Visit: Payer: Medicare Other | Admitting: Internal Medicine

## 2018-11-08 ENCOUNTER — Other Ambulatory Visit (INDEPENDENT_AMBULATORY_CARE_PROVIDER_SITE_OTHER): Payer: Medicare Other

## 2018-11-08 VITALS — BP 130/72 | HR 80 | Temp 97.8°F | Ht 70.5 in | Wt 192.8 lb

## 2018-11-08 DIAGNOSIS — D539 Nutritional anemia, unspecified: Secondary | ICD-10-CM | POA: Diagnosis not present

## 2018-11-08 DIAGNOSIS — N289 Disorder of kidney and ureter, unspecified: Secondary | ICD-10-CM

## 2018-11-08 DIAGNOSIS — IMO0002 Reserved for concepts with insufficient information to code with codable children: Secondary | ICD-10-CM

## 2018-11-08 DIAGNOSIS — D518 Other vitamin B12 deficiency anemias: Secondary | ICD-10-CM

## 2018-11-08 DIAGNOSIS — E1129 Type 2 diabetes mellitus with other diabetic kidney complication: Secondary | ICD-10-CM

## 2018-11-08 DIAGNOSIS — E785 Hyperlipidemia, unspecified: Secondary | ICD-10-CM

## 2018-11-08 DIAGNOSIS — E1165 Type 2 diabetes mellitus with hyperglycemia: Secondary | ICD-10-CM

## 2018-11-08 DIAGNOSIS — Z0001 Encounter for general adult medical examination with abnormal findings: Secondary | ICD-10-CM | POA: Diagnosis not present

## 2018-11-08 DIAGNOSIS — I10 Essential (primary) hypertension: Secondary | ICD-10-CM

## 2018-11-08 DIAGNOSIS — Z0000 Encounter for general adult medical examination without abnormal findings: Secondary | ICD-10-CM

## 2018-11-08 LAB — MICROALBUMIN / CREATININE URINE RATIO
Creatinine,U: 86.4 mg/dL
MICROALB/CREAT RATIO: 0.8 mg/g (ref 0.0–30.0)
Microalb, Ur: <0.7 mg/dL (ref 0.0–1.9)

## 2018-11-08 LAB — BASIC METABOLIC PANEL
BUN: 19 mg/dL (ref 6–23)
CHLORIDE: 102 meq/L (ref 96–112)
CO2: 26 meq/L (ref 19–32)
Calcium: 10.2 mg/dL (ref 8.4–10.5)
Creatinine, Ser: 1.49 mg/dL (ref 0.40–1.50)
GFR: 48.26 mL/min — ABNORMAL LOW (ref >60.00)
GLUCOSE: 173 mg/dL — AB (ref 70–99)
Potassium: 4.4 mEq/L (ref 3.5–5.1)
Sodium: 136 mEq/L (ref 135–145)

## 2018-11-08 LAB — CBC WITH DIFFERENTIAL/PLATELET
BASOS PCT: 1.1 % (ref 0.0–3.0)
Basophils Absolute: 0.1 10*3/uL (ref 0.0–0.1)
Eosinophils Absolute: 0.3 10*3/uL (ref 0.0–0.7)
Eosinophils Relative: 4.1 % (ref 0.0–5.0)
HCT: 41 % (ref 39.0–52.0)
Hemoglobin: 13.5 g/dL (ref 13.0–17.0)
LYMPHS ABS: 2.3 10*3/uL (ref 0.7–4.0)
Lymphocytes Relative: 30.5 % (ref 12.0–46.0)
MCHC: 33 g/dL (ref 30.0–36.0)
MCV: 90.1 fl (ref 78.0–100.0)
MONO ABS: 0.4 10*3/uL (ref 0.1–1.0)
Monocytes Relative: 5.9 % (ref 3.0–12.0)
NEUTROS ABS: 4.3 10*3/uL (ref 1.4–7.7)
NEUTROS PCT: 58.4 % (ref 43.0–77.0)
Platelets: 271 10*3/uL (ref 150.0–400.0)
RBC: 4.55 Mil/uL (ref 4.22–5.81)
RDW: 14.1 % (ref 11.5–15.5)
WBC: 7.4 10*3/uL (ref 4.0–10.5)

## 2018-11-08 LAB — IBC PANEL
Iron: 100 ug/dL (ref 42–165)
SATURATION RATIOS: 21.3 % (ref 20.0–50.0)
Transferrin: 335 mg/dL (ref 212.0–360.0)

## 2018-11-08 LAB — URINALYSIS, ROUTINE W REFLEX MICROSCOPIC
BILIRUBIN URINE: NEGATIVE
Hgb urine dipstick: NEGATIVE
Ketones, ur: NEGATIVE
Leukocytes, UA: NEGATIVE
Nitrite: NEGATIVE
PH: 7 (ref 5.0–8.0)
Specific Gravity, Urine: 1.01 (ref 1.000–1.030)
Total Protein, Urine: NEGATIVE
URINE GLUCOSE: NEGATIVE
Urobilinogen, UA: 0.2 (ref 0.0–1.0)

## 2018-11-08 LAB — LIPID PANEL
CHOLESTEROL: 162 mg/dL (ref 0–200)
HDL: 31.6 mg/dL — ABNORMAL LOW (ref >39.00)
LDL CALC: 91 mg/dL (ref 0–99)
NonHDL: 130.57
TRIGLYCERIDES: 199 mg/dL — AB (ref 0.0–149.0)
Total CHOL/HDL Ratio: 5
VLDL: 39.8 mg/dL (ref 0.0–40.0)

## 2018-11-08 LAB — HEMOGLOBIN A1C: HEMOGLOBIN A1C: 7.5 % — AB (ref 4.6–6.5)

## 2018-11-08 LAB — VITAMIN B12: Vitamin B-12: 311 pg/mL (ref 211–911)

## 2018-11-08 LAB — TSH: TSH: 3.4 u[IU]/mL (ref 0.35–4.50)

## 2018-11-08 LAB — FOLATE: Folate: 23.5 ng/mL (ref >5.9)

## 2018-11-08 LAB — FERRITIN: FERRITIN: 66.3 ng/mL (ref 22.0–322.0)

## 2018-11-08 MED ORDER — ZOSTER VAC RECOMB ADJUVANTED 50 MCG/0.5ML IM SUSR: 0.5000 mL | Freq: Once | INTRAMUSCULAR | 1 refills | Status: AC

## 2018-11-08 NOTE — Patient Instructions (Signed)

## 2018-11-08 NOTE — Progress Notes (Signed)
Subjective:  Patient ID: Nathan Howard, male    DOB: Feb 17, 1941  Age: 77 y.o. MRN: [id number]  CC: Annual Exam; Hyperlipidemia; and Anemia   HPI Nathan Howard presents for a CPX.  He is here for follow-up on anemia and diabetes.  He is very active and denies any recent episodes of CP, DOE, palpitations, edema, or fatigue.  He tells me he is compliant with his meds and has no side effects.  He is not currently taking a statin.  He does not know why.  Past Medical History:  Diagnosis Date  . Anal fissure   . Anemia   . Arthritis   . Colitis 2011   Dr Sharlett Iles  . Diverticulosis of colon (without mention of hemorrhage)   . Esophageal reflux   . Hemorrhoids   . Hyperlipemia   . Hypertension   . Personal history of colonic polyps 2000   adenomatous polyp Dr Velora Heckler  . Pneumonia   . Pneumonia     X 2  , as child  & 1974  . Renal insufficiency   . Skin cancer    right ear  . Type II or unspecified type diabetes mellitus without mention of complication, not stated as uncontrolled   . Vitamin B12 deficiency    Past Surgical History:  Procedure Laterality Date  . APPENDECTOMY  1953  . arm surgery     benign growth removed left arm  . CATARACT EXTRACTION     OD  . COLONOSCOPY W/ POLYPECTOMY     Dr Sharlett Iles  . [place]   x 2  . SKIN BIOPSY Right 2014   benign  . TONSILLECTOMY  1961  . UPPER GASTROINTESTINAL ENDOSCOPY     gastritis; Dr Sharlett Iles    reports that he quit smoking about 56 years ago. His smoking use included cigarettes. He has a 3.00 pack-year smoking history. He has never used smokeless tobacco. He reports that he does not drink alcohol or use drugs. family history includes Alcohol abuse in his father; Arthritis in his paternal grandfather; Cancer in his maternal aunt, paternal aunt, and paternal uncle; Dementia in his mother; Diabetes in his mother; Heart attack (age of onset: 24) in his father; Heart attack (age of onset: 44) in his  mother; Heart disease in his maternal grandfather; Lung cancer in his maternal grandmother; Stomach cancer in his maternal uncle. No Known Allergies   Outpatient Medications Prior to Visit  Medication Sig Dispense Refill  . acetaminophen (TYLENOL) 500 MG tablet Take 500 mg by mouth every 6 (six) hours as needed.    Marland Kitchen atenolol (TENORMIN) 25 MG tablet Take 0.5 tablets (12.5 mg total) by mouth every morning. 45 tablet 1  . Chlorpheniramine-Acetaminophen (CORICIDIN HBP COLD/FLU PO) Take by mouth.    . famotidine (PEPCID) 20 MG tablet One at bedtime    . fenofibrate (TRICOR) 145 MG tablet TAKE ONE TABLET BY MOUTH ONCE DAILY. 90 tablet 1  . losartan (COZAAR) 100 MG tablet TAKE (1) TABLET BY MOUTH ONCE DAILY. 90 tablet 0  . Multiple Vitamins-Minerals (VITEYES AREDS ADVANCED PO) Take by mouth.    Marland Kitchen omeprazole (PRILOSEC) 20 MG capsule TAKE ONE CAPSULE BY MOUTH DAILY. 90 capsule 2  . SitaGLIPtin-MetFORMIN HCl (JANUMET XR) [phone] MG TB24 Take 1 tablet by mouth daily. 90 tablet 1  . aspirin 81 MG tablet Take 81 mg by mouth daily.      Marland Kitchen atenolol (TENORMIN) 25 MG tablet TAKE 1/2 TABLET BY MOUTH  EVERY MORNING. 45 tablet 0   No facility-administered medications prior to visit.     ROS Review of Systems  Constitutional: Negative.  Negative for appetite change, diaphoresis, fatigue and unexpected weight change.  HENT: Negative.   Eyes: Negative for visual disturbance.  Respiratory: Negative for cough, chest tightness, shortness of breath and wheezing.   Cardiovascular: Negative for chest pain, palpitations and leg swelling.  Gastrointestinal: Negative for abdominal pain, constipation, diarrhea, nausea and vomiting.  Endocrine: Negative.  Negative for polydipsia, polyphagia and polyuria.  Genitourinary: Negative.  Negative for difficulty urinating, penile swelling, scrotal swelling and testicular pain.  Musculoskeletal: Negative.  Negative for arthralgias, back pain and myalgias.  Skin: Negative.   Negative for color change.  Neurological: Negative.  Negative for dizziness, weakness, light-headedness and headaches.  Hematological: Negative for adenopathy. Does not bruise/bleed easily.  Psychiatric/Behavioral: Negative.     Objective:  BP 130/72 (BP Location: Left Arm, Patient Position: Sitting, Cuff Size: Normal)   Pulse 80   Temp 97.8 F (36.6 C) (Oral)   Ht 5' 10.5" (1.791 m)   Wt 192 lb 12 oz (87.4 kg)   SpO2 96%   BMI 27.27 kg/m   BP Readings from Last 3 Encounters:  11/08/18 130/72  09/01/18 124/66  07/13/18 134/70    Wt Readings from Last 3 Encounters:  11/08/18 192 lb 12 oz (87.4 kg)  09/01/18 195 lb (88.5 kg)  07/13/18 202 lb (91.6 kg)    Physical Exam  Constitutional: He is oriented to person, place, and time. No distress.  HENT:  Mouth/Throat: Oropharynx is clear and moist. No oropharyngeal exudate.  Eyes: Conjunctivae are normal. No scleral icterus.  Neck: Normal range of motion. Neck supple. No JVD present. No thyromegaly present.  Cardiovascular: Normal rate, regular rhythm and normal heart sounds. Exam reveals no gallop.  No murmur heard. Pulmonary/Chest: Effort normal and breath sounds normal. He has no wheezes. He has no rales.  Abdominal: Soft. Bowel sounds are normal. He exhibits no mass. There is no hepatosplenomegaly. There is no tenderness.  Genitourinary:  Genitourinary Comments: GU and rectal exams were deferred at his request.  Musculoskeletal: Normal range of motion. He exhibits no edema, tenderness or deformity.  Lymphadenopathy:    He has no cervical adenopathy.  Neurological: He is alert and oriented to person, place, and time.  Skin: Skin is warm and dry. No rash noted. He is not diaphoretic.  Vitals reviewed.   Lab Results  Component Value Date   WBC 7.4 11/08/2018   HGB 13.5 11/08/2018   HCT 41.0 11/08/2018   PLT 271.0 11/08/2018   GLUCOSE 173 (H) 11/08/2018   CHOL 162 11/08/2018   TRIG 199.0 (H) 11/08/2018   HDL 31.60 (L)  11/08/2018   LDLDIRECT 108.0 11/17/2017   LDLCALC 91 11/08/2018   ALT 12 04/26/2018   AST 17 04/26/2018   NA 136 11/08/2018   K 4.4 11/08/2018   CL 102 11/08/2018   CREATININE 1.49 11/08/2018   BUN 19 11/08/2018   CO2 26 11/08/2018   TSH 3.40 11/08/2018   PSA 1.35 03/24/2011   HGBA1C 7.5 (H) 11/08/2018   MICROALBUR <0.7 11/08/2018    Ct Chest High Resolution  Result Date: 03/26/2018 CLINICAL DATA:  Worsening dyspnea with exertion. Former smoker. Evaluate for interstitial lung disease. EXAM: CT CHEST WITHOUT CONTRAST TECHNIQUE: Multidetector CT imaging of the chest was performed following the standard protocol without intravenous contrast. High resolution imaging of the lungs, as well as inspiratory and expiratory imaging, was  performed. COMPARISON:  01/21/2017 chest radiograph. FINDINGS: Cardiovascular: Normal heart size. No significant pericardial fluid/thickening. Left anterior descending and left circumflex coronary atherosclerosis. Atherosclerotic nonaneurysmal thoracic aorta. Borderline prominent main pulmonary artery (3.3 cm diameter). Mediastinum/Nodes: No discrete thyroid nodules. Unremarkable esophagus. No axillary adenopathy. Mild right paratracheal adenopathy measuring up to 1.1 cm (series 2/image 63). Mildly enlarged 1.2 cm subcarinal node (series 2/image 77). No discrete hilar adenopathy on these noncontrast images. Lungs/Pleura: No pneumothorax. No pleural effusion. No acute consolidative airspace disease, lung masses or significant pulmonary nodules. Mild patchy air trapping in the upper lungs on the expiration sequence. There is patchy confluent subpleural and peribronchovascular reticulation and ground-glass attenuation in both lungs with associated mild-to-moderate traction bronchiectasis and architectural distortion. There is a basilar gradient to these findings. There is possible early honeycombing in the left lower lobe (series 3/image 104 and series 8/image 81). Upper abdomen:  Cholelithiasis. Musculoskeletal: No aggressive appearing focal osseous lesions. Mild thoracic spondylosis. IMPRESSION: 1. 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 Consider follow-up high-resolution chest CT in 6-12 months to assess temporal pattern stability, as clinically warranted. 2. Mild patchy air trapping in the upper lungs, indicative of small airways disease. 3. Nonspecific mild mediastinal lymphadenopathy, probably reactive. 4. Two-vessel coronary atherosclerosis. 5. Borderline dilated main pulmonary artery. 6. Cholelithiasis. Aortic Atherosclerosis (ICD10-I70.0). Electronically Signed   By: Ilona Sorrel M.D.   On: 03/26/2018 10:47    Assessment & Plan:   Ransom was seen today for annual exam, hyperlipidemia and anemia.  Diagnoses and all orders for this visit:  ANEMIA, B12 DEFICIENCY- His H&H and B12 level are normal now. -     CBC with Differential/Platelet; Future -     Vitamin B12; Future -     Folate; Future  Essential hypertension- His blood pressure is adequately well controlled.  Electrolytes and renal function are normal. -     Basic metabolic panel; Future -     Cancel: Urinalysis, Routine w reflex microscopic; Future -     Urinalysis, Routine w reflex microscopic; Future  Type 2 diabetes, uncontrolled, with renal manifestation ([place])- His A1c is at 7.5%.  His blood sugars are adequately well controlled. -     Basic metabolic panel; Future -     Hemoglobin A1c; Future -     Microalbumin / creatinine urine ratio; Future  Renal insufficiency- His renal function is stable.  He was encouraged to avoid nephrotoxic agents.  Will continue to work towards good control of his blood pressure and blood sugars. -     Basic metabolic panel; Future -     Microalbumin / creatinine urine ratio; Future -     Urinalysis, Routine w reflex  microscopic; Future  Dyslipidemia, goal LDL below 100- He has a significantly elevated ASCVD risk score so I asked him to take a statin and aspirin daily for CV risk reduction. -     Lipid panel; Future -     TSH; Future -     Pitavastatin Calcium (LIVALO) 2 MG TABS; Take 1 tablet (2 mg total) by mouth daily. -     aspirin 81 MG tablet; Take 1 tablet (81 mg total) by mouth daily.  Deficiency anemia- His H&H are normal now.  I will screen him for vitamin deficiencies. -     CBC with Differential/Platelet; Future -     Vitamin B1; Future -     IBC panel;  Future -     Ferritin; Future  Other orders -     Zoster Vaccine Adjuvanted [facility]) injection; Inject 0.5 mLs into the muscle once for 1 dose.   I have changed Otila Back. [place]'s aspirin. I am also having him start on Zoster Vaccine Adjuvanted and Pitavastatin Calcium. Additionally, I am having him maintain his Chlorpheniramine-Acetaminophen (CORICIDIN HBP COLD/FLU PO), Multiple Vitamins-Minerals (VITEYES AREDS ADVANCED PO), famotidine, acetaminophen, losartan, SitaGLIPtin-MetFORMIN HCl, omeprazole, atenolol, and fenofibrate.  Meds ordered this encounter  Medications  . Zoster Vaccine Adjuvanted [facility]) injection    Sig: Inject 0.5 mLs into the muscle once for 1 dose.    Dispense:  0.5 mL    Refill:  1  . Pitavastatin Calcium (LIVALO) 2 MG TABS    Sig: Take 1 tablet (2 mg total) by mouth daily.    Dispense:  90 tablet    Refill:  1  . aspirin 81 MG tablet    Sig: Take 1 tablet (81 mg total) by mouth daily.    Dispense:  90 tablet    Refill:  1   See AVS for instructions about healthy living and anticipatory guidance.  Follow-up: Return in about 6 months (around 05/09/2019).  Scarlette Calico, MD

## 2018-11-09 DIAGNOSIS — Z0000 Encounter for general adult medical examination without abnormal findings: Secondary | ICD-10-CM | POA: Insufficient documentation

## 2018-11-09 MED ORDER — ASPIRIN 81 MG PO TABS: 81.0000 mg | ORAL_TABLET | Freq: Every day | ORAL | 1 refills | Status: AC

## 2018-11-09 MED ORDER — PITAVASTATIN CALCIUM 2 MG PO TABS: 1.0000 | ORAL_TABLET | Freq: Every day | ORAL | 1 refills | Status: DC

## 2018-11-09 NOTE — Assessment & Plan Note (Signed)

## 2018-11-11 ENCOUNTER — Encounter: Payer: Self-pay | Admitting: Internal Medicine

## 2018-11-11 NOTE — Progress Notes (Signed)
Outside notes received. Information abstracted. Notes sent to scan.  

## 2018-11-13 ENCOUNTER — Encounter: Payer: Self-pay | Admitting: Internal Medicine

## 2018-11-13 LAB — VITAMIN B1: Vitamin B1 (Thiamine): 13 nmol/L (ref 8–30)

## 2018-11-29 ENCOUNTER — Encounter: Payer: Self-pay | Admitting: Internal Medicine

## 2018-11-29 ENCOUNTER — Ambulatory Visit (INDEPENDENT_AMBULATORY_CARE_PROVIDER_SITE_OTHER): Payer: Medicare Other | Admitting: *Deleted

## 2018-11-29 ENCOUNTER — Ambulatory Visit: Payer: Medicare Other | Admitting: Internal Medicine

## 2018-11-29 VITALS — BP 122/70 | HR 78 | Ht 70.5 in | Wt 212.0 lb

## 2018-11-29 DIAGNOSIS — J8410 Pulmonary fibrosis, unspecified: Secondary | ICD-10-CM

## 2018-11-29 NOTE — Progress Notes (Signed)
SIX MIN WALK 11/29/2018 09/01/2018 06/28/2018 04/26/2018 12/28/2017 12/26/2016 06/28/2015  Medications aspirin 81mg , atenolol 25mg , losartan potassium 100mg , omeprazole 20mg , pitavastatin 2mg  at 9:30am and cetirizine 10mg  at 12pm - - - Aspirin 81mg , Atenolol 25mg , Glimepiride 2mg , Omeprazole 20mg , Fenofibrate 145mg . All meds were taken this morning around 8am.  - -  Supplimental Oxygen during Test? (L/min) No No No No No No No  Laps 12 - - - 7 - -  Partial Lap (in Meters) 0 - - - 0 - -  Baseline BP (sitting) 124/70 - - - 116/74 - -  Baseline Heartrate 81 - - - 76 - -  Baseline Dyspnea (Borg Scale) 1 - - - 1 - -  Baseline Fatigue (Borg Scale) 1 - - - 1 - -  Baseline SPO2 96 - - - 98 - -  BP (sitting) 130/72 - - - 124/72 - -  Heartrate 79 - - - 80 - -  Dyspnea (Borg Scale) 2 - - - 1 - -  Fatigue (Borg Scale) 3 - - - 1 - -  SPO2 91 - - - 92 - -  BP (sitting) 122/70 - - - 122/70 - -  Heartrate 78 - - - 72 - -  SPO2 96 - - - 96 - -  Stopped or Paused before Six Minutes No - - - No - -  Distance Completed 408 - - - 336 - -  Tech Comments: Pt walked at a good pace during the walk. Pt denied any complaints during the walk. When walk was first finished, O2 sats were 88-89% but pt quickly went up to 91%. normal pace/min SOB//lmr normal pace/no SOB//lmr walked a pace to make him feel winded, but no need to stop for SOB, dizziness or lightheadedness. patient tolerated well Patient was able to complete the entire 51mw without stopping. Denied any calf and chest pain after walk. Did state he has some slight SOB after the walk. O2 was not needed.  Pt. was able to complete 3 full laps without stopping. No c/o of SOB or CP.  normal pace/no SOB//lmr

## 2018-11-29 NOTE — Progress Notes (Signed)
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 Pulmonary Consult    Referred by Dr. Unice Cobble. Pt states having SOB since Dec 2015 after developing URI. He gets SOB with doing yard work and with singing. He states that he also has some PND and occ cough with white to clear sputum.   abruptly ill week before xmas, sense of acute sinus congestion, sore throat chest burning during severe coughing fits assoc with infected tooth rx with clindamycin x 40 days and tooth better, all symptoms improved except now doe x flight of steps or yardwork or fast walking. rec Change prilosec to omeprazole 40 mg Take 30-60 min before first meal of the day and Pepcid ac 20 at bedtime until return GERD diet   12/27/2015  f/u ov/ re: PF/ no change ex tol Chief Complaint  Patient presents with  . Follow-up    CXR done today. Breathing is unchanged. No new co's today.   rec No change rx = ppi qam/ h2hs    06/25/2016  f/u ov/ re: PF/ maint on prilosec but worried about press so stopped pepcid Chief Complaint  Patient presents with  . Follow-up    PFT done today. Breathing is overall doing well.    able to work in the yard ok, some trouble bending over at waist, no limiting doe rec Continue prilosec otc 60m  Take 30-60 min before first meal of the day and Pepcid ac (famotidine) 20 mg one @  bedtime       12/26/2016  f/u ov/ re: PF/ no longer on PPI due to concern with B12 absorption Chief Complaint  Patient presents with  . Follow-up    Breathing has been  worse with colder weather. He is not coughing much, but does have some PND.    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 Follow-up    HRCT done 03/26/18. Breathing is about the same, maybe some worse.   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 Follow-up    heat and cold affect breathing, SOB with exertion, wheezing and rattling in chest especially when lying down, non-productive cough occassionaly  Dyspnea:  Mostly with yardwork/ bending over   SABA use:  None Cough: some flare with 'head cold" x one  week with subj wheeze/ non producitive worse at hs  rec Please remember to go to the lab department downstairs in the basement  for your tests - we will call you with the results when they are available. For cough try delsym 2 tsp every 12 hours as needed - note sats no worse with ex now than 12/2016   05/12/2018  f/u ov/ re:  PF with high ESR  Chief Complaint  Patient presents with  . Follow-up    follow up PFT. breathing unchanged.  Dyspnea:  Yard work / house work all fine Cough: worse than usual since winter 2019 day > noct and worse with ex/ deep breath  Sleep: ok one pillow  rec Prednisone 10 mg  2 daily until better then 1 daily x one week and off> never took it We will apply for OFEV initiation  > pt decided against after reviewing info   06/28/2018  f/u ov/ re: PF  Chief Complaint  Patient presents with  . Follow-up      Breathing has improved some since the last visit. He has not started on prednisone yet.     Dyspnea:  Worse bending over picking up debris on ground Cough: rarely /still using cough drops   SABA use: none 02: none  rec Continue Try prilosec otc 24m  Take 30-60 min before first meal of the day and Pepcid ac (famotidine) 20 mg one @  bedtime until return  GERD  Please remember to go to the lab department downstairs in the basement  for your tests - we will call you with the results when they are available. Please schedule a follow up visit in 2 months but call sooner if needed with pfts on return    - add: esr now double baseline to 66 so rec pred 20 mg ceiling and floor of 0   09/01/2018  f/u ov/ re:  ? IPF  Chief Complaint  Patient presents with  . Follow-up    PFT results, feels like he has rattle in his chest, SHOB  Dyspnea:  Yard work Cough: w/in a week of taking pred 20 mg / day cough resolved and taper off by around July 22 2018 s flare  Sleeping: flat bed/ one pillow SABA use: none 02:  None   rec If breathing/ cough worsen restart prednisone 20 mg daily and when better reduce to 10 mg daily as your new "floor" but be sure to monitor your sugars if you need to restart prednisone  Please schedule a follow up office visit in 6 weeks, call sooner if needed with 6 min walk    11/29/2018  f/u ov/ re:  Chief Complaint  Patient presents with  . Follow-up    Cough has improved some. Breathing has also improved.    Dyspnea:  No change ex tol, admits not checking sats as rec Cough: none Sleeping: fine SABA use: none 02:  None     No obvious day to day or daytime variability or assoc excess/ purulent sputum or mucus plugs or hemoptysis or cp or chest tightness, subjective wheeze or overt sinus or hb symptoms.   Sleeping flat fine  without nocturnal  or early am exacerbation  of respiratory  c/o's or need for noct saba. Also denies any obvious fluctuation of symptoms  with weather or environmental changes or other aggravating or alleviating factors except as outlined above   No unusual exposure hx or h/o childhood pna/ asthma or knowledge of premature  birth.  Current Allergies, Complete Past Medical History, Past Surgical History, Family History, and Social History were reviewed in [employer] record.  ROS  The following are not active complaints unless bolded Hoarseness, sore throat, dysphagia, dental problems, itching, sneezing,  nasal congestion or discharge of excess mucus or purulent secretions, ear ache,   fever, chills, sweats, unintended wt loss or wt gain, classically pleuritic or exertional cp,  orthopnea pnd or arm/hand swelling  or leg swelling, presyncope, palpitations, abdominal pain, anorexia, nausea, vomiting, diarrhea  or change in bowel habits or change in bladder habits, change in stools or change in urine, dysuria, hematuria,  rash, arthralgias, visual complaints, headache, numbness, weakness or ataxia or problems with walking or coordination,  change in mood or  memory.        Current Meds  Medication Sig  . acetaminophen (TYLENOL) 500 MG tablet Take 500 mg by mouth every 6 (six) hours as needed.  Marland Kitchen aspirin 81 MG tablet Take 1 tablet (81 mg total) by mouth daily.  Marland Kitchen atenolol (TENORMIN) 25 MG tablet Take 0.5 tablets (12.5 mg total) by mouth every morning.  . cetirizine (ZYRTEC) 10 MG tablet Take 10 mg by mouth daily as needed for allergies.  . Chlorpheniramine-Acetaminophen (CORICIDIN HBP COLD/FLU PO) Take by mouth.  . famotidine (PEPCID) 20 MG tablet One at bedtime  . fenofibrate (TRICOR) 145 MG tablet TAKE ONE TABLET BY MOUTH ONCE DAILY.  Marland Kitchen losartan (COZAAR) 100 MG tablet TAKE (1) TABLET BY MOUTH ONCE DAILY.  . Multiple Vitamins-Minerals (VITEYES AREDS ADVANCED PO) Take by mouth.  Marland Kitchen omeprazole (PRILOSEC) 20 MG capsule TAKE ONE CAPSULE BY MOUTH DAILY.  Marland Kitchen Pitavastatin Calcium (LIVALO) 2 MG TABS Take 1 tablet (2 mg  total) by mouth daily.  . SitaGLIPtin-MetFORMIN HCl (JANUMET XR) [phone] MG TB24 Take 1 tablet by mouth daily.        Objective:   Physical Exam  amb pleasant wm nad   05/16/2015        209 > 06/28/2015    213  > 12/27/2015   211 > 06/25/2016  206 > 12/26/2016  203> 06/26/2017  211 >   03/29/2018 199 >  04/26/2018  201 > 05/12/2018    201 > 06/28/2018   201 > 09/01/2018    195 > 11/29/2018  212     02/26/15 213 lb (96.616 kg)  02/12/15 213 lb 12 oz (96.956 kg)      Vital signs reviewed - Note on arrival 02 sats  96% on RA       HEENT: nl dentition, turbinates bilaterally, and oropharynx. Nl external ear canals without cough reflex   NECK :  without JVD/Nodes/TM/ nl carotid upstrokes bilaterally   LUNGS: no acc muscle use,  Nl contour chest faint insp crackles bases  bilaterally without cough on insp or exp maneuvers   CV:  RRR  no s3 or murmur or increase in P2, and no edema   ABD:  soft and nontender with nl inspiratory excursion in the supine position. No bruits or organomegaly appreciated, bowel sounds nl  MS:  Nl gait/ ext warm without deformities, calf tenderness, cyanosis or - no convincing clubbing No obvious joint restrictions   SKIN: warm and dry without lesions    NEURO:  alert, approp, nl sensorium with  no motor or cerebellar deficits apparent.           Assessment & Plan:

## 2018-11-29 NOTE — Patient Instructions (Addendum)
Measure your oxygen saturation with the same activity as usual once a week - call if downward trend any lower than 88%    Please schedule a follow up visit in 4  months but call sooner if needed with pfts on return

## 2018-11-30 ENCOUNTER — Encounter: Payer: Self-pay | Admitting: Internal Medicine

## 2018-11-30 NOTE — Assessment & Plan Note (Signed)
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 -Smoking history encompassed less than 5 years. He's had no significant occupational exposures - 02/26/2015  Walked RA x 3 laps @ 185 ft each stopped due to  End of study , nl pace, no desats - 02/26/2015 collagen vasc screen sent > neg with esr 28  - 02/26/2015 rec max gerd rx/ diet and > improved 05/16/15  - 06/28/2015  VC 3.65 (85%) no obst and dlco 56 corrects  to 81  - 06/28/2015  Walked RA x 3 laps @ 185 ft each stopped due to End of study, nl pace, no sob or desat  - PFT's  06/25/2016   VC  3.85  (91%)  DLCO  53/51 % corrects to 72 % for alv volume  - 12/26/2016  Walked RA x 3 laps @ 185 ft each stopped due to  End of study, nl pace, no sob /sats 91% at end   - PFTs  06/26/2017   VC  3.33 (79%)   DLCO  62/59  %  Corrects to 93%    - 12/28/2017  6 m walk  =  336 sats ok   - HRCT chest 03/25/18 :  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 - 04/26/2018  Walked RA x 3 laps @ 185 ft each stopped due to  End of study, nl pace, sat 92% at end, min sob  - PFT's  05/12/2018  VC 3.08 (74%)  w obst DLCO  43 % corrects to 77  % for alv volume   - ESR 49  04/26/17 so 05/12/2018  rec pred 20 mg daily until cough better then 10 mg x one week and stop > never took it  - 05/12/2018 initiate ofev paperwork > declined  - 06/28/2018  Walked RA x 3 laps @ 185 ft each stopped due to  End of study, nl pace, no sob but  desat  To as low as 88%   - ESR 06/28/2018 up to 66 rec pred 20 mg per day until breathing better then taper off> cough resolved   - PFT's  09/01/2018  FVC  2.93(70%)   p no % improvement from saba p no prior to study  / not able to do dlco    - ESR 51 off prednisone x 2 weeks  - 09/01/2018  Walked RA x 3 laps @ 185 ft each stopped due to  End of study, slow pace, no sob and sats 89% at end    - 59m  = 408  meters, lowest sat was  88%   Despite desats I can't convince him to re-consider OFEV at this poin t  Discussed in detail all the  indications, usual  risks and alternatives  relative to the benefits with patient who agrees to proceed with conservative f/u on GERD Rx, again advised to monitor sats while walking at least weekly and f/u here with pfts in 4 m but call sooner if downward trend is ex tol/ sats  Each maintenance medication was reviewed in detail including most importantly the difference between maintenance and as needed and under what circumstances the prns are to be used.  Please see AVS for specific  Instructions which are unique to this visit and I personally typed out  which were reviewed in detail in writing with the patient and a copy provided.

## 2019-02-08 ENCOUNTER — Other Ambulatory Visit: Payer: Self-pay | Admitting: Internal Medicine

## 2019-02-19 ENCOUNTER — Other Ambulatory Visit: Payer: Self-pay | Admitting: Internal Medicine

## 2019-02-28 ENCOUNTER — Other Ambulatory Visit: Payer: Self-pay | Admitting: Internal Medicine

## 2019-02-28 DIAGNOSIS — E1165 Type 2 diabetes mellitus with hyperglycemia: Principal | ICD-10-CM

## 2019-02-28 DIAGNOSIS — IMO0002 Reserved for concepts with insufficient information to code with codable children: Secondary | ICD-10-CM

## 2019-02-28 DIAGNOSIS — E1129 Type 2 diabetes mellitus with other diabetic kidney complication: Secondary | ICD-10-CM

## 2019-03-03 ENCOUNTER — Other Ambulatory Visit: Payer: Self-pay | Admitting: Internal Medicine

## 2019-03-03 DIAGNOSIS — E785 Hyperlipidemia, unspecified: Secondary | ICD-10-CM

## 2019-03-22 ENCOUNTER — Other Ambulatory Visit: Payer: Self-pay | Admitting: Internal Medicine

## 2019-04-04 ENCOUNTER — Ambulatory Visit: Payer: Medicare Other | Admitting: Internal Medicine

## 2019-04-13 ENCOUNTER — Other Ambulatory Visit: Payer: Self-pay | Admitting: Internal Medicine

## 2019-05-10 ENCOUNTER — Ambulatory Visit: Payer: Medicare Other | Admitting: Internal Medicine

## 2019-05-30 ENCOUNTER — Other Ambulatory Visit: Payer: Self-pay | Admitting: Internal Medicine

## 2019-05-30 DIAGNOSIS — E1129 Type 2 diabetes mellitus with other diabetic kidney complication: Secondary | ICD-10-CM

## 2019-05-30 DIAGNOSIS — IMO0002 Reserved for concepts with insufficient information to code with codable children: Secondary | ICD-10-CM

## 2019-06-15 ENCOUNTER — Ambulatory Visit (INDEPENDENT_AMBULATORY_CARE_PROVIDER_SITE_OTHER)
Admission: RE | Admit: 2019-06-15 | Discharge: 2019-06-15 | Disposition: A | Discharge status: Home / Self Care | Payer: Medicare Other | Source: Ambulatory Visit | Attending: Internal Medicine | Admitting: Internal Medicine

## 2019-06-15 ENCOUNTER — Ambulatory Visit (INDEPENDENT_AMBULATORY_CARE_PROVIDER_SITE_OTHER): Payer: Medicare Other | Admitting: Internal Medicine

## 2019-06-15 ENCOUNTER — Other Ambulatory Visit (INDEPENDENT_AMBULATORY_CARE_PROVIDER_SITE_OTHER): Payer: Medicare Other

## 2019-06-15 ENCOUNTER — Other Ambulatory Visit: Payer: Self-pay

## 2019-06-15 ENCOUNTER — Encounter: Payer: Self-pay | Admitting: Internal Medicine

## 2019-06-15 VITALS — BP 134/70 | HR 72 | Temp 98.0°F | Ht 70.5 in | Wt 185.0 lb

## 2019-06-15 DIAGNOSIS — D518 Other vitamin B12 deficiency anemias: Secondary | ICD-10-CM | POA: Diagnosis not present

## 2019-06-15 DIAGNOSIS — E1129 Type 2 diabetes mellitus with other diabetic kidney complication: Secondary | ICD-10-CM | POA: Diagnosis not present

## 2019-06-15 DIAGNOSIS — R0609 Other forms of dyspnea: Secondary | ICD-10-CM

## 2019-06-15 DIAGNOSIS — R0789 Other chest pain: Secondary | ICD-10-CM | POA: Diagnosis not present

## 2019-06-15 DIAGNOSIS — E781 Pure hyperglyceridemia: Secondary | ICD-10-CM

## 2019-06-15 DIAGNOSIS — J8410 Pulmonary fibrosis, unspecified: Secondary | ICD-10-CM

## 2019-06-15 DIAGNOSIS — E1165 Type 2 diabetes mellitus with hyperglycemia: Secondary | ICD-10-CM | POA: Diagnosis not present

## 2019-06-15 DIAGNOSIS — IMO0002 Reserved for concepts with insufficient information to code with codable children: Secondary | ICD-10-CM

## 2019-06-15 DIAGNOSIS — I10 Essential (primary) hypertension: Secondary | ICD-10-CM | POA: Diagnosis not present

## 2019-06-15 LAB — CBC WITH DIFFERENTIAL/PLATELET
Basophils Absolute: 0 10*3/uL (ref 0.0–0.1)
Basophils Relative: 0.7 % (ref 0.0–3.0)
Eosinophils Absolute: 0.3 10*3/uL (ref 0.0–0.7)
Eosinophils Relative: 4.9 % (ref 0.0–5.0)
HCT: 39.3 % (ref 39.0–52.0)
Hemoglobin: 13 g/dL (ref 13.0–17.0)
Lymphocytes Relative: 30.1 % (ref 12.0–46.0)
Lymphs Abs: 2 10*3/uL (ref 0.7–4.0)
MCHC: 33 g/dL (ref 30.0–36.0)
MCV: 90 fl (ref 78.0–100.0)
Monocytes Absolute: 0.5 10*3/uL (ref 0.1–1.0)
Monocytes Relative: 6.9 % (ref 3.0–12.0)
Neutro Abs: 3.8 10*3/uL (ref 1.4–7.7)
Neutrophils Relative %: 57.4 % (ref 43.0–77.0)
Platelets: 269 10*3/uL (ref 150.0–400.0)
RBC: 4.37 Mil/uL (ref 4.22–5.81)
RDW: 13.9 % (ref 11.5–15.5)
WBC: 6.6 10*3/uL (ref 4.0–10.5)

## 2019-06-15 LAB — LIPID PANEL
Cholesterol: 129 mg/dL (ref 0–200)
HDL: 35.8 mg/dL — ABNORMAL LOW (ref >39.00)
LDL Cholesterol: 68 mg/dL (ref 0–99)
NonHDL: 92.81
Total CHOL/HDL Ratio: 4
Triglycerides: 122 mg/dL (ref 0.0–149.0)
VLDL: 24.4 mg/dL (ref 0.0–40.0)

## 2019-06-15 LAB — BASIC METABOLIC PANEL
BUN: 27 mg/dL — ABNORMAL HIGH (ref 6–23)
CO2: 25 mEq/L (ref 19–32)
Calcium: 9.9 mg/dL (ref 8.4–10.5)
Chloride: 105 mEq/L (ref 96–112)
Creatinine, Ser: 1.52 mg/dL — ABNORMAL HIGH (ref 0.40–1.50)
GFR: 44.31 mL/min — ABNORMAL LOW (ref >60.00)
Glucose, Bld: 115 mg/dL — ABNORMAL HIGH (ref 70–99)
Potassium: 4.6 mEq/L (ref 3.5–5.1)
Sodium: 137 mEq/L (ref 135–145)

## 2019-06-15 LAB — FOLATE: Folate: 20.2 ng/mL (ref >5.9)

## 2019-06-15 LAB — VITAMIN B12: Vitamin B-12: 251 pg/mL (ref 211–911)

## 2019-06-15 NOTE — Patient Instructions (Signed)
Type 2 Diabetes Mellitus, Diagnosis, Adult Type 2 diabetes (type 2 diabetes mellitus) is a long-term (chronic) disease. In type 2 diabetes, one or both of these problems may be present:  The pancreas does not make enough of a hormone called insulin.  Cells in the body do not respond properly to insulin that the body makes (insulin resistance). Normally, insulin allows blood sugar (glucose) to enter cells in the body. The cells use glucose for energy. Insulin resistance or lack of insulin causes excess glucose to build up in the blood instead of going into cells. As a result, high blood glucose (hyperglycemia) develops. What increases the risk? The following factors may make you more likely to develop type 2 diabetes:  Having a family member with type 2 diabetes.  Being overweight or obese.  Having an inactive (sedentary) lifestyle.  Having been diagnosed with insulin resistance.  Having a history of prediabetes, gestational diabetes, or polycystic ovary syndrome (PCOS).  Being of American-Indian, African-American, Hispanic/Latino, or Asian/Pacific Islander descent. What are the signs or symptoms? In the early stage of this condition, you may not have symptoms. Symptoms develop slowly and may include:  Increased thirst (polydipsia).  Increased hunger(polyphagia).  Increased urination (polyuria).  Increased urination during the night (nocturia).  Unexplained weight loss.  Frequent infections that keep coming back (recurring).  Fatigue.  Weakness.  Vision changes, such as blurry vision.  Cuts or bruises that are slow to heal.  Tingling or numbness in the hands or feet.  Dark patches on the skin (acanthosis nigricans). How is this diagnosed? This condition is diagnosed based on your symptoms, your medical history, a physical exam, and your blood glucose level. Your blood glucose may be checked with one or more of the following blood tests:  A fasting blood glucose (FBG)  test. You will not be allowed to eat (you will fast) for 8 hours or longer before a blood sample is taken.  A random blood glucose test. This test checks blood glucose at any time of day regardless of when you ate.  An A1c (hemoglobin A1c) blood test. This test provides information about blood glucose control over the previous 2-3 months.  An oral glucose tolerance test (OGTT). This test measures your blood glucose at two times: ? After fasting. This is your baseline blood glucose level. ? Two hours after drinking a beverage that contains glucose. You may be diagnosed with type 2 diabetes if:  Your FBG level is 126 mg/dL (7.0 mmol/L) or higher.  Your random blood glucose level is 200 mg/dL (11.1 mmol/L) or higher.  Your A1c level is 6.5% or higher.  Your OGTT result is higher than 200 mg/dL (11.1 mmol/L). These blood tests may be repeated to confirm your diagnosis. How is this treated? Your treatment may be managed by a specialist called an endocrinologist. Type 2 diabetes may be treated by following instructions from your health care provider about:  Making diet and lifestyle changes. This may include: ? Following an individualized nutrition plan that is developed by a diet and nutrition specialist (registered dietitian). ? Exercising regularly. ? Finding ways to manage stress.  Checking your blood glucose level as often as told.  Taking diabetes medicines or insulin daily. This helps to keep your blood glucose levels in the healthy range. ? If you use insulin, you may need to adjust the dosage depending on how physically active you are and what foods you eat. Your health care provider will tell you how to adjust your dosage.    Taking medicines to help prevent complications from diabetes, such as: ? Aspirin. ? Medicine to lower cholesterol. ? Medicine to control blood pressure. Your health care provider will set individualized treatment goals for you. Your goals will be based on  your age, other medical conditions you have, and how you respond to diabetes treatment. Generally, the goal of treatment is to maintain the following blood glucose levels:  Before meals (preprandial): 80-130 mg/dL (4.4-7.2 mmol/L).  After meals (postprandial): below 180 mg/dL (10 mmol/L).  A1c level: less than 7%. Follow these instructions at home: Questions to ask your health care provider  Consider asking the following questions: ? Do I need to meet with a diabetes educator? ? Where can I find a support group for people with diabetes? ? What equipment will I need to manage my diabetes at home? ? What diabetes medicines do I need, and when should I take them? ? How often do I need to check my blood glucose? ? What number can I call if I have questions? ? When is my next appointment? General instructions  Take over-the-counter and prescription medicines only as told by your health care provider.  Keep all follow-up visits as told by your health care provider. This is important.  For more information about diabetes, visit: ? American Diabetes Association (ADA): www.diabetes.org ? American Association of Diabetes Educators (AADE): www.diabeteseducator.org Contact a health care provider if:  Your blood glucose is at or above 240 mg/dL (13.3 mmol/L) for 2 days in a row.  You have been sick or have had a fever for 2 days or longer, and you are not getting better.  You have any of the following problems for more than 6 hours: ? You cannot eat or drink. ? You have nausea and vomiting. ? You have diarrhea. Get help right away if:  Your blood glucose is lower than 54 mg/dL (3.0 mmol/L).  You become confused or you have trouble thinking clearly.  You have difficulty breathing.  You have moderate or large ketone levels in your urine. Summary  Type 2 diabetes (type 2 diabetes mellitus) is a long-term (chronic) disease. In type 2 diabetes, the pancreas does not make enough of a  hormone called insulin, or cells in the body do not respond properly to insulin that the body makes (insulin resistance).  This condition is treated by making diet and lifestyle changes and taking diabetes medicines or insulin.  Your health care provider will set individualized treatment goals for you. Your goals will be based on your age, other medical conditions you have, and how you respond to diabetes treatment.  Keep all follow-up visits as told by your health care provider. This is important. This information is not intended to replace advice given to you by your health care provider. Make sure you discuss any questions you have with your health care provider. Document Released: 12/08/2005 Document Revised: 07/09/2017 Document Reviewed: 01/11/2016 Elsevier Interactive Patient Education  2019 Elsevier Inc.  

## 2019-06-15 NOTE — Progress Notes (Signed)
Subjective:  Patient ID: Nathan Howard, male    DOB: Jun 24, 1941  Age: 78 y.o. MRN: [id number]  CC: Diabetes, Anemia, and Hypertension   HPI Nathan Howard presents for f/up - He complains of worsening dyspnea on exertion for the last 6 months.  He has a mild intermittent nonproductive cough.  He denies chest pain, diaphoresis, dizziness, or lightheadedness.  He has also had intermittent episodes of pain in his left posterior rib cage.  He is taking Aleve and Tylenol to control the pain.  Outpatient Medications Prior to Visit  Medication Sig Dispense Refill  . acetaminophen (TYLENOL) 500 MG tablet Take 500 mg by mouth every 6 (six) hours as needed.    Marland Kitchen aspirin 81 MG tablet Take 1 tablet (81 mg total) by mouth daily. 90 tablet 1  . atenolol (TENORMIN) 25 MG tablet TAKE 1/2 TABLET BY MOUTH EVERY MORNING. 45 tablet 0  . cetirizine (ZYRTEC) 10 MG tablet Take 10 mg by mouth daily as needed for allergies.    . Chlorpheniramine-Acetaminophen (CORICIDIN HBP COLD/FLU PO) Take by mouth.    . famotidine (PEPCID) 20 MG tablet One at bedtime    . fenofibrate (TRICOR) 145 MG tablet TAKE ONE TABLET BY MOUTH ONCE DAILY. 90 tablet 0  . JANUMET XR [phone] MG TB24 TAKE 1 TABLET BY MOUTH ONCE A DAY. 90 tablet 0  . LIVALO 2 MG TABS TAKE 1 TABLET BY MOUTH ONCE A DAY. 90 tablet 0  . losartan (COZAAR) 100 MG tablet TAKE (1) TABLET BY MOUTH ONCE DAILY. 90 tablet 0  . Multiple Vitamins-Minerals (VITEYES AREDS ADVANCED PO) Take by mouth.    Marland Kitchen omeprazole (PRILOSEC) 20 MG capsule TAKE ONE CAPSULE BY MOUTH DAILY. 90 capsule 0   No facility-administered medications prior to visit.     ROS Review of Systems  Constitutional: Negative for diaphoresis and fatigue.  HENT: Negative.  Negative for sore throat, trouble swallowing and voice change.   Eyes: Negative for visual disturbance.  Respiratory: Positive for cough and shortness of breath. Negative for chest tightness and wheezing.   Cardiovascular:  Positive for chest pain. Negative for palpitations.  Gastrointestinal: Negative for abdominal pain, constipation, diarrhea, nausea and vomiting.  Genitourinary: Negative.  Negative for difficulty urinating and hematuria.  Musculoskeletal: Negative.  Negative for arthralgias, back pain and myalgias.  Skin: Negative for color change.  Neurological: Negative.  Negative for dizziness, weakness and light-headedness.  Hematological: Negative for adenopathy. Does not bruise/bleed easily.  Psychiatric/Behavioral: Negative.     Objective:  BP 134/70 (BP Location: Left Arm, Patient Position: Sitting, Cuff Size: Normal)   Pulse 72   Temp 98 F (36.7 C) (Oral)   Ht 5' 10.5" (1.791 m)   Wt 185 lb (83.9 kg)   SpO2 91%   BMI 26.17 kg/m   BP Readings from Last 3 Encounters:  06/15/19 134/70  11/29/18 122/70  11/08/18 130/72    Wt Readings from Last 3 Encounters:  06/15/19 185 lb (83.9 kg)  11/29/18 212 lb (96.2 kg)  11/08/18 192 lb 12 oz (87.4 kg)    Physical Exam Vitals signs reviewed.  Constitutional:      General: He is not in acute distress.    Appearance: He is not ill-appearing, toxic-appearing or diaphoretic.  HENT:     Nose: No congestion.     Mouth/Throat:     Mouth: Mucous membranes are moist.     Pharynx: No oropharyngeal exudate or posterior oropharyngeal erythema.  Eyes:  General: No scleral icterus.    Conjunctiva/sclera: Conjunctivae normal.  Neck:     Musculoskeletal: Normal range of motion. No neck rigidity.  Cardiovascular:     Rate and Rhythm: Normal rate and regular rhythm.     Heart sounds: No murmur. No friction rub. No gallop.      Comments: EKG ---  Sinus  Rhythm  WITHIN NORMAL LIMITS  Pulmonary:     Effort: Pulmonary effort is normal. No respiratory distress.     Breath sounds: No stridor. No wheezing, rhonchi or rales.  Chest:     Chest wall: No mass, deformity, swelling, tenderness, crepitus or edema.  Abdominal:     General: Abdomen is  flat. Bowel sounds are normal. There is no distension.     Palpations: There is no hepatomegaly or splenomegaly.     Tenderness: There is no abdominal tenderness.  Musculoskeletal: Normal range of motion.        General: No swelling.     Right lower leg: No edema.     Left lower leg: No edema.  Lymphadenopathy:     Cervical: No cervical adenopathy.     Upper Body:     Right upper body: No supraclavicular, axillary or pectoral adenopathy.     Left upper body: No supraclavicular, axillary or pectoral adenopathy.  Skin:    General: Skin is warm and dry.  Neurological:     General: No focal deficit present.     Mental Status: Mental status is at baseline.  Psychiatric:        Mood and Affect: Mood normal.        Behavior: Behavior normal.     Lab Results  Component Value Date   WBC 6.6 06/15/2019   HGB 13.0 06/15/2019   HCT 39.3 06/15/2019   PLT 269.0 06/15/2019   GLUCOSE 115 (H) 06/15/2019   CHOL 129 06/15/2019   TRIG 122.0 06/15/2019   HDL 35.80 (L) 06/15/2019   LDLDIRECT 108.0 11/17/2017   LDLCALC 68 06/15/2019   ALT 12 04/26/2018   AST 17 04/26/2018   NA 137 06/15/2019   K 4.6 06/15/2019   CL 105 06/15/2019   CREATININE 1.52 (H) 06/15/2019   BUN 27 (H) 06/15/2019   CO2 25 06/15/2019   TSH 3.40 11/08/2018   PSA 1.35 03/24/2011   HGBA1C 6.8 (H) 06/15/2019   MICROALBUR <0.7 11/08/2018    Ct Chest High Resolution  Result Date: 03/26/2018 CLINICAL DATA:  Worsening dyspnea with exertion. Former smoker. Evaluate for interstitial lung disease. EXAM: CT CHEST WITHOUT CONTRAST TECHNIQUE: Multidetector CT imaging of the chest was performed following the standard protocol without intravenous contrast. High resolution imaging of the lungs, as well as inspiratory and expiratory imaging, was performed. COMPARISON:  01/21/2017 chest radiograph. FINDINGS: Cardiovascular: Normal heart size. No significant pericardial fluid/thickening. Left anterior descending and left circumflex  coronary atherosclerosis. Atherosclerotic nonaneurysmal thoracic aorta. Borderline prominent main pulmonary artery (3.3 cm diameter). Mediastinum/Nodes: No discrete thyroid nodules. Unremarkable esophagus. No axillary adenopathy. Mild right paratracheal adenopathy measuring up to 1.1 cm (series 2/image 63). Mildly enlarged 1.2 cm subcarinal node (series 2/image 77). No discrete hilar adenopathy on these noncontrast images. Lungs/Pleura: No pneumothorax. No pleural effusion. No acute consolidative airspace disease, lung masses or significant pulmonary nodules. Mild patchy air trapping in the upper lungs on the expiration sequence. There is patchy confluent subpleural and peribronchovascular reticulation and ground-glass attenuation in both lungs with associated mild-to-moderate traction bronchiectasis and architectural distortion. There is a basilar gradient to  these findings. There is possible early honeycombing in the left lower lobe (series 3/image 104 and series 8/image 81). Upper abdomen: Cholelithiasis. Musculoskeletal: No aggressive appearing focal osseous lesions. Mild thoracic spondylosis. IMPRESSION: 1. 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 Consider follow-up high-resolution chest CT in 6-12 months to assess temporal pattern stability, as clinically warranted. 2. Mild patchy air trapping in the upper lungs, indicative of small airways disease. 3. Nonspecific mild mediastinal lymphadenopathy, probably reactive. 4. Two-vessel coronary atherosclerosis. 5. Borderline dilated main pulmonary artery. 6. Cholelithiasis. Aortic Atherosclerosis (ICD10-I70.0). Electronically Signed   By: Ilona Sorrel M.D.   On: 03/26/2018 10:47   Dg Chest 2 View  Result Date: 06/16/2019 CLINICAL DATA:  Dyspnea.  Pulmonary fibrosis.  Left chest wall pain. EXAM: CHEST - 2 VIEW  COMPARISON:  01/21/2017 and chest CT 03/25/2018 FINDINGS: Diffuse interstitial thickening compatible with fibrosis. Fibrotic changes are similar to the prior examinations. Heart and mediastinum are stable and within normal limits. No large pleural effusions. No acute bone abnormality. IMPRESSION: Pulmonary fibrosis and minimal change from the prior examinations. No acute findings. Electronically Signed   By: Markus Daft M.D.   On: 06/16/2019 09:06     Assessment & Plan:   Treavor was seen today for diabetes, anemia and hypertension.  Diagnoses and all orders for this visit:  Essential hypertension- His blood pressure is adequately well controlled.  Type 2 diabetes, uncontrolled, with renal manifestation ([place])- He is maintaining good glycemic control. -     Basic metabolic panel; Future -     Hemoglobin A1c; Future  ANEMIA, B12 DEFICIENCY- He is maintaining a normal B12 level on high-dose oral B12 supplementation. -     CBC with Differential/Platelet; Future -     Folate; Future -     Vitamin B12; Future  Pure hypertriglyceridemia - Improvement noted. -     Lipid panel; Future  Pulmonary fibrosis ([place])- I think this is the cause for his worsening shortness of breath.  I have asked him to see his pulmonologist soon. -     Ambulatory referral to Pulmonology -     DG Chest 2 View; Future  DOE (dyspnea on exertion)- His EKG is negative for LVH or ischemia.  I have asked him to see his pulmonologist to see if he needs to start using oxygen. -     EKG 12-Lead -     DG Chest 2 View; Future  Left-sided chest wall pain- Examination and plain films are unremarkable.  This is likely musculoskeletal chest wall pain.  He will continue the current over-the-counter remedies for symptom relief. -     DG Chest 2 View; Future   I am having Nathan Howard start on cyanocobalamin. I am also having him maintain his Chlorpheniramine-Acetaminophen (CORICIDIN HBP COLD/FLU PO), Multiple  Vitamins-Minerals (VITEYES AREDS ADVANCED PO), famotidine, acetaminophen, aspirin, cetirizine, omeprazole, fenofibrate, Livalo, losartan, atenolol, and Janumet XR.  Meds ordered this encounter  Medications  . cyanocobalamin 2000 MCG tablet    Sig: Take 1 tablet (2,000 mcg total) by mouth daily.    Dispense:  90 tablet    Refill:  1     Follow-up: Return in about 6 months (around 12/15/2019).  Scarlette Calico, MD

## 2019-06-16 ENCOUNTER — Encounter: Payer: Self-pay | Admitting: Internal Medicine

## 2019-06-16 LAB — HEMOGLOBIN A1C: Hgb A1c MFr Bld: 6.8 % — ABNORMAL HIGH (ref 4.6–6.5)

## 2019-06-16 MED ORDER — CYANOCOBALAMIN 2000 MCG PO TABS: 2000.0000 ug | ORAL_TABLET | Freq: Every day | ORAL | 1 refills | Status: DC

## 2019-06-22 ENCOUNTER — Other Ambulatory Visit: Payer: Self-pay

## 2019-06-22 ENCOUNTER — Encounter: Payer: Self-pay | Admitting: Internal Medicine

## 2019-06-22 ENCOUNTER — Ambulatory Visit: Payer: Medicare Other | Admitting: Internal Medicine

## 2019-06-22 DIAGNOSIS — J8410 Pulmonary fibrosis, unspecified: Secondary | ICD-10-CM | POA: Diagnosis not present

## 2019-06-22 NOTE — Patient Instructions (Addendum)
No change medications   Monitor your 02 saturations while on routine walk and call if trending down    Please schedule a follow up visit in 4  months but call sooner if needed

## 2019-06-22 NOTE — Progress Notes (Signed)
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 Pulmonary Consult    Referred by Nathan Howard. Pt states having SOB since Dec 2015 after developing URI. He gets SOB with doing yard work and with singing. He states that he also has some PND and occ cough with white to clear sputum.   abruptly ill week before xmas, sense of acute sinus congestion, sore throat chest burning during severe coughing fits assoc with infected tooth rx with clindamycin x 40 days and tooth better, all symptoms improved except now doe x flight of steps or yardwork or fast walking. rec Change prilosec to omeprazole 40 mg Take 30-60 min before first meal of the day and Pepcid ac 20 at bedtime until return GERD diet   12/27/2015  f/u ov/Nathan Howard re: PF/ no change ex tol Chief Complaint  Patient presents with  . Follow-up    CXR done today. Breathing is unchanged. No new co's today.   rec No change rx = ppi qam/ h2hs    06/25/2016  f/u ov/Nathan Howard re: PF/ maint on prilosec but worried about press so stopped pepcid Chief Complaint  Patient presents with  . Follow-up    PFT done today. Breathing is overall doing well.    able to work in the yard ok, some trouble bending over at waist, no limiting doe rec Continue prilosec otc 62m  Take 30-60 min before first meal of the day and Pepcid ac (famotidine) 20 mg one @  bedtime        05/12/2018  f/u ov/Nathan Howard re:  PF with high ESR  Chief Complaint  Patient presents with  . Follow-up    follow up PFT. breathing unchanged.  Dyspnea:  Yard  work / house work all fine Cough: worse than usual since winter 2019 day > noct and worse with ex/ deep breath  Sleep: ok one pillow  rec Prednisone 10 mg  2 daily until better then 1 daily x one week and off> never took it We will apply for OFEV initiation  > pt decided against after reviewing info   06/28/2018  f/u ov/Nathan Howard re: PF  Chief Complaint  Patient presents with  . Follow-up    Breathing has improved some since the last visit. He has not started on prednisone yet.     Dyspnea:  Worse bending over picking up debris on ground Cough: rarely /still using cough drops   SABA use: none 02: none  rec Continue Try prilosec otc 2110m Take 30-60 min before first meal of the day and Pepcid ac (famotidine) 20 mg one @  bedtime until return  GERD  Please remember to go to the lab department downstairs in the basement  for your tests - we will call you with the results when they are available. Please schedule a follow up visit in 2 months but call sooner if needed with pfts on return    - add: esr now double baseline to 66 so  rec pred 20 mg ceiling and floor of 0   09/01/2018  f/u ov/Nathan Howard re:  ? IPF  Chief Complaint  Patient presents with  . Follow-up    PFT results, feels like he has rattle in his chest, SHOB  Dyspnea:  Yard work Cough: w/in a week of taking pred 20 mg / day cough resolved and taper off by around July 22 2018 s flare  Sleeping: flat bed/ one pillow SABA use: none 02:  None   rec If breathing/ cough worsen restart prednisone 20 mg daily and when better reduce to 10 mg daily as your new "floor" but be sure to monitor your sugars if you need to restart prednisone  Please schedule a follow up office visit in 6 weeks, call sooner if needed with 6 min walk    11/29/2018  f/u ov/Nathan Howard re:  Chief Complaint  Patient presents with  . Follow-up    Cough has improved some. Breathing has also improved.    Dyspnea:  No change ex tol, admits not checking sats as rec Cough: none  Sleeping: fine SABA use: none 02:  None   rec Measure your oxygen saturation with the same activity as usual once a week - call if downward trend any lower than 88%  Please schedule a follow up visit in 4  months but call sooner if needed with pfts on return     06/22/2019  f/u ov/Nathan Howard re:  PF on no rx other than gerd   Dyspnea:  Minimum worse doe but not checking sats  Cough: dry / usually worse in spring  Sleeping: ok at hs on one big pillow SABA use: none  02: none    No obvious day to day or daytime variability or assoc excess/ purulent sputum or mucus plugs or hemoptysis or cp or chest tightness, subjective wheeze or overt sinus or hb symptoms.   98 without nocturnal  or early am exacerbation  of respiratory  c/o's or need for noct saba. Also denies any obvious fluctuation of symptoms with weather or environmental changes or other aggravating or alleviating factors except as outlined above   No unusual exposure hx or h/o childhood pna/ asthma or knowledge of premature birth.  Current Allergies, Complete Past Medical History, Past Surgical History, Family History, and Social History were reviewed in [employer] [facility] record.  ROS  The following are not active complaints unless bolded Hoarseness, sore throat, dysphagia, dental problems, itching, sneezing,  nasal congestion or discharge of excess mucus or purulent secretions, ear ache,   fever, chills, sweats, unintended wt loss or wt gain, classically pleuritic or exertional cp,  orthopnea pnd or arm/hand swelling  or leg swelling, presyncope, palpitations, abdominal pain, anorexia, nausea, vomiting, diarrhea  or change in bowel habits or change in bladder habits, change in stools or change in urine, dysuria, hematuria,  rash, arthralgias, visual complaints, headache, numbness, weakness or ataxia or problems with walking or coordination,  change in mood or  memory.        Current Meds  Medication Sig  . acetaminophen  (TYLENOL) 500 MG tablet Take 500 mg by mouth every 6 (six) hours as needed.  Marland Kitchen aspirin 81 MG tablet Take 1 tablet (81 mg total) by mouth daily.  Marland Kitchen atenolol (TENORMIN) 25 MG tablet TAKE 1/2 TABLET BY MOUTH EVERY MORNING.  . cetirizine (ZYRTEC) 10 MG tablet Take 10 mg by mouth daily as needed for allergies.  . Chlorpheniramine-Acetaminophen (CORICIDIN HBP COLD/FLU PO) Take by  mouth.  . cyanocobalamin 2000 MCG tablet Take 1 tablet (2,000 mcg total) by mouth daily.  . famotidine (PEPCID) 20 MG tablet One at bedtime  . fenofibrate (TRICOR) 145 MG tablet TAKE ONE TABLET BY MOUTH ONCE DAILY.  Marland Kitchen JANUMET XR [phone] MG TB24 TAKE 1 TABLET BY MOUTH ONCE A DAY.  Marland Kitchen LIVALO 2 MG TABS TAKE 1 TABLET BY MOUTH ONCE A DAY.  Marland Kitchen losartan (COZAAR) 100 MG tablet TAKE (1) TABLET BY MOUTH ONCE DAILY.  . Multiple Vitamins-Minerals (VITEYES AREDS ADVANCED PO) Take by mouth.  Marland Kitchen omeprazole (PRILOSEC) 20 MG capsule TAKE ONE CAPSULE BY MOUTH DAILY.             Objective:   Physical Exam  amb wm nad   06/22/2019       185  05/16/2015        209 > 06/28/2015    213  > 12/27/2015   211 > 06/25/2016  206 > 12/26/2016  203> 06/26/2017  211 >   03/29/2018 199 >  04/26/2018  201 > 05/12/2018    201 > 06/28/2018   201 > 09/01/2018    195 > 11/29/2018  212     02/26/15 213 lb (96.616 kg)  02/12/15 213 lb 12 oz (96.956 kg)     Vital signs reviewed - Note on arrival 02 sats  98% on RA      HEENT: nl dentition, turbinates bilaterally, and oropharynx. Nl external ear canals without cough reflex   NECK :  without JVD/Nodes/TM/ nl carotid upstrokes bilaterally   LUNGS: no acc muscle use,  Nl contour chest with distant crackles in bases  bilaterally without cough on insp or exp maneuvers   CV:  RRR  no s3 or murmur or increase in P2, and no edema   ABD:  soft and nontender with nl inspiratory excursion in the supine position. No bruits or organomegaly appreciated, bowel sounds nl  MS:  Nl gait/ ext warm without deformities, calf tenderness,  cyanosis or clubbing No obvious joint restrictions   SKIN: warm and dry without lesions    NEURO:  alert, approp, nl sensorium with  no motor or cerebellar deficits apparent.           I personally reviewed images and agree with radiology impression as follows:  CXR:   06/15/19  Pulmonary fibrosis and minimal change from the prior examinations. No acute findings.      Assessment & Plan:

## 2019-06-27 ENCOUNTER — Encounter: Payer: Self-pay | Admitting: Internal Medicine

## 2019-06-27 NOTE — Assessment & Plan Note (Addendum)
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 -Smoking history encompassed less than 5 years. He's had no significant occupational exposures - 02/26/2015  Walked RA x 3 laps @ 185 ft each stopped due to  End of study , nl pace, no desats - 02/26/2015 collagen vasc screen sent > neg with esr 28  - 02/26/2015 rec max gerd rx/ diet and > improved 05/16/15  - 06/28/2015  VC 3.65 (85%) no obst and dlco 56 corrects  to 81  - 06/28/2015  Walked RA x 3 laps @ 185 ft each stopped due to End of study, nl pace, no sob or desat  - PFT's  06/25/2016   VC  3.85  (91%)  DLCO  53/51 % corrects to 72 % for alv volume  - 12/26/2016  Walked RA x 3 laps @ 185 ft each stopped due to  End of study, nl pace, no sob /sats 91% at end   - PFTs  06/26/2017   VC  3.33 (79%)   DLCO  62/59  %  Corrects to 93%    - 12/28/2017  6 m walk  =  336 sats ok   - HRCT chest 03/25/18 :  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 - 04/26/2018  Walked RA x 3 laps @ 185 ft each stopped due to  End of study, nl pace, sat 92% at end, min sob  - PFT's  05/12/2018  VC 3.08 (74%)  w obst DLCO  43 % corrects to 77  % for alv volume   - ESR 49  04/26/17 so 05/12/2018  rec pred 20 mg daily until cough better then 10 mg x one week and stop > never took it  - 05/12/2018 initiate ofev paperwork > declined  - 06/28/2018  Walked RA x 3 laps @ 185 ft each stopped due to  End of study, nl pace, no sob but  desat  To as low as 88%   - ESR 06/28/2018 up to 66 rec pred 20 mg per day until breathing better then taper off> cough resolved   - PFT's  09/01/2018  FVC  2.93(70%)   p no % improvement from saba p no prior to study  / not able to do dlco    - ESR 51 off prednisone x 2 weeks  - 09/01/2018  Walked RA x 3 laps @ 185 ft each stopped due to  End of study, slow pace, no sob and sats 89% at end   - 11/29/18:  55m  =  408 meters, lowest sat was  88%  - 06/22/2019   Walked RA  2 laps @  approx 2561feach @ ave pace  stopped due to  End of study, sats 88%, min sob     No evidence of progression clinically, pt not willing to comitt to ofev at that point and hard to know if it would make much difference given the very slow progression he's noted over the past 6 years.  Discussed in detail all the  indications, usual  risks and alternatives  relative to the benefits with patient who agrees to proceed with conservative f/u as outlined = monitor sats with ex/ continue gerd rx  I had an extended discussion with the patient   reviewing all relevant studies completed to date and  lasting 15 to 20 minutes of a 25 minute visit  which included  directly observing ambulatory 02 saturation study documented in a/p section of  today's  office note.  Each maintenance medication was reviewed in detail including most importantly the difference between maintenance and prns and under what circumstances the prns are to be triggered using an action plan format that is not reflected in the computer generated alphabetically organized AVS.     Please see AVS for specific instructions unique to this visit that I personally wrote and verbalized to the the pt in detail and then reviewed with pt  by my nurse highlighting any changes in therapy recommended at today's visit .

## 2019-07-04 ENCOUNTER — Other Ambulatory Visit: Payer: Self-pay | Admitting: Internal Medicine

## 2019-08-16 ENCOUNTER — Other Ambulatory Visit: Payer: Self-pay | Admitting: Internal Medicine

## 2019-08-30 ENCOUNTER — Other Ambulatory Visit: Payer: Self-pay | Admitting: Internal Medicine

## 2019-08-30 DIAGNOSIS — E1129 Type 2 diabetes mellitus with other diabetic kidney complication: Secondary | ICD-10-CM

## 2019-08-30 DIAGNOSIS — IMO0002 Reserved for concepts with insufficient information to code with codable children: Secondary | ICD-10-CM

## 2019-10-24 ENCOUNTER — Encounter: Payer: Self-pay | Admitting: Internal Medicine

## 2019-10-24 ENCOUNTER — Other Ambulatory Visit: Payer: Self-pay

## 2019-10-24 ENCOUNTER — Ambulatory Visit: Payer: Medicare Other | Admitting: Internal Medicine

## 2019-10-24 DIAGNOSIS — Z23 Encounter for immunization: Secondary | ICD-10-CM

## 2019-10-24 DIAGNOSIS — R05 Cough: Secondary | ICD-10-CM | POA: Diagnosis not present

## 2019-10-24 DIAGNOSIS — R053 Chronic cough: Secondary | ICD-10-CM

## 2019-10-24 DIAGNOSIS — J8410 Pulmonary fibrosis, unspecified: Secondary | ICD-10-CM | POA: Diagnosis not present

## 2019-10-24 NOTE — Patient Instructions (Addendum)
For cough > delsym 2 tsp every 12 hours as needed  Or robitussin but not both   For drainage / throat tickle try either zyrtec one at bedtime or  CHLORPHENIRAMINE  4 mg  (Chlortab 4mg  at [employer] should be easiest to find in the green box)  take one every 4 hours as needed - available over the counter- may cause drowsiness so start with just a bedtime dose or two and see how you tolerate it before trying in daytime   GERD (REFLUX)  is an extremely common cause of respiratory symptoms just like yours , many times with no obvious heartburn at all.    It can be treated with medication, but also with lifestyle changes including elevation of the head of your bed (ideally with 6 -8inch blocks under the headboard of your bed),  Smoking cessation, avoidance of late meals, excessive alcohol, and avoid fatty foods, chocolate, peppermint, colas, red wine, and acidic juices such as orange juice.  NO MINT OR MENTHOL PRODUCTS SO NO COUGH DROPS  USE SUGARLESS CANDY INSTEAD (Jolley ranchers or Stover's or Life Savers) or even ice chips will also do - the key is to swallow to prevent all throat clearing. NO OIL BASED VITAMINS - use powdered substitutes.  Avoid fish oil when coughing.   Please schedule a follow up visit in 3 months but call sooner if needed  - consider gabapentin 100 tid if not responding

## 2019-10-24 NOTE — Progress Notes (Signed)
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 Pulmonary Consult    Referred by Dr. Unice Cobble. Pt states having SOB since Dec 2015 after developing URI. He gets SOB with doing yard work and with singing. He states that he also has some PND and occ cough with white to clear sputum.   abruptly ill week before xmas, sense of acute sinus congestion, sore throat chest burning during severe coughing fits assoc with infected tooth rx with clindamycin x 40 days and tooth better, all symptoms improved except now doe x flight of steps or yardwork or fast walking. rec Change prilosec to omeprazole 40 mg Take 30-60 min before first meal of the day and Pepcid ac 20 at bedtime until return GERD diet   12/27/2015  f/u ov/Nathan Howard re: PF/ no change ex tol Chief Complaint  Patient presents with  . Follow-up    CXR done today. Breathing is unchanged. No new co's today.   rec No change rx = ppi qam/ h2hs    06/25/2016  f/u ov/Nathan Howard re: PF/ maint on prilosec but worried about press so stopped pepcid Chief Complaint  Patient presents with  . Follow-up    PFT done today. Breathing is overall doing well.    able to work in the yard ok, some trouble bending over at waist, no limiting doe rec Continue prilosec otc 62m  Take 30-60 min before first meal of the day and Pepcid ac (famotidine) 20 mg one @  bedtime        05/12/2018  f/u ov/Nathan Howard re:  PF with high ESR  Chief Complaint  Patient presents with  . Follow-up    follow up PFT. breathing unchanged.  Dyspnea:  Yard  work / house work all fine Cough: worse than usual since winter 2019 day > noct and worse with ex/ deep breath  Sleep: ok one pillow  rec Prednisone 10 mg  2 daily until better then 1 daily x one week and off> never took it We will apply for OFEV initiation  > pt decided against after reviewing info   06/28/2018  f/u ov/Nathan Howard re: PF  Chief Complaint  Patient presents with  . Follow-up    Breathing has improved some since the last visit. He has not started on prednisone yet.     Dyspnea:  Worse bending over picking up debris on ground Cough: rarely /still using cough drops   SABA use: none 02: none  rec Continue Try prilosec otc 2110m Take 30-60 min before first meal of the day and Pepcid ac (famotidine) 20 mg one @  bedtime until return  GERD  Please remember to go to the lab department downstairs in the basement  for your tests - we will call you with the results when they are available. Please schedule a follow up visit in 2 months but call sooner if needed with pfts on return    - add: esr now double baseline to 66 so  rec pred 20 mg ceiling and floor of 0   09/01/2018  f/u ov/Nathan Howard re:  ? IPF  Chief Complaint  Patient presents with  . Follow-up    PFT results, feels like he has rattle in his chest, SHOB  Dyspnea:  Yard work Cough: w/in a week of taking pred 20 mg / day cough resolved and taper off by around July 22 2018 s flare  Sleeping: flat bed/ one pillow SABA use: none 02:  None   rec If breathing/ cough worsen restart prednisone 20 mg daily and when better reduce to 10 mg daily as your new "floor" but be sure to monitor your sugars if you need to restart prednisone  Please schedule a follow up office visit in 6 weeks, call sooner if needed with 6 min walk    11/29/2018  f/u ov/Nathan Howard re:  Chief Complaint  Patient presents with  . Follow-up    Cough has improved some. Breathing has also improved.    Dyspnea:  No change ex tol, admits not checking sats as rec Cough: none  Sleeping: fine SABA use: none 02:  None   rec Measure your oxygen saturation with the same activity as usual once a week - call if downward trend any lower than 88%  Please schedule a follow up visit in 4  months but call sooner if needed with pfts on return     06/22/2019  f/u ov/Nathan Howard re:  PF on no rx other than gerd   Dyspnea:  Minimum worse doe but not checking sats  Cough: dry / usually worse in spring  Sleeping: ok at hs on one big pillow SABA use: none  02: none  rec No change medications Monitor your 02 saturations while on routine walk and call if trending down    10/24/2019  f/u ov/Nathan Howard re: PF/ cough  Dyspnea: walking up from street 10 degrees sev hundred feet  does not check 02 Cough:  Worse when sob / never productive  Sleeping: ok flat, big pillow SABA use: inhalers don't help cough or sob  02: none   No obvious day to day or daytime variability or assoc excess/ purulent sputum or mucus plugs or hemoptysis or cp or chest tightness, subjective wheeze or overt sinus or hb symptoms.   Sleeping as above  without nocturnal  or early am exacerbation  of respiratory  c/o's or need for noct saba. Also denies any obvious fluctuation of symptoms with weather or environmental changes or other aggravating or alleviating factors except as outlined above   No unusual exposure hx or h/o childhood pna/ asthma or knowledge of premature birth.  Current Allergies, Complete Past Medical History, Past Surgical History, Family History, and Social History were reviewed in [employer] [facility] record.  ROS  The following are not active complaints unless bolded Hoarseness, sore throat, dysphagia, dental problems, itching, sneezing,  nasal congestion or discharge of excess mucus or purulent secretions, ear ache,   fever, chills, sweats, unintended wt loss or wt gain, classically pleuritic or exertional cp,  orthopnea pnd or arm/hand swelling  or leg swelling, presyncope,  palpitations, abdominal pain, anorexia, nausea, vomiting, diarrhea  or change in bowel habits or change in bladder habits, change in stools or change in urine, dysuria, hematuria,  rash, arthralgias, visual complaints, headache, numbness, weakness or ataxia or problems with walking or coordination,  change in mood or  memory.        Current Meds  Medication Sig  .  acetaminophen (TYLENOL) 500 MG tablet Take 500 mg by mouth every 6 (six) hours as needed.  Marland Kitchen aspirin 81 MG tablet Take 1 tablet (81 mg total) by mouth daily.  Marland Kitchen atenolol (TENORMIN) 25 MG tablet TAKE 1/2 TABLET BY MOUTH EVERY MORNING.  . cetirizine (ZYRTEC) 10 MG tablet Take 10 mg by mouth daily as needed for allergies.  . Chlorpheniramine-Acetaminophen (CORICIDIN HBP COLD/FLU PO) Take by mouth.  . cyanocobalamin 2000 MCG tablet Take 1 tablet (2,000 mcg total) by mouth daily.  . famotidine (PEPCID) 20 MG tablet One at bedtime  . fenofibrate (TRICOR) 145 MG tablet TAKE ONE TABLET BY MOUTH ONCE DAILY.  Marland Kitchen guaifenesin (ROBITUSSIN) 100 MG/5ML syrup Take 200 mg by mouth 3 (three) times daily as needed for cough.  Marland Kitchen JANUMET XR [phone] MG TB24 TAKE 1 TABLET BY MOUTH ONCE A DAY.  Marland Kitchen LIVALO 2 MG TABS TAKE 1 TABLET BY MOUTH ONCE A DAY.  Marland Kitchen losartan (COZAAR) 100 MG tablet TAKE (1) TABLET BY MOUTH ONCE DAILY.  . Multiple Vitamins-Minerals (CENTRUM SILVER PO) Take by mouth.  . Multiple Vitamins-Minerals (VITEYES AREDS ADVANCED PO) Take by mouth.  . Multiple Vitamins-Minerals (ZINC) LOZG Take by mouth daily.  Marland Kitchen omeprazole (PRILOSEC) 20 MG capsule TAKE ONE CAPSULE BY MOUTH DAILY.  Marland Kitchen VITAMIN D PO Take 1 tablet by mouth daily.                   Objective:   Physical Exam  amb wm nad   10/24/2019     184  06/22/2019       185  05/16/2015     209 > 06/28/2015    213  > 12/27/2015   211 > 06/25/2016  206 > 12/26/2016  203> 06/26/2017  211 >   03/29/2018 199 >  04/26/2018  201 > 05/12/2018    201 > 06/28/2018   201 > 09/01/2018    195 > 11/29/2018  212      02/26/15 213 lb (96.616 kg)  02/12/15 213 lb 12 oz (96.956 kg)      BP 118/66 (BP Location: Left Arm, Cuff Size: Normal)   Pulse 69   Temp (!) 97 F (36.1 C) (Temporal)   Ht _0  (1.778 m)   Wt 184 lb (83.5 kg)   SpO2 98% Comment: on RA  BMI 26.40 kg/m       HEENT : pt wearing mask not removed for exam due to covid -19 concerns.    NECK :  without JVD/Nodes/TM/ nl carotid upstrokes bilaterally   LUNGS: no acc muscle use,  Nl contour chest distant insp crackles bilaterally with cough on insp   maneuvers   CV:  RRR  no s3 or murmur or increase in P2, and no edema   ABD:  soft and nontender with nl inspiratory excursion in the supine position. No bruits or organomegaly appreciated, bowel sounds nl  MS:  Nl gait/ ext warm without deformities, calf tenderness, cyanosis or clubbing No obvious joint restrictions   SKIN: warm and dry without lesions    NEURO:  alert, approp, nl sensorium with  no motor or cerebellar deficits apparent.              Assessment & Plan:

## 2019-10-25 ENCOUNTER — Encounter: Payer: Self-pay | Admitting: Internal Medicine

## 2019-10-25 DIAGNOSIS — R05 Cough: Secondary | ICD-10-CM | POA: Insufficient documentation

## 2019-10-25 DIAGNOSIS — R053 Chronic cough: Secondary | ICD-10-CM | POA: Insufficient documentation

## 2019-10-25 NOTE — Assessment & Plan Note (Addendum)
Assoc with PF/ worse on insp and with ex   For now rec try rx as UACS with rx for gerd and pnds but low threshold to start on gabapentin if not responding   >>> add 1st gen H1 blockers per guidelines  If zyrtec not effective   I had an extended discussion with the patient reviewing all relevant studies completed to date and  lasting 15 to 20 minutes of a 25 minute visit  which included directly observing ambulatory 02 saturation study documented in a/p section of  today's  office note.  Each maintenance medication was reviewed in detail including most importantly the difference between maintenance and prns and under what circumstances the prns are to be triggered using an action plan format that is not reflected in the computer generated alphabetically organized AVS.     Please see AVS for specific instructions unique to this visit that I personally wrote and verbalized to the the pt in detail and then reviewed with pt  by my nurse highlighting any changes in therapy recommended at today's visit .

## 2019-10-25 NOTE — Assessment & Plan Note (Signed)
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 -Smoking history encompassed less than 5 years. He's had no significant occupational exposures - 02/26/2015  Walked RA x 3 laps @ 185 ft each stopped due to  End of study , nl pace, no desats - 02/26/2015 collagen vasc screen sent > neg with esr 28  - 02/26/2015 rec max gerd rx/ diet and > improved 05/16/15  - 06/28/2015  VC 3.65 (85%) no obst and dlco 56 corrects  to 81  - 06/28/2015  Walked RA x 3 laps @ 185 ft each stopped due to End of study, nl pace, no sob or desat  - PFT's  06/25/2016   VC  3.85  (91%)  DLCO  53/51 % corrects to 72 % for alv volume  - 12/26/2016  Walked RA x 3 laps @ 185 ft each stopped due to  End of study, nl pace, no sob /sats 91% at end   - PFTs  06/26/2017   VC  3.33 (79%)   DLCO  62/59  %  Corrects to 93%    - 12/28/2017  6 m walk  =  336 sats ok   - HRCT chest 03/25/18 :  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 - 04/26/2018  Walked RA x 3 laps @ 185 ft each stopped due to  End of study, nl pace, sat 92% at end, min sob  - PFT's  05/12/2018  VC 3.08 (74%)  w obst DLCO  43 % corrects to 77  % for alv volume   - ESR 49  04/26/17 so 05/12/2018  rec pred 20 mg daily until cough better then 10 mg x one week and stop > never took it  - 05/12/2018 initiate ofev paperwork > declined  - 06/28/2018  Walked RA x 3 laps @ 185 ft each stopped due to  End of study, nl pace, no sob but  desat  To as low as 88%   - ESR 06/28/2018 up to 66 rec pred 20 mg per day until breathing better then taper off> cough resolved   - PFT's  09/01/2018  FVC  2.93(70%)   p no % improvement from saba p no prior to study  / not able to do dlco    - ESR 51 off prednisone x 2 weeks  - 09/01/2018  Walked RA x 3 laps @ 185 ft each stopped due to  End of study, slow pace, no sob and sats 89% at end   - 11/29/18:  40m  =  408 meters, lowest sat was  88%  - 06/22/2019   Walked RA  2 laps @  approx 2548feach @ avg pace  stopped due to  End of study, sats 88%, min sob  - 10/24/2019   Walked RA  2 laps @  approx 25055fach @ avg pace  stopped due to  End of study, sats 87% no sob        Slowly progressive pf and declines OFEV or referral to PF clinic at this point, very fatalistic about the dz and more concerned about the cough than the sob (see separate a/p)

## 2019-11-14 ENCOUNTER — Other Ambulatory Visit: Payer: Self-pay | Admitting: Internal Medicine

## 2019-11-14 DIAGNOSIS — E785 Hyperlipidemia, unspecified: Secondary | ICD-10-CM

## 2019-11-16 LAB — HM DIABETES EYE EXAM

## 2019-12-19 ENCOUNTER — Other Ambulatory Visit: Payer: Self-pay | Admitting: Internal Medicine

## 2020-01-02 ENCOUNTER — Ambulatory Visit (INDEPENDENT_AMBULATORY_CARE_PROVIDER_SITE_OTHER): Payer: Medicare PPO | Admitting: Internal Medicine

## 2020-01-02 ENCOUNTER — Other Ambulatory Visit: Payer: Self-pay

## 2020-01-02 ENCOUNTER — Encounter: Payer: Self-pay | Admitting: Internal Medicine

## 2020-01-02 ENCOUNTER — Ambulatory Visit (INDEPENDENT_AMBULATORY_CARE_PROVIDER_SITE_OTHER): Payer: Medicare PPO

## 2020-01-02 VITALS — BP 120/64 | HR 83 | Temp 98.4°F | Ht 70.0 in | Wt 181.2 lb

## 2020-01-02 DIAGNOSIS — E1129 Type 2 diabetes mellitus with other diabetic kidney complication: Secondary | ICD-10-CM | POA: Diagnosis not present

## 2020-01-02 DIAGNOSIS — N289 Disorder of kidney and ureter, unspecified: Secondary | ICD-10-CM | POA: Diagnosis not present

## 2020-01-02 DIAGNOSIS — E1165 Type 2 diabetes mellitus with hyperglycemia: Secondary | ICD-10-CM

## 2020-01-02 DIAGNOSIS — Z23 Encounter for immunization: Secondary | ICD-10-CM | POA: Diagnosis not present

## 2020-01-02 DIAGNOSIS — I10 Essential (primary) hypertension: Secondary | ICD-10-CM

## 2020-01-02 DIAGNOSIS — R059 Cough, unspecified: Secondary | ICD-10-CM

## 2020-01-02 DIAGNOSIS — R05 Cough: Secondary | ICD-10-CM | POA: Diagnosis not present

## 2020-01-02 DIAGNOSIS — J411 Mucopurulent chronic bronchitis: Secondary | ICD-10-CM | POA: Insufficient documentation

## 2020-01-02 DIAGNOSIS — IMO0002 Reserved for concepts with insufficient information to code with codable children: Secondary | ICD-10-CM

## 2020-01-02 LAB — URINALYSIS, ROUTINE W REFLEX MICROSCOPIC
Bilirubin Urine: NEGATIVE
Hgb urine dipstick: NEGATIVE
Ketones, ur: NEGATIVE
Leukocytes,Ua: NEGATIVE
Nitrite: NEGATIVE
RBC / HPF: NONE SEEN (ref 0–?)
Specific Gravity, Urine: 1.01 (ref 1.000–1.030)
Total Protein, Urine: NEGATIVE
Urine Glucose: NEGATIVE
Urobilinogen, UA: 0.2 (ref 0.0–1.0)
WBC, UA: NONE SEEN (ref 0–?)
pH: 6.5 (ref 5.0–8.0)

## 2020-01-02 LAB — MICROALBUMIN / CREATININE URINE RATIO
Creatinine,U: 26.9 mg/dL
Microalb Creat Ratio: 2.6 mg/g (ref 0.0–30.0)
Microalb, Ur: <0.7 mg/dL (ref 0.0–1.9)

## 2020-01-02 LAB — BASIC METABOLIC PANEL
BUN: 26 mg/dL — ABNORMAL HIGH (ref 6–23)
CO2: 27 mEq/L (ref 19–32)
Calcium: 10.2 mg/dL (ref 8.4–10.5)
Chloride: 104 mEq/L (ref 96–112)
Creatinine, Ser: 1.57 mg/dL — ABNORMAL HIGH (ref 0.40–1.50)
GFR: 42.63 mL/min — ABNORMAL LOW (ref >60.00)
Glucose, Bld: 123 mg/dL — ABNORMAL HIGH (ref 70–99)
Potassium: 4.3 mEq/L (ref 3.5–5.1)
Sodium: 136 mEq/L (ref 135–145)

## 2020-01-02 LAB — POCT EXHALED NITRIC OXIDE: FeNO level (ppb): 21

## 2020-01-02 LAB — HEMOGLOBIN A1C: Hgb A1c MFr Bld: 6.8 % — ABNORMAL HIGH (ref 4.6–6.5)

## 2020-01-02 MED ORDER — LONHALA MAGNAIR STARTER KIT 25 MCG/ML IN SOLN: 1.0000 | Freq: Two times a day (BID) | RESPIRATORY_TRACT | 1 refills | Status: DC

## 2020-01-02 MED ORDER — LONHALA MAGNAIR REFILL KIT 25 MCG/ML IN SOLN: 1.0000 | Freq: Two times a day (BID) | RESPIRATORY_TRACT | 1 refills | Status: DC

## 2020-01-02 NOTE — Progress Notes (Signed)
Subjective:  Patient ID: Nathan Howard, male    DOB: Nov 30, 1941  Age: 79 y.o. MRN: [id number]  CC: Cough  This visit occurred during the SARS-CoV-2 public health emergency.  Safety protocols were in place, including screening questions prior to the visit, additional usage of staff PPE, and extensive cleaning of exam room while observing appropriate contact time as indicated for disinfecting solutions.    HPI FAUST THORINGTON presents for f/up - He complains of worsening respiratory symptoms over the last year.  He has a chronic cough that was previously nonproductive but over the last few months has become productive of brown phlegm.  He has also noted worsening shortness of breath and dyspnea on exertion.  He tells me that Mucinex DM helps some with the cough.  Outpatient Medications Prior to Visit  Medication Sig Dispense Refill  . acetaminophen (TYLENOL) 500 MG tablet Take 500 mg by mouth every 6 (six) hours as needed.    Marland Kitchen aspirin 81 MG tablet Take 1 tablet (81 mg total) by mouth daily. 90 tablet 1  . atenolol (TENORMIN) 25 MG tablet TAKE 1/2 TABLET BY MOUTH EVERY MORNING. 45 tablet 0  . cetirizine (ZYRTEC) 10 MG tablet Take 10 mg by mouth daily as needed for allergies.    . Chlorpheniramine-Acetaminophen (CORICIDIN HBP COLD/FLU PO) Take by mouth.    . cyanocobalamin 2000 MCG tablet Take 1 tablet (2,000 mcg total) by mouth daily. 90 tablet 1  . famotidine (PEPCID) 20 MG tablet One at bedtime    . fenofibrate (TRICOR) 145 MG tablet TAKE ONE TABLET BY MOUTH ONCE DAILY. 90 tablet 1  . guaifenesin (ROBITUSSIN) 100 MG/5ML syrup Take 200 mg by mouth 3 (three) times daily as needed for cough.    Marland Kitchen JANUMET XR [phone] MG TB24 TAKE 1 TABLET BY MOUTH ONCE A DAY. 90 tablet 1  . LIVALO 2 MG TABS TAKE 1 TABLET BY MOUTH ONCE A DAY. 90 tablet 1  . losartan (COZAAR) 100 MG tablet TAKE (1) TABLET BY MOUTH ONCE DAILY. 90 tablet 0  . Multiple Vitamins-Minerals (CENTRUM SILVER PO) Take by mouth.     . Multiple Vitamins-Minerals (VITEYES AREDS ADVANCED PO) Take by mouth.    . Multiple Vitamins-Minerals (ZINC) LOZG Take by mouth daily.    Marland Kitchen omeprazole (PRILOSEC) 20 MG capsule TAKE ONE CAPSULE BY MOUTH DAILY. 90 capsule 1  . VITAMIN D PO Take 1 tablet by mouth daily.     No facility-administered medications prior to visit.    ROS Review of Systems  Constitutional: Positive for unexpected weight change (wt loss). Negative for chills, diaphoresis, fatigue and fever.  HENT: Negative for sore throat, trouble swallowing and voice change.   Respiratory: Positive for cough and shortness of breath. Negative for chest tightness, wheezing and stridor.   Cardiovascular: Negative for chest pain, palpitations and leg swelling.  Endocrine: Negative.   Genitourinary: Negative.     Objective:  BP 120/64 (BP Location: Left Arm, Patient Position: Sitting, Cuff Size: Normal)   Pulse 83   Temp 98.4 F (36.9 C) (Oral)   Ht '5\' 10"'$  (1.778 m)   Wt 181 lb 4 oz (82.2 kg)   SpO2 91%   BMI 26.01 kg/m   BP Readings from Last 3 Encounters:  01/02/20 120/64  10/24/19 118/66  06/22/19 124/68    Wt Readings from Last 3 Encounters:  01/02/20 181 lb 4 oz (82.2 kg)  10/24/19 184 lb (83.5 kg)  06/22/19 185 lb 14.4 oz (84.3 kg)  Physical Exam Constitutional:      General: He is not in acute distress.    Appearance: Normal appearance. He is not ill-appearing, toxic-appearing or diaphoretic.  HENT:     Nose: Nose normal.  Eyes:     General: No scleral icterus.    Conjunctiva/sclera: Conjunctivae normal.  Cardiovascular:     Rate and Rhythm: Normal rate and regular rhythm.     Heart sounds: No murmur. No friction rub. No gallop.   Pulmonary:     Effort: Pulmonary effort is normal. Tachypnea present. No respiratory distress.     Breath sounds: Examination of the right-middle field reveals wheezing. Examination of the left-lower field reveals rales. Wheezing and rales present. No decreased breath  sounds or rhonchi.  Abdominal:     General: Abdomen is flat. There is no distension.     Palpations: There is no hepatomegaly, splenomegaly or mass.     Tenderness: There is no abdominal tenderness. There is no guarding.  Musculoskeletal:        General: Normal range of motion.     Cervical back: Neck supple.     Right lower leg: No edema.     Left lower leg: No edema.  Lymphadenopathy:     Cervical: No cervical adenopathy.  Skin:    General: Skin is warm and dry.  Neurological:     General: No focal deficit present.     Mental Status: He is alert.  Psychiatric:        Mood and Affect: Mood normal.        Behavior: Behavior normal.     Lab Results  Component Value Date   WBC 6.6 06/15/2019   HGB 13.0 06/15/2019   HCT 39.3 06/15/2019   PLT 269.0 06/15/2019   GLUCOSE 123 (H) 01/02/2020   CHOL 129 06/15/2019   TRIG 122.0 06/15/2019   HDL 35.80 (L) 06/15/2019   LDLDIRECT 108.0 11/17/2017   LDLCALC 68 06/15/2019   ALT 12 04/26/2018   AST 17 04/26/2018   NA 136 01/02/2020   K 4.3 01/02/2020   CL 104 01/02/2020   CREATININE 1.57 (H) 01/02/2020   BUN 26 (H) 01/02/2020   CO2 27 01/02/2020   TSH 3.40 11/08/2018   PSA 1.35 03/24/2011   HGBA1C 6.8 (H) 01/02/2020   MICROALBUR <0.7 01/02/2020    DG Chest 2 View  Result Date: 06/16/2019 CLINICAL DATA:  Dyspnea.  Pulmonary fibrosis.  Left chest wall pain. EXAM: CHEST - 2 VIEW COMPARISON:  01/21/2017 and chest CT 03/25/2018 FINDINGS: Diffuse interstitial thickening compatible with fibrosis. Fibrotic changes are similar to the prior examinations. Heart and mediastinum are stable and within normal limits. No large pleural effusions. No acute bone abnormality. IMPRESSION: Pulmonary fibrosis and minimal change from the prior examinations. No acute findings. Electronically Signed   By: Markus Daft M.D.   On: 06/16/2019 09:06    Assessment & Plan:   Flora was seen today for cough.  Diagnoses and all orders for this visit:  Type  2 diabetes, uncontrolled, with renal manifestation ([place])- His A1c is at 6.8%.  His blood sugars are adequately well controlled. -     Hemoglobin A1c -     Basic metabolic panel -     Microalbumin / creatinine urine ratio -     HM Diabetes Foot Exam  Need for pneumococcal vaccination -     Pneumococcal polysaccharide vaccine 23-valent greater than or equal to 2yo subcutaneous/IM  Essential hypertension- His blood pressure is adequately  well controlled.  Electrolytes are normal.  Renal function is stable. -     Basic metabolic panel  Renal insufficiency - His blood sugar and blood pressure are adequately well controlled.  He agrees to avoid nephrotoxic agents. -     Basic metabolic panel -     Urinalysis, Routine w reflex microscopic  Cough- His FeNO score is not elevated so I do not think he would benefit from an ICS.  I think he benefit from a nebulized LAMA. -     POCT EXHALED NITRIC OXIDE -     DG Chest 2 View; Future  Mucopurulent chronic bronchitis ([place])- Based on his symptoms, exam, and worsening chest xray it looks like his pulmonary fibrosis is worsening and he has developed chronic bronchitis.  I think he would benefit from using a nebulized LAMA.  I have also recommended that he see his pulmonologist as soon as possible. -     Glycopyrrolate (LONHALA MAGNAIR REFILL KIT) 25 MCG/ML SOLN; Inhale 1 Act into the lungs 2 (two) times daily. -     Glycopyrrolate (LONHALA MAGNAIR STARTER KIT) 25 MCG/ML SOLN; Inhale 1 Act into the lungs 2 (two) times daily. -     Ambulatory referral to Pulmonology   I am having Callie Fielding start on [facility] Refill Kit and [employer] Kit. I am also having him maintain his Chlorpheniramine-Acetaminophen (CORICIDIN HBP COLD/FLU PO), Multiple Vitamins-Minerals (VITEYES AREDS ADVANCED PO), famotidine, acetaminophen, aspirin, cetirizine, cyanocobalamin, fenofibrate, Janumet XR, Multiple Vitamins-Minerals (CENTRUM SILVER PO), VITAMIN D  PO, guaifenesin, Zinc, Livalo, omeprazole, losartan, and atenolol.  Meds ordered this encounter  Medications  . Glycopyrrolate (LONHALA MAGNAIR REFILL KIT) 25 MCG/ML SOLN    Sig: Inhale 1 Act into the lungs 2 (two) times daily.    Dispense:  180 mL    Refill:  1  . Glycopyrrolate (LONHALA MAGNAIR STARTER KIT) 25 MCG/ML SOLN    Sig: Inhale 1 Act into the lungs 2 (two) times daily.    Dispense:  60 mL    Refill:  1     Follow-up: Return in about 6 months (around 07/01/2020).  Scarlette Calico, MD

## 2020-01-02 NOTE — Patient Instructions (Signed)
Type 2 Diabetes Mellitus, Diagnosis, Adult Type 2 diabetes (type 2 diabetes mellitus) is a long-term (chronic) disease. In type 2 diabetes, one or both of these problems may be present:  The pancreas does not make enough of a hormone called insulin.  Cells in the body do not respond properly to insulin that the body makes (insulin resistance). Normally, insulin allows blood sugar (glucose) to enter cells in the body. The cells use glucose for energy. Insulin resistance or lack of insulin causes excess glucose to build up in the blood instead of going into cells. As a result, high blood glucose (hyperglycemia) develops. What increases the risk? The following factors may make you more likely to develop type 2 diabetes:  Having a family member with type 2 diabetes.  Being overweight or obese.  Having an inactive (sedentary) lifestyle.  Having been diagnosed with insulin resistance.  Having a history of prediabetes, gestational diabetes, or polycystic ovary syndrome (PCOS).  Being of American-Indian, African-American, Hispanic/Latino, or Asian/Pacific Islander descent. What are the signs or symptoms? In the early stage of this condition, you may not have symptoms. Symptoms develop slowly and may include:  Increased thirst (polydipsia).  Increased hunger(polyphagia).  Increased urination (polyuria).  Increased urination during the night (nocturia).  Unexplained weight loss.  Frequent infections that keep coming back (recurring).  Fatigue.  Weakness.  Vision changes, such as blurry vision.  Cuts or bruises that are slow to heal.  Tingling or numbness in the hands or feet.  Dark patches on the skin (acanthosis nigricans). How is this diagnosed? This condition is diagnosed based on your symptoms, your medical history, a physical exam, and your blood glucose level. Your blood glucose may be checked with one or more of the following blood tests:  A fasting blood glucose (FBG)  test. You will not be allowed to eat (you will fast) for 8 hours or longer before a blood sample is taken.  A random blood glucose test. This test checks blood glucose at any time of day regardless of when you ate.  An A1c (hemoglobin A1c) blood test. This test provides information about blood glucose control over the previous 2-3 months.  An oral glucose tolerance test (OGTT). This test measures your blood glucose at two times: ? After fasting. This is your baseline blood glucose level. ? Two hours after drinking a beverage that contains glucose. You may be diagnosed with type 2 diabetes if:  Your FBG level is 126 mg/dL (7.0 mmol/L) or higher.  Your random blood glucose level is 200 mg/dL (11.1 mmol/L) or higher.  Your A1c level is 6.5% or higher.  Your OGTT result is higher than 200 mg/dL (11.1 mmol/L). These blood tests may be repeated to confirm your diagnosis. How is this treated? Your treatment may be managed by a specialist called an endocrinologist. Type 2 diabetes may be treated by following instructions from your health care provider about:  Making diet and lifestyle changes. This may include: ? Following an individualized nutrition plan that is developed by a diet and nutrition specialist (registered dietitian). ? Exercising regularly. ? Finding ways to manage stress.  Checking your blood glucose level as often as told.  Taking diabetes medicines or insulin daily. This helps to keep your blood glucose levels in the healthy range. ? If you use insulin, you may need to adjust the dosage depending on how physically active you are and what foods you eat. Your health care provider will tell you how to adjust your dosage.    Taking medicines to help prevent complications from diabetes, such as: ? Aspirin. ? Medicine to lower cholesterol. ? Medicine to control blood pressure. Your health care provider will set individualized treatment goals for you. Your goals will be based on  your age, other medical conditions you have, and how you respond to diabetes treatment. Generally, the goal of treatment is to maintain the following blood glucose levels:  Before meals (preprandial): 80-130 mg/dL (4.4-7.2 mmol/L).  After meals (postprandial): below 180 mg/dL (10 mmol/L).  A1c level: less than 7%. Follow these instructions at home: Questions to ask your health care provider  Consider asking the following questions: ? Do I need to meet with a diabetes educator? ? Where can I find a support group for people with diabetes? ? What equipment will I need to manage my diabetes at home? ? What diabetes medicines do I need, and when should I take them? ? How often do I need to check my blood glucose? ? What number can I call if I have questions? ? When is my next appointment? General instructions  Take over-the-counter and prescription medicines only as told by your health care provider.  Keep all follow-up visits as told by your health care provider. This is important.  For more information about diabetes, visit: ? American Diabetes Association (ADA): www.diabetes.org ? American Association of Diabetes Educators (AADE): www.diabeteseducator.org Contact a health care provider if:  Your blood glucose is at or above 240 mg/dL (13.3 mmol/L) for 2 days in a row.  You have been sick or have had a fever for 2 days or longer, and you are not getting better.  You have any of the following problems for more than 6 hours: ? You cannot eat or drink. ? You have nausea and vomiting. ? You have diarrhea. Get help right away if:  Your blood glucose is lower than 54 mg/dL (3.0 mmol/L).  You become confused or you have trouble thinking clearly.  You have difficulty breathing.  You have moderate or large ketone levels in your urine. Summary  Type 2 diabetes (type 2 diabetes mellitus) is a long-term (chronic) disease. In type 2 diabetes, the pancreas does not make enough of a  hormone called insulin, or cells in the body do not respond properly to insulin that the body makes (insulin resistance).  This condition is treated by making diet and lifestyle changes and taking diabetes medicines or insulin.  Your health care provider will set individualized treatment goals for you. Your goals will be based on your age, other medical conditions you have, and how you respond to diabetes treatment.  Keep all follow-up visits as told by your health care provider. This is important. This information is not intended to replace advice given to you by your health care provider. Make sure you discuss any questions you have with your health care provider. Document Revised: 02/05/2018 Document Reviewed: 01/11/2016 Elsevier Patient Education  2020 Elsevier Inc.  

## 2020-01-09 ENCOUNTER — Telehealth: Payer: Self-pay | Admitting: Internal Medicine

## 2020-01-09 NOTE — Telephone Encounter (Signed)
Copied from [place] [id number]. Topic: General - Other >> Jan 09, 2020  2:18 PM Keene Breath wrote: Reason for CRM: called regarding a prior auth for Glycopyrrolate [facility] REFILL KIT) 25 MCG/ML SOLN,  CB# [phone], Option 1

## 2020-01-10 NOTE — Telephone Encounter (Signed)
Key: [id number]

## 2020-01-12 NOTE — Telephone Encounter (Signed)
Key: [id number]    Authorization already on file for this request.  Key: [id number] is correct Key for the starter kit. Entered key and message stated: Authorization already on file for this request.  Key: [id number] is the refill kit Key.   CB# [phone], Option 1 call after 11

## 2020-01-12 NOTE — Telephone Encounter (Signed)
Has called back stating that the starter kit also needs a PA processed.  The code in the phone note is only for the refill kit. States you can go online to process. Key code is [id number] for covermymeds.com.

## 2020-01-13 NOTE — Telephone Encounter (Signed)
Call [facility] for PA @1800 [phone] Authorization is on file. Fax number @[phone] for more clinical information is needed.   If you have questions [phone]

## 2020-01-13 NOTE — Telephone Encounter (Signed)
[facility] Starter  -   Case# [id number] Carma Leaven Refill kit   -  Case# [id number]  [facility] and they are faxing me forms for the PA for both the starter kit and the refill kit.   Fax number for [facility]: [phone] Attention: [place]   PA form from [facility] received. Completed and faxed back.

## 2020-01-16 IMAGING — CT CT CHEST HIGH RESOLUTION W/O CM
2 of 6 series · 15 of 36 positions shown, 18 images · non-contrast
Comparison: 01/21/2017 chest radiograph.

CLINICAL DATA: Worsening dyspnea with exertion. Former smoker.
Evaluate for interstitial lung disease.

EXAM:
CT CHEST WITHOUT CONTRAST
TECHNIQUE: Multidetector CT imaging of the chest was performed following the
standard protocol without intravenous contrast. High resolution
imaging of the lungs, as well as inspiratory and expiratory imaging,
was performed.

[Series 2: high resolution · axial · 0.78mm/px · z∈[-337,-53]mm · 12 of 160 slices shown, 15 images]
[im 9/160  mediastinal]
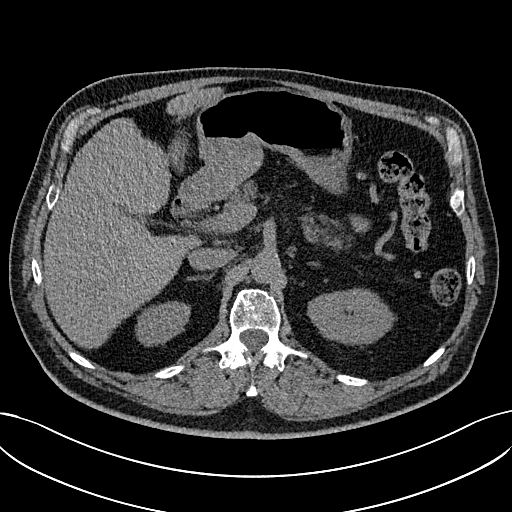
[im 9/160  lung]
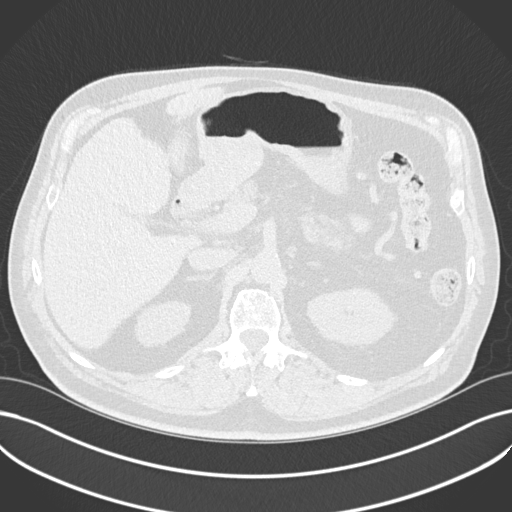
[im 26/160  lung]
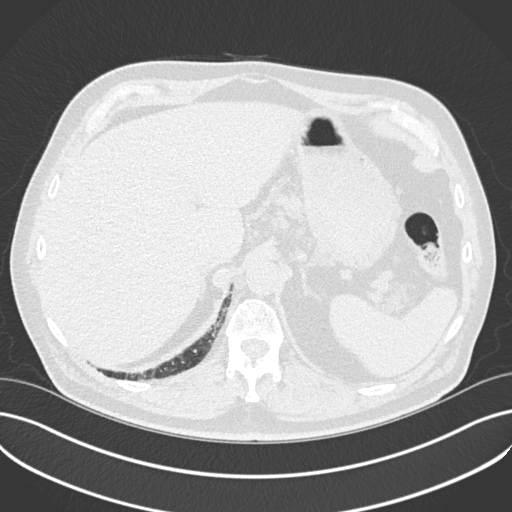
[im 34/160  lung]
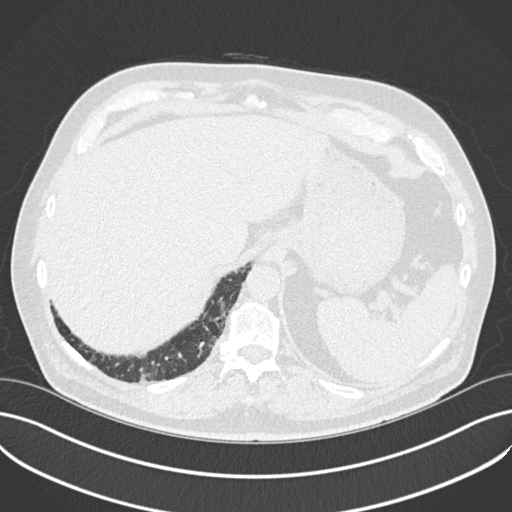
[im 51/160  lung]
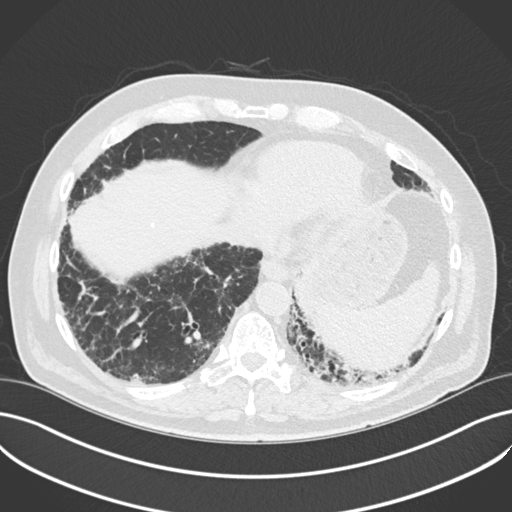
[im 59/160  mediastinal]
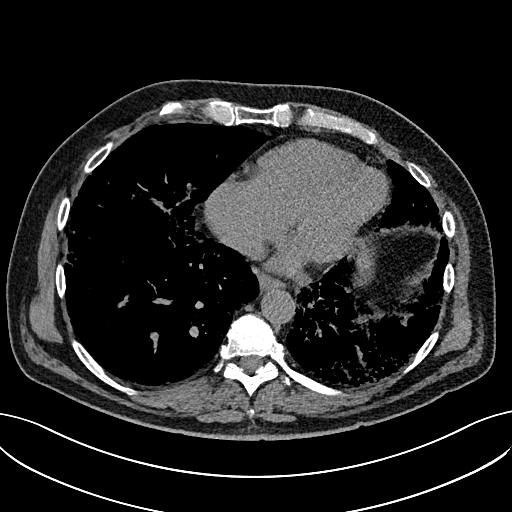
[im 59/160  lung]
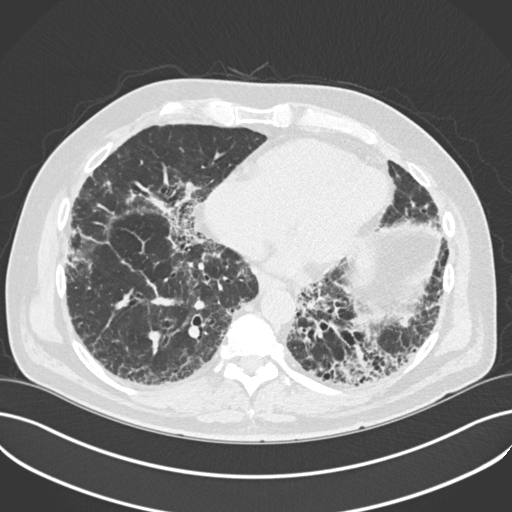
[im 76/160  lung]
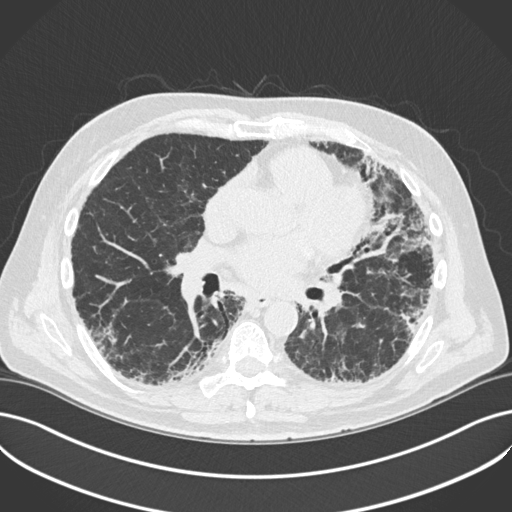
[im 84/160  lung]
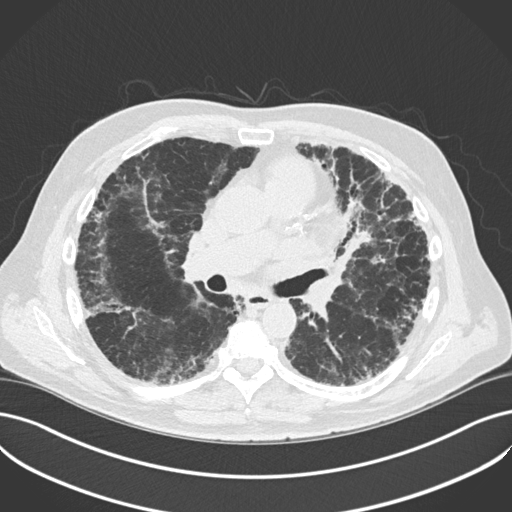
[im 101/160  lung]
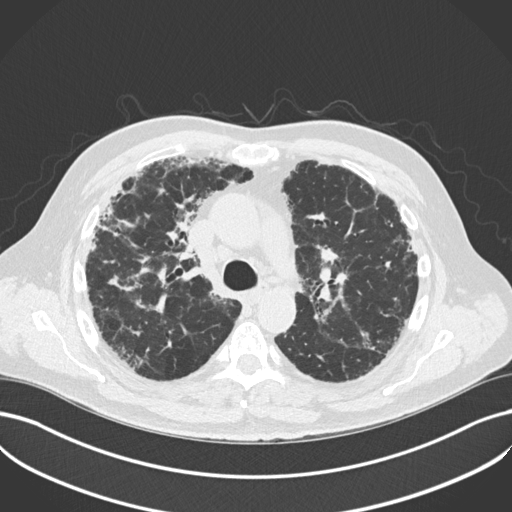
[im 109/160  mediastinal]
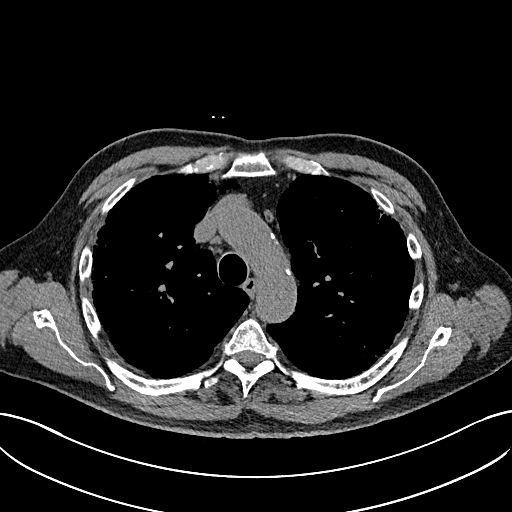
[im 109/160  lung]
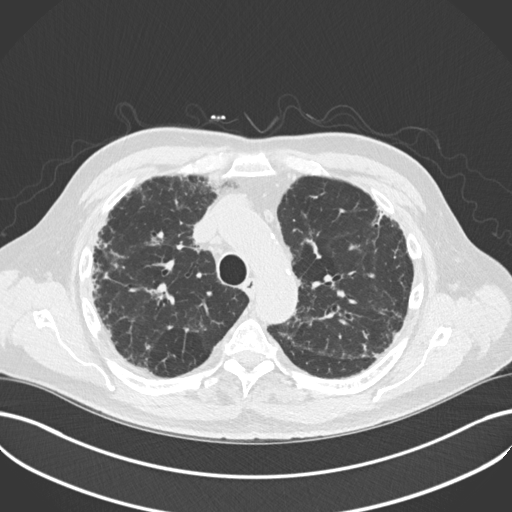
[im 126/160  lung]
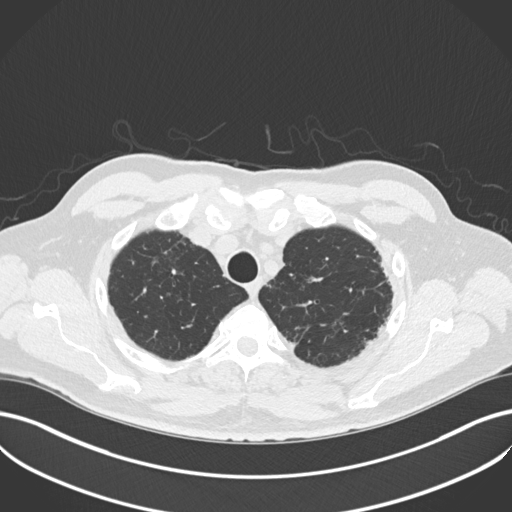
[im 134/160  lung]
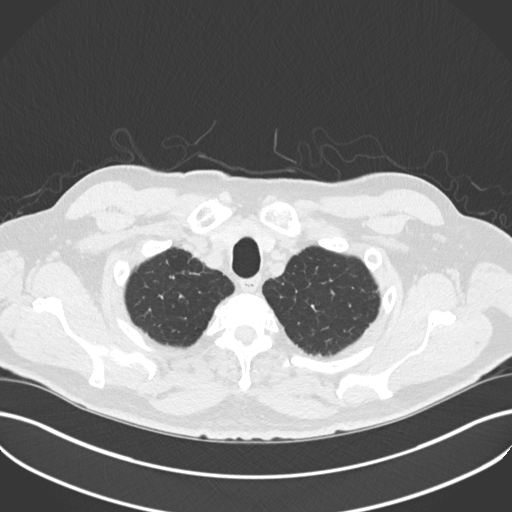
[im 151/160  lung]
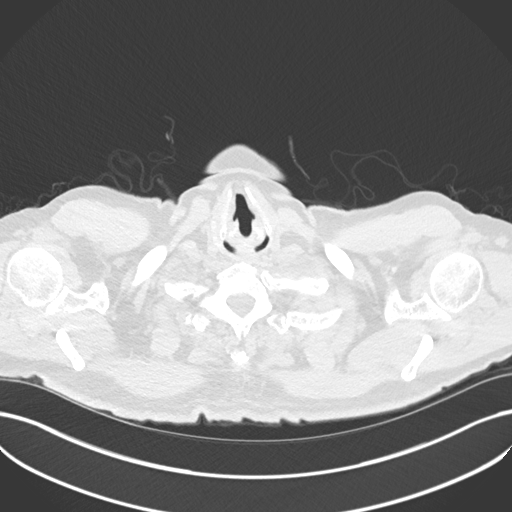

[Series 8: coronal · coronal · 0.67mm/px · 3 of 130 slices shown]
[im 26/130  lung]
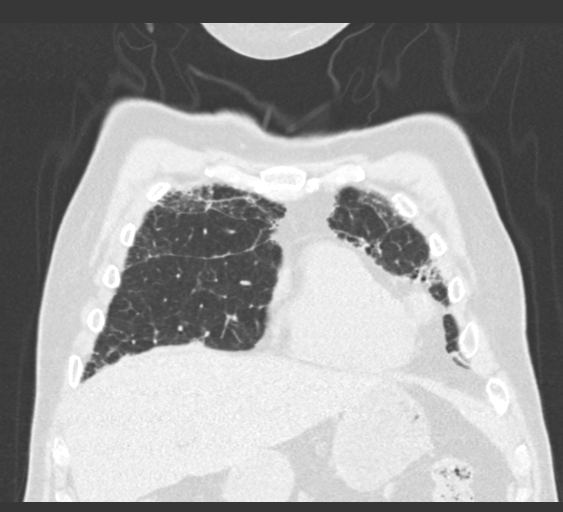
[im 52/130  lung]
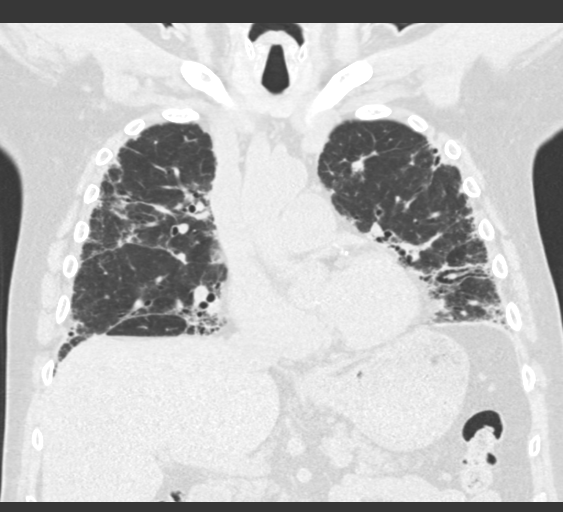
[im 78/130  lung]
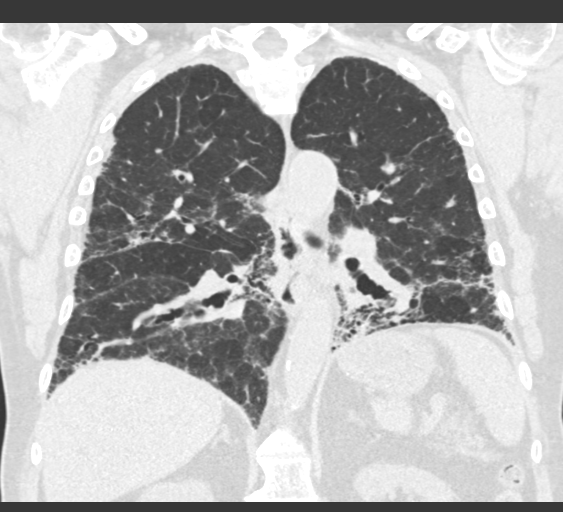

[15 of 36 positions shown; findings below may reference images not displayed]

FINDINGS: Cardiovascular: Normal heart size. No significant pericardial
fluid/thickening. Left anterior descending and left circumflex
coronary atherosclerosis. Atherosclerotic nonaneurysmal thoracic
aorta. Borderline prominent main pulmonary artery (3.3 cm diameter).

Mediastinum/Nodes: No discrete thyroid nodules. Unremarkable
esophagus. No axillary adenopathy. Mild right paratracheal
adenopathy measuring up to 1.1 cm (series 2/image 63). Mildly
enlarged 1.2 cm subcarinal node (series 2/image 77). No discrete
hilar adenopathy on these noncontrast images.

Lungs/Pleura: No pneumothorax. No pleural effusion. No acute
consolidative airspace disease, lung masses or significant pulmonary
nodules. Mild patchy air trapping in the upper lungs on the
expiration sequence. There is patchy confluent subpleural and
peribronchovascular reticulation and ground-glass attenuation in
both lungs with associated mild-to-moderate traction bronchiectasis
and architectural distortion. There is a basilar gradient to these
findings. There is possible early honeycombing in the left lower
lobe (series 3/image 104 and series 8/image 81).

Upper abdomen: Cholelithiasis.

Musculoskeletal: No aggressive appearing focal osseous lesions. Mild
thoracic spondylosis.
IMPRESSION: 1. Spectrum of findings compatible with basilar predominant fibrotic
interstitial lung disease with possible early honeycombing. Findings
represent probable usual interstitial pneumonia (UIP). Consider
follow-up high-resolution chest CT in 6-12 months to assess temporal
pattern stability, as clinically warranted.
2. Mild patchy air trapping in the upper lungs, indicative of small
airways disease.
3. Nonspecific mild mediastinal lymphadenopathy, probably reactive.
4. Two-vessel coronary atherosclerosis.
5. Borderline dilated main pulmonary artery.
6. Cholelithiasis.

Aortic Atherosclerosis ([id number]-[id number]).

## 2020-01-17 NOTE — Telephone Encounter (Signed)
Pt contacted and informed of approval. Nathan Howard has spoke to pt and informed that there is a $100 monthly copay. Pt wants to consult with Dr. Melvyn Novas and will let us know.

## 2020-01-24 ENCOUNTER — Ambulatory Visit: Payer: Medicare Other | Admitting: Internal Medicine

## 2020-01-24 ENCOUNTER — Encounter: Payer: Self-pay | Admitting: Internal Medicine

## 2020-01-24 ENCOUNTER — Other Ambulatory Visit: Payer: Self-pay

## 2020-01-24 DIAGNOSIS — R05 Cough: Secondary | ICD-10-CM | POA: Diagnosis not present

## 2020-01-24 DIAGNOSIS — J8410 Pulmonary fibrosis, unspecified: Secondary | ICD-10-CM | POA: Diagnosis not present

## 2020-01-24 DIAGNOSIS — R059 Cough, unspecified: Secondary | ICD-10-CM

## 2020-01-24 DIAGNOSIS — J471 Bronchiectasis with (acute) exacerbation: Secondary | ICD-10-CM

## 2020-01-24 MED ORDER — GABAPENTIN 100 MG PO CAPS: 100.0000 mg | ORAL_CAPSULE | Freq: Three times a day (TID) | ORAL | 2 refills | Status: DC

## 2020-01-24 MED ORDER — PANTOPRAZOLE SODIUM 40 MG PO TBEC: 40.0000 mg | DELAYED_RELEASE_TABLET | Freq: Every day | ORAL | 2 refills | Status: DC

## 2020-01-24 MED ORDER — AMOXICILLIN-POT CLAVULANATE 875-125 MG PO TABS: 1.0000 | ORAL_TABLET | Freq: Two times a day (BID) | ORAL | 0 refills | Status: AC

## 2020-01-24 MED ORDER — PREDNISONE 10 MG PO TABS: ORAL_TABLET | ORAL | 0 refills | Status: DC

## 2020-01-24 NOTE — Patient Instructions (Addendum)
Change prilosec to Pantoprazole (protonix) 40 mg   Take  30-60 min before first meal of the day and Pepcid (famotidine)  20 mg one after supper until return to office - this is the best way to tell whether stomach acid is contributing to your problem.    GERD (REFLUX)  is an extremely common cause of respiratory symptoms just like yours , many times with no obvious heartburn at all.    It can be treated with medication, but also with lifestyle changes including elevation of the head of your bed (ideally with 6 -8inch blocks under the headboard of your bed),  Smoking cessation, avoidance of late meals, excessive alcohol, and avoid fatty foods, chocolate, peppermint, colas, red wine, and acidic juices such as orange juice.  NO MINT OR MENTHOL PRODUCTS SO NO COUGH DROPS  USE SUGARLESS CANDY INSTEAD (Jolley ranchers or Stover's or Life Savers) or even ice chips will also do - the key is to swallow to prevent all throat clearing. NO OIL BASED VITAMINS - use powdered substitutes.  Avoid fish oil when coughing.  Augmentin 875 mg take one pill twice daily  X 10 days - take at breakfast and supper with large glass of water.  It would help reduce the usual side effects (diarrhea and yeast infections) if you ate cultured yogurt at lunch.   Prednisone 10 mg take  4 each am x 2 days,   2 each am x 2 days,  1 each am x 2 days and stop   Add gabapentin 100 mg three times daily    For cough use robissin DM as needed   Please schedule a follow up office visit in 4 weeks, call sooner if needed with all medications /inhalers/ solutions in hand so we can verify exactly what you are taking. This includes all medications from all doctors and over the [place] separate them into two bags:  the ones you take automatically, no matter what, vs the ones you take just when you feel you need them "BAG #2 is UP TO YOU"  - this will really help [place] help you take your medications more effectively.

## 2020-01-24 NOTE — Progress Notes (Signed)
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 Pulmonary Consult    Referred by Dr. Unice Cobble. Pt states having SOB since Dec 2015 after developing URI. He gets SOB with doing yard work and with singing. He states that he also has some PND and occ cough with white to clear sputum.   abruptly ill week before xmas, sense of acute sinus congestion, sore throat chest burning during severe coughing fits assoc with infected tooth rx with clindamycin x 40 days and tooth better, all symptoms improved except now doe x flight of steps or yardwork or fast walking. rec Change prilosec to omeprazole 40 mg Take 30-60 min before first meal of the day and Pepcid ac 20 at bedtime until return GERD diet   12/27/2015  f/u ov/Taraann Olthoff re: PF/ no change ex tol Chief Complaint  Patient presents with  . Follow-up    CXR done today. Breathing is unchanged. No new co's today.   rec No change rx = ppi qam/ h2hs    06/25/2016  f/u ov/Chaslyn Eisen re: PF/ maint on prilosec but worried about press so stopped pepcid Chief Complaint  Patient presents with  . Follow-up    PFT done today. Breathing is overall doing well.    able to work in the yard ok, some trouble bending over at waist, no limiting doe rec Continue prilosec otc '20mg'$   Take 30-60 min before first meal of the day and Pepcid ac (famotidine) 20 mg one @  bedtime        05/12/2018  f/u ov/Kassadi Presswood re:  PF with high ESR  Chief Complaint  Patient presents with  . Follow-up    follow up PFT. breathing unchanged.  Dyspnea:   Yard work / house work all fine Cough: worse than usual since winter 2019 day > noct and worse with ex/ deep breath  Sleep: ok one pillow  rec Prednisone 10 mg  2 daily until better then 1 daily x one week and off> never took it We will apply for OFEV initiation  > pt decided against after reviewing info   06/28/2018  f/u ov/Ayen Viviano re: PF  Chief Complaint  Patient presents with  . Follow-up    Breathing has improved some since the last visit. He has not started on prednisone yet.     Dyspnea:  Worse bending over picking up debris on ground Cough: rarely /still using cough drops   SABA use: none 02: none  rec Continue Try prilosec otc '20mg'$   Take 30-60 min before first meal of the day and Pepcid ac (famotidine) 20 mg one @  bedtime until return  GERD  Please remember to go to the lab department downstairs in the basement  for your tests - we will call you with the results when they are available. Please schedule a follow up visit in 2 months but call sooner if needed with pfts on return    - add: esr now double baseline to 66 so  rec pred 20 mg ceiling and floor of 0   09/01/2018  f/u ov/Uliana Brinker re:  ? IPF  Chief Complaint  Patient presents with  . Follow-up    PFT results, feels like he has rattle in his chest, SHOB  Dyspnea:  Yard work Cough: w/in a week of taking pred 20 mg / day cough resolved and taper off by around July 22 2018 s flare  Sleeping: flat bed/ one pillow SABA use: none 02:  None   rec If breathing/ cough worsen restart prednisone 20 mg daily and when better reduce to 10 mg daily as your new "floor" but be sure to monitor your sugars if you need to restart prednisone  Please schedule a follow up office visit in 6 weeks, call sooner if needed with 6 min walk    11/29/2018  f/u ov/Tynia Wiers re:  Chief Complaint  Patient presents with  . Follow-up    Cough has improved some. Breathing has also improved.    Dyspnea:  No change ex tol, admits not checking sats as rec Cough:  none Sleeping: fine SABA use: none 02:  None   rec Measure your oxygen saturation with the same activity as usual once a week - call if downward trend any lower than 88%  Please schedule a follow up visit in 4  months but call sooner if needed with pfts on return     06/22/2019  f/u ov/Merryn Thaker re:  PF on no rx other than gerd   Dyspnea:  Minimum worse doe but not checking sats  Cough: dry / usually worse in spring  Sleeping: ok at hs on one big pillow SABA use: none  02: none  rec No change medications Monitor your 02 saturations while on routine walk and call if trending down    10/24/2019  f/u ov/Conor Filsaime re: PF/ cough  Dyspnea: walking up from street 10 degrees sev hundred feet  does not check 02 Cough:  Worse when sob / never productive  Sleeping: ok flat, big pillow SABA use: inhalers don't help cough or sob  rec For cough > delsym 2 tsp every 12 hours as needed  Or robitussin but not both  For drainage / throat tickle try either zyrtec one at bedtime or  CHLORPHENIRAMINE  4 mg  (Chlortab '4mg'$  at [employer] should be easiest to find in the green box)  take one every 4 hours as needed - available over the counter- may cause drowsiness so start with just a bedtime dose or two and see how you tolerate it before trying in daytime  GERD  Diet  Please schedule a follow up visit in 3 months but call sooner if needed  - consider gabapentin 100 tid if not responding    01/24/2020  f/u ov/Gwendolyne Welford re: pf/ cough likely form bronchiectatic component  Chief Complaint  Patient presents with  . Follow-up    Increased cough with dark green sputum over the past month. He also reports increased SOB.    Dyspnea:  Not limited by breathing from desired activities  Unless coughing  Cough: severe x one month with purulent sputum esp in am  Sleeping: wedge pillow  SABA use: none  02: none  Assoc nasal congesiton but min discolored mucus    No obvious day to day or daytime variability or assoc    mucus plugs or hemoptysis or cp or chest tightness, subjective wheeze or overt  hb symptoms on otc ppi    Also denies any  obvious fluctuation of symptoms with weather or environmental changes or other aggravating or alleviating factors except as outlined above   No unusual exposure hx or h/o childhood pna/ asthma or knowledge of premature birth.  Current Allergies, Complete Past Medical History, Past Surgical History, Family History, and Social History were reviewed in [facility] record.  ROS  The following are not active complaints unless bolded Hoarseness, sore throat, dysphagia, dental problems, itching, sneezing,  nasal congestion or discharge of excess mucus or purulent secretions, ear ache,   fever, chills, sweats, unintended wt loss or wt gain, classically pleuritic or exertional cp,  orthopnea pnd or arm/hand swelling  or leg swelling, presyncope, palpitations, abdominal pain, anorexia, nausea, vomiting, diarrhea  or change in bowel habits or change in bladder habits, change in stools or change in urine, dysuria, hematuria,  rash, arthralgias, visual complaints, headache, numbness, weakness or ataxia or problems with walking or coordination,  change in mood or  memory.        Current Meds  Medication Sig  . acetaminophen (TYLENOL) 500 MG tablet Take 500 mg by mouth every 6 (six) hours as needed.  Marland Kitchen aspirin 81 MG tablet Take 1 tablet (81 mg total) by mouth daily.  Marland Kitchen atenolol (TENORMIN) 25 MG tablet TAKE 1/2 TABLET BY MOUTH EVERY MORNING.  . cetirizine (ZYRTEC) 10 MG tablet Take 10 mg by mouth daily as needed for allergies.  . Chlorpheniramine-Acetaminophen (CORICIDIN HBP COLD/FLU PO) Take by mouth.  . cyanocobalamin 2000 MCG tablet Take 1 tablet (2,000 mcg total) by mouth daily.  . famotidine (PEPCID) 20 MG tablet One at bedtime  . fenofibrate (TRICOR) 145 MG tablet TAKE ONE TABLET BY MOUTH ONCE DAILY.  Marland Kitchen guaifenesin (ROBITUSSIN) 100 MG/5ML syrup Take 200 mg by  mouth 3 (three) times daily as needed for cough.  Marland Kitchen JANUMET XR [phone] MG TB24 TAKE 1 TABLET BY MOUTH ONCE A DAY.  Marland Kitchen LIVALO 2 MG TABS TAKE 1 TABLET BY MOUTH ONCE A DAY.  Marland Kitchen losartan (COZAAR) 100 MG tablet TAKE (1) TABLET BY MOUTH ONCE DAILY.  . Multiple Vitamins-Minerals (CENTRUM SILVER PO) Take by mouth.  . Multiple Vitamins-Minerals (VITEYES AREDS ADVANCED PO) Take by mouth.  . Multiple Vitamins-Minerals (ZINC) LOZG Take by mouth daily.  Marland Kitchen omeprazole (PRILOSEC) 20 MG capsule TAKE ONE CAPSULE BY MOUTH DAILY.  Marland Kitchen VITAMIN D PO Take 1 tablet by mouth daily.                  Objective:   Physical Exam    01/24/2020       176  10/24/2019     184  06/22/2019       185  05/16/2015     209 > 06/28/2015    213  > 12/27/2015   211 > 06/25/2016  206 > 12/26/2016  203> 06/26/2017  211 >   03/29/2018 199 >  04/26/2018  201 > 05/12/2018    201 > 06/28/2018   201 > 09/01/2018    195 > 11/29/2018  212     02/26/15 213 lb (96.616 kg)  02/12/15 213 lb 12 oz (96.956 kg)    amb wm harsh coughing fits   Vital signs reviewed  01/24/2020  - Note at rest 02 sats  95% on RA      HEENT : pt wearing mask not removed for exam due to covid -19 concerns.    NECK :  without JVD/Nodes/TM/ nl carotid upstrokes bilaterally   LUNGS: no acc  muscle use,  Nl contour chest with coarse insp crackles half up  bilaterally with  cough on insp  maneuvers   CV:  RRR  no s3 or murmur or increase in P2, and no edema   ABD:  soft and nontender with nl inspiratory excursion in the supine position. No bruits or organomegaly appreciated, bowel sounds nl  MS:  Nl gait/ ext warm without deformities, calf tenderness, cyanosis or clubbing No obvious joint restrictions   SKIN: warm and dry without lesions    NEURO:  alert, approp, nl sensorium with  no motor or cerebellar deficits apparent.                  Assessment & Plan:

## 2020-01-25 ENCOUNTER — Encounter: Payer: Self-pay | Admitting: Internal Medicine

## 2020-01-25 DIAGNOSIS — J471 Bronchiectasis with (acute) exacerbation: Secondary | ICD-10-CM | POA: Insufficient documentation

## 2020-01-25 NOTE — Assessment & Plan Note (Addendum)
Gabapentin 100 mg tid added 01/25/2020  Plus max ppi   Cough on isp maneuvers is typical of PF, not airways dz so best rx to avoid narcotics is trial of gabapentin plus max ppi.   Use of PPI is associated with improved survival time and with decreased radiologic fibrosis per King's study published in AJRCCM vol 184 p1390.  Dec 2011 and also may have other beneficial effects as per the latest review in [place] vol 193 [id number] Jun 20016.  Discussed in detail all the  indications, usual  risks and alternatives  relative to the benefits with patient who agrees to proceed with Rx as outlined.           Each maintenance medication was reviewed in detail including emphasizing most importantly the difference between maintenance and prns and under what circumstances the prns are to be triggered using an action plan format where appropriate.  Total time for H and P, chart review, counseling,  and generating customized AVS unique to this office visit / charting = 30 min        This may not always be cause and effect, but given how universally underwhelming  (at least in terms of short term benefit)   and expensive  all the other  Drugs developed to day  have been for pf,   rec start  rx ppi / diet/ lifestyle modification and f/u with serial walking sats and lung volumes for now to put more points on the curve / establish firm baseline before considering additional measures.

## 2020-01-25 NOTE — Assessment & Plan Note (Signed)
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 -Smoking history encompassed less than 5 years. He's had no significant occupational exposures - 02/26/2015  Walked RA x 3 laps @ 185 ft each stopped due to  End of study , nl pace, no desats - 02/26/2015 collagen vasc screen sent > neg with esr 28  - 02/26/2015 rec max gerd rx/ diet and > improved 05/16/15  - 06/28/2015  VC 3.65 (85%) no obst and dlco 56 corrects  to 81  - 06/28/2015  Walked RA x 3 laps @ 185 ft each stopped due to End of study, nl pace, no sob or desat  - PFT's  06/25/2016   VC  3.85  (91%)  DLCO  53/51 % corrects to 72 % for alv volume  - 12/26/2016  Walked RA x 3 laps @ 185 ft each stopped due to  End of study, nl pace, no sob /sats 91% at end   - PFTs  06/26/2017   VC  3.33 (79%)   DLCO  62/59  %  Corrects to 93%    - 12/28/2017  6 m walk  =  336 sats ok   - HRCT chest 03/25/18 :  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 - 04/26/2018  Walked RA x 3 laps @ 185 ft each stopped due to  End of study, nl pace, sat 92% at end, min sob  - PFT's  05/12/2018  VC 3.08 (74%)  w obst DLCO  43 % corrects to 77  % for alv volume   - ESR 49  04/26/17 so 05/12/2018  rec pred 20 mg daily until cough better then 10 mg x one week and stop > never took it  - 05/12/2018 initiate ofev paperwork > declined  - 06/28/2018  Walked RA x 3 laps @ 185 ft each stopped due to  End of study, nl pace, no sob but  desat  To as low as 88%   - ESR 06/28/2018 up to 66 rec pred 20 mg per day until breathing better then taper off> cough resolved   - PFT's  09/01/2018  FVC  2.93(70%)   p no % improvement from saba p no prior to study  / not able to do dlco    - ESR 51 off prednisone x 2 weeks  - 09/01/2018  Walked RA x 3 laps @ 185 ft each stopped due to  End of study, slow pace, no sob and sats 89% at end   - 11/29/18:  6mw  =  408 meters, lowest sat was  88%  - 06/22/2019   Walked RA  2 laps @  approx 250ft each @ avg pace  stopped due to  End of study, sats 88%, min sob  - 10/24/2019   Walked RA  2 laps @  approx 250ft each @ avg pace  stopped due to  End of study, sats 87% no sob    Not checking sats with activity as rec, bothered more by cough than sob with purulent sputum suggesting bronchiectasis flare - see sep a/p.  Declined antifibrotic rx so will focus on symptom control.  Pt informed of the seriousness of COVID 19 infection as a direct risk to lung health  and safey and to close contacts and should continue to wear a facemask in public and minimize exposure to public locations but especially avoid any area or   activity where non-close contacts are not observing distancing or wearing an appropriate face mask.  I strongly recommended vaccine when offered.

## 2020-01-25 NOTE — Assessment & Plan Note (Signed)
See HRCT chest  03/25/18: patchy confluent subpleural and peribronchovascular reticulation and ground-glass attenuation in both lungs with associated mild-to-moderate traction bronchiectasis and architectural distortion  rec augmentin as assoc with nasal symptoms and pred x 6 days   May need to add flutter or vest next if not improving

## 2020-02-17 ENCOUNTER — Other Ambulatory Visit: Payer: Self-pay | Admitting: Internal Medicine

## 2020-02-17 DIAGNOSIS — E785 Hyperlipidemia, unspecified: Secondary | ICD-10-CM

## 2020-02-21 ENCOUNTER — Ambulatory Visit: Payer: Medicare PPO | Admitting: Internal Medicine

## 2020-02-21 ENCOUNTER — Other Ambulatory Visit: Payer: Self-pay

## 2020-02-21 ENCOUNTER — Encounter: Payer: Self-pay | Admitting: Internal Medicine

## 2020-02-21 DIAGNOSIS — R053 Chronic cough: Secondary | ICD-10-CM

## 2020-02-21 DIAGNOSIS — R05 Cough: Secondary | ICD-10-CM | POA: Diagnosis not present

## 2020-02-21 DIAGNOSIS — J8410 Pulmonary fibrosis, unspecified: Secondary | ICD-10-CM

## 2020-02-21 MED ORDER — AMOXICILLIN-POT CLAVULANATE 875-125 MG PO TABS: 1.0000 | ORAL_TABLET | Freq: Two times a day (BID) | ORAL | 0 refills | Status: AC

## 2020-02-21 MED ORDER — PREDNISONE 10 MG PO TABS: ORAL_TABLET | ORAL | 0 refills | Status: DC

## 2020-02-21 NOTE — Patient Instructions (Addendum)
Make sure you check your oxygen saturations at highest level of activity to be sure it stays over 90%    Augmentin 875 mg take one pill twice daily  X 10 days - take at breakfast and supper with large glass of water.  It would help reduce the usual side effects (diarrhea and yeast infections) if you ate cultured yogurt at lunch.   Take Prednisone 4 for three days 3 for three days 2 for three days 1 for three days and stop    For cough use robissin DM as needed  Or Delsym but not both   Please schedule a follow up office visit in 4 weeks, call sooner if needed with all medications /inhalers/ solutions in hand so we can verify exactly what you are taking. This includes all medications from all doctors and over the [place] separate them into two bags:  the ones you take automatically, no matter what, vs the ones you take just when you feel you need them "BAG #2 is UP TO YOU"  - this will really help [place] help you take your medications more effectively.

## 2020-02-21 NOTE — Progress Notes (Signed)
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 Pulmonary Consult    Referred by Dr. Unice Cobble. Pt states having SOB since Dec 2015 after developing URI. He gets SOB with doing yard work and with singing. He states that he also has some PND and occ cough with white to clear sputum.   abruptly ill week before xmas, sense of acute sinus congestion, sore throat chest burning during severe coughing fits assoc with infected tooth rx with clindamycin x 40 days and tooth better, all symptoms improved except now doe x flight of steps or yardwork or fast walking. rec Change prilosec to omeprazole 40 mg Take 30-60 min before first meal of the day and Pepcid ac 20 at bedtime until return GERD diet   12/27/2015  f/u ov/Jenisha Faison re: PF/ no change ex tol Chief Complaint  Patient presents with  . Follow-up    CXR done today. Breathing is unchanged. No new co's today.   rec No change rx = ppi qam/ h2hs    06/25/2016  f/u ov/Eusevio Schriver re: PF/ maint on prilosec but worried about press so stopped pepcid Chief Complaint  Patient presents with  . Follow-up    PFT done today. Breathing is overall doing well.    able to work in the yard ok, some trouble bending over at waist, no limiting doe rec Continue prilosec otc 5m  Take 30-60 min before first meal of the day and Pepcid ac (famotidine) 20 mg one @  bedtime        05/12/2018  f/u ov/Shenika Quint re:  PF with high ESR  Chief Complaint  Patient presents with  . Follow-up    follow up PFT. breathing unchanged.  Dyspnea:   Yard work / house work all fine Cough: worse than usual since winter 2019 day > noct and worse with ex/ deep breath  Sleep: ok one pillow  rec Prednisone 10 mg  2 daily until better then 1 daily x one week and off> never took it We will apply for OFEV initiation  > pt decided against after reviewing info   06/28/2018  f/u ov/Kimbree Casanas re: PF  Chief Complaint  Patient presents with  . Follow-up    Breathing has improved some since the last visit. He has not started on prednisone yet.     Dyspnea:  Worse bending over picking up debris on ground Cough: rarely /still using cough drops   SABA use: none 02: none  rec Continue Try prilosec otc 227m Take 30-60 min before first meal of the day and Pepcid ac (famotidine) 20 mg one @  bedtime until return  GERD  Please remember to go to the lab department downstairs in the basement  for your tests - we will call you with the results when they are available. Please schedule a follow up visit in 2 months but call sooner if needed with pfts on return    - add: esr now double baseline to 66 so  rec pred 20 mg ceiling and floor of 0   09/01/2018  f/u ov/Dravin Lance re:  ? IPF  Chief Complaint  Patient presents with  . Follow-up    PFT results, feels like he has rattle in his chest, SHOB  Dyspnea:  Yard work Cough: w/in a week of taking pred 20 mg / day cough resolved and taper off by around July 22 2018 s flare  Sleeping: flat bed/ one pillow SABA use: none 02:  None   rec If breathing/ cough worsen restart prednisone 20 mg daily and when better reduce to 10 mg daily as your new "floor" but be sure to monitor your sugars if you need to restart prednisone  Please schedule a follow up office visit in 6 weeks, call sooner if needed with 6 min walk    11/29/2018  f/u ov/Leeum Sankey re:  Chief Complaint  Patient presents with  . Follow-up    Cough has improved some. Breathing has also improved.    Dyspnea:  No change ex tol, admits not checking sats as rec Cough:  none Sleeping: fine SABA use: none 02:  None   rec Measure your oxygen saturation with the same activity as usual once a week - call if downward trend any lower than 88%  Please schedule a follow up visit in 4  months but call sooner if needed with pfts on return     06/22/2019  f/u ov/Leshawn Houseworth re:  PF on no rx other than gerd   Dyspnea:  Minimum worse doe but not checking sats  Cough: dry / usually worse in spring  Sleeping: ok at hs on one big pillow SABA use: none  02: none  rec No change medications Monitor your 02 saturations while on routine walk and call if trending down    10/24/2019  f/u ov/Mar Walmer re: PF/ cough  Dyspnea: walking up from street 10 degrees sev hundred feet  does not check 02 Cough:  Worse when sob / never productive  Sleeping: ok flat, big pillow SABA use: inhalers don't help cough or sob  rec For cough > delsym 2 tsp every 12 hours as needed  Or robitussin but not both  For drainage / throat tickle try either zyrtec one at bedtime or  CHLORPHENIRAMINE  4 mg  (Chlortab 27m at [facility] be easiest to find in the green box)  take one every 4 hours as needed - available over the counter- may cause drowsiness so start with just a bedtime dose or two and see how you tolerate it before trying in daytime  GERD  Diet  Please schedule a follow up visit in 3 months but call sooner if needed  - consider gabapentin 100 tid if not responding    01/24/2020  f/u ov/Jamile Rekowski re: pf/ cough likely form bronchiectatic component  Chief Complaint  Patient presents with  . Follow-up    Increased cough with dark green sputum over the past month. He also reports increased SOB.   Dyspnea:  Not limited by breathing from desired activities  Unless coughing  Cough: severe x one month with purulent sputum esp in am  Sleeping: wedge pillow  SABA use: none  02: none  Assoc nasal congesiton but min discolored mucus rec Change prilosec to Pantoprazole (protonix) 40 mg   Take   30-60 min before first meal of the day and Pepcid (famotidine)  20 mg one after supper until return to office - this is the best way to tell  whether stomach acid is contributing to your problem.   GERD (REFLUX) Augmentin 875 mg take one pill twice daily  X 10 days Prednisone 10 mg take  4 each am x 2 days,   2 each am x 2 days,  1 each am x 2 days and  add gabapentin 100 mg three times daily   For cough use robissin DM as needed  Please schedule a follow up office visit in 4 weeks, call sooner if needed with all medications /inhalers/ solutions in hand so we can verify exactly what you are taking. This includes all medications from all doctors and over the [place] separate them into two bags:  the ones you take automatically, no matter what, vs the ones you take just when you feel you need them "BAG #2 is UP TO YOU"  - this will really help [place] help you take your medications more effectively.     02/21/2020  f/u ov/Yatziri Wainwright re: pf /bronchiectasis / improved p augmentin/pred / brought meds separated into Dr Ronnald Ramp vs Dr Melvyn Novas vs otc bags  Chief Complaint  Patient presents with  . Follow-up    4wk f/u. Still has a semi-productive cough at times. Has not noticed a difference in SOB.   Dyspnea:  Mailbox and back ok / not checking  sats  Cough: some better - decided against taking gabapentin after read about side effects Sleeping: fine flat SABA use: none  02: none    No obvious day to day or daytime variability or assoc excess/ purulent sputum or mucus plugs or hemoptysis or cp or chest tightness, subjective wheeze or overt sinus or hb symptoms.   Sleeping  without nocturnal  or early am exacerbation  of respiratory  c/o's or need for noct saba. Also denies any obvious fluctuation of symptoms with weather or environmental changes or other aggravating or alleviating factors except as outlined above   No unusual exposure hx or h/o childhood pna/ asthma or knowledge of premature birth.  Current  Allergies, Complete Past Medical History, Past Surgical History, Family History, and Social History were reviewed in [employer] record.  ROS  The following are not active complaints unless bolded Hoarseness, sore throat, dysphagia, dental problems, itching, sneezing,  nasal congestion or discharge of excess mucus or purulent secretions, ear ache,   fever, chills, sweats, unintended wt loss or wt gain, classically pleuritic or exertional cp,  orthopnea pnd or arm/hand swelling  or leg swelling, presyncope, palpitations, abdominal pain, anorexia, nausea, vomiting, diarrhea  or change in bowel habits or change in bladder habits, change in stools or change in urine, dysuria, hematuria,  rash, arthralgias, visual complaints, headache, numbness, weakness or ataxia or problems with walking or coordination,  change in mood or  memory.        No outpatient medications have been marked as taking for the 02/21/20 encounter (Office Visit) with Tanda Rockers, MD.                        Objective:   Physical Exam   02/21/2020      176  01/24/2020       176  10/24/2019     184  06/22/2019       185  05/16/2015     209 > 06/28/2015    213  > 12/27/2015   211 > 06/25/2016  206 > 12/26/2016  203> 06/26/2017  211 >   03/29/2018 199 >  04/26/2018  201 > 05/12/2018    201 > 06/28/2018   201 > 09/01/2018    195 > 11/29/2018  212     02/26/15 213 lb (96.616 kg)  02/12/15 213 lb 12 oz (96.956 kg)      Vital signs reviewed  02/21/2020  - Note at rest 02 sats  98% on RA       HEENT : pt wearing mask not removed for exam due to covid -19 concerns.    NECK :  without JVD/Nodes/TM/ nl carotid upstrokes bilaterally   LUNGS: no acc muscle use,  Nl contour chest which coarse insp crackles half way up  bilaterally without cough on insp or exp maneuvers   CV:  RRR  no s3 or murmur or increase in P2, and no edema   ABD:  soft and nontender with nl inspiratory excursion in the supine position. No bruits or  organomegaly appreciated, bowel sounds nl  MS:  Nl gait/ ext warm without deformities, calf tenderness, cyanosis or clubbing No obvious joint restrictions   SKIN: warm and dry without lesions    NEURO:  alert, approp, nl sensorium with  no motor or cerebellar deficits apparent.                Assessment & Plan:

## 2020-02-22 ENCOUNTER — Encounter: Payer: Self-pay | Admitting: Internal Medicine

## 2020-02-22 NOTE — Assessment & Plan Note (Addendum)
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 -Smoking history encompassed less than 5 years. He's had no significant occupational exposures - 02/26/2015  Walked RA x 3 laps @ 185 ft each stopped due to  End of study , nl pace, no desats - 02/26/2015 collagen vasc screen sent > neg with esr 28  - 02/26/2015 rec max gerd rx/ diet and > improved 05/16/15  - 06/28/2015  VC 3.65 (85%) no obst and dlco 56 corrects  to 81  - 06/28/2015  Walked RA x 3 laps @ 185 ft each stopped due to End of study, nl pace, no sob or desat  - PFT's  06/25/2016   VC  3.85  (91%)  DLCO  53/51 % corrects to 72 % for alv volume  - 12/26/2016  Walked RA x 3 laps @ 185 ft each stopped due to  End of study, nl pace, no sob /sats 91% at end   - PFTs  06/26/2017   VC  3.33 (79%)   DLCO  62/59  %  Corrects to 93%    - 12/28/2017  6 m walk  =  336 sats ok   - HRCT chest 03/25/18 :  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 - 04/26/2018  Walked RA x 3 laps @ 185 ft each stopped due to  End of study, nl pace, sat 92% at end, min sob  - PFT's  05/12/2018  VC 3.08 (74%)  w obst DLCO  43 % corrects to 77  % for alv volume   - ESR 49  04/26/17 so 05/12/2018  rec pred 20 mg daily until cough better then 10 mg x one week and stop > never took it  - 05/12/2018 initiate ofev paperwork > declined  - 06/28/2018  Walked RA x 3 laps @ 185 ft each stopped due to  End of study, nl pace, no sob but  desat  To as low as 88%   - ESR 06/28/2018 up to 66 rec pred 20 mg per day until breathing better then taper off> cough resolved   - PFT's  09/01/2018  FVC  2.93(70%)   p no % improvement from saba p no prior to study  / not able to do dlco    - ESR 51 off prednisone x 2 weeks  - 09/01/2018  Walked RA x 3 laps @ 185 ft each stopped due to  End of study, slow pace, no sob and sats 89% at end   - 11/29/18:  81m  =  408 meters, lowest sat was  88%  - 06/22/2019   Walked RA  2 laps @  approx 2543feach @ avg pace  stopped due to  End of study, sats 88%, min sob  - 10/24/2019   Walked RA  2 laps @  approx 25038fach @ avg pace  stopped due to  End of study, sats 87% no sob    No evidence of dz progression / advised to monitor sats  Still haven't been able to do accurate med reconciliation > will try again next ov

## 2020-02-22 NOTE — Assessment & Plan Note (Addendum)
Assoc with PF/ worse on insp and with ex   Says he does think augmentin and pred helped so repeat rx with this time twice the prednisone to see if get twice the response either in terms of degree of improvement or duration          Each maintenance medication was reviewed in detail including emphasizing most importantly the difference between maintenance and prns and under what circumstances the prns are to be triggered using an action plan format where appropriate.  Total time for H and P, chart review, counseling,   and generating customized AVS unique to this office visit / charting = 20 min

## 2020-03-01 ENCOUNTER — Other Ambulatory Visit: Payer: Self-pay | Admitting: Internal Medicine

## 2020-03-01 DIAGNOSIS — E1129 Type 2 diabetes mellitus with other diabetic kidney complication: Secondary | ICD-10-CM

## 2020-03-01 DIAGNOSIS — IMO0002 Reserved for concepts with insufficient information to code with codable children: Secondary | ICD-10-CM

## 2020-03-02 ENCOUNTER — Other Ambulatory Visit: Payer: Self-pay | Admitting: Internal Medicine

## 2020-03-20 ENCOUNTER — Other Ambulatory Visit: Payer: Self-pay | Admitting: Internal Medicine

## 2020-03-27 ENCOUNTER — Ambulatory Visit: Payer: Medicare PPO | Admitting: Internal Medicine

## 2020-04-03 ENCOUNTER — Ambulatory Visit: Payer: Medicare PPO | Admitting: Internal Medicine

## 2020-04-16 ENCOUNTER — Other Ambulatory Visit: Payer: Self-pay

## 2020-04-16 ENCOUNTER — Encounter: Payer: Self-pay | Admitting: Internal Medicine

## 2020-04-16 ENCOUNTER — Ambulatory Visit (INDEPENDENT_AMBULATORY_CARE_PROVIDER_SITE_OTHER): Payer: Medicare PPO

## 2020-04-16 ENCOUNTER — Ambulatory Visit (INDEPENDENT_AMBULATORY_CARE_PROVIDER_SITE_OTHER): Payer: Medicare PPO | Admitting: Internal Medicine

## 2020-04-16 VITALS — BP 112/72 | HR 88 | Temp 98.2°F | Resp 20 | Ht 70.0 in | Wt 166.0 lb

## 2020-04-16 DIAGNOSIS — E781 Pure hyperglyceridemia: Secondary | ICD-10-CM

## 2020-04-16 DIAGNOSIS — Z0000 Encounter for general adult medical examination without abnormal findings: Secondary | ICD-10-CM | POA: Diagnosis not present

## 2020-04-16 DIAGNOSIS — N1832 Chronic kidney disease, stage 3b: Secondary | ICD-10-CM

## 2020-04-16 DIAGNOSIS — I10 Essential (primary) hypertension: Secondary | ICD-10-CM

## 2020-04-16 DIAGNOSIS — E1129 Type 2 diabetes mellitus with other diabetic kidney complication: Secondary | ICD-10-CM | POA: Diagnosis not present

## 2020-04-16 DIAGNOSIS — R059 Cough, unspecified: Secondary | ICD-10-CM

## 2020-04-16 DIAGNOSIS — IMO0002 Reserved for concepts with insufficient information to code with codable children: Secondary | ICD-10-CM

## 2020-04-16 DIAGNOSIS — J8410 Pulmonary fibrosis, unspecified: Secondary | ICD-10-CM

## 2020-04-16 DIAGNOSIS — R0602 Shortness of breath: Secondary | ICD-10-CM | POA: Diagnosis not present

## 2020-04-16 DIAGNOSIS — E039 Hypothyroidism, unspecified: Secondary | ICD-10-CM

## 2020-04-16 DIAGNOSIS — D518 Other vitamin B12 deficiency anemias: Secondary | ICD-10-CM

## 2020-04-16 DIAGNOSIS — E038 Other specified hypothyroidism: Secondary | ICD-10-CM

## 2020-04-16 DIAGNOSIS — R05 Cough: Secondary | ICD-10-CM

## 2020-04-16 DIAGNOSIS — E1165 Type 2 diabetes mellitus with hyperglycemia: Secondary | ICD-10-CM

## 2020-04-16 LAB — CBC WITH DIFFERENTIAL/PLATELET
Basophils Absolute: 0 10*3/uL (ref 0.0–0.1)
Basophils Relative: 0.6 % (ref 0.0–3.0)
Eosinophils Absolute: 0.2 10*3/uL (ref 0.0–0.7)
Eosinophils Relative: 3 % (ref 0.0–5.0)
HCT: 43.3 % (ref 39.0–52.0)
Hemoglobin: 14.4 g/dL (ref 13.0–17.0)
Lymphocytes Relative: 22.5 % (ref 12.0–46.0)
Lymphs Abs: 1.8 10*3/uL (ref 0.7–4.0)
MCHC: 33.3 g/dL (ref 30.0–36.0)
MCV: 90.5 fl (ref 78.0–100.0)
Monocytes Absolute: 0.4 10*3/uL (ref 0.1–1.0)
Monocytes Relative: 5.4 % (ref 3.0–12.0)
Neutro Abs: 5.6 10*3/uL (ref 1.4–7.7)
Neutrophils Relative %: 68.5 % (ref 43.0–77.0)
Platelets: 311 10*3/uL (ref 150.0–400.0)
RBC: 4.79 Mil/uL (ref 4.22–5.81)
RDW: 14.3 % (ref 11.5–15.5)
WBC: 8.2 10*3/uL (ref 4.0–10.5)

## 2020-04-16 LAB — BASIC METABOLIC PANEL
BUN: 23 mg/dL (ref 6–23)
CO2: 30 mEq/L (ref 19–32)
Calcium: 10.2 mg/dL (ref 8.4–10.5)
Chloride: 99 mEq/L (ref 96–112)
Creatinine, Ser: 1.63 mg/dL — ABNORMAL HIGH (ref 0.40–1.50)
GFR: 40.79 mL/min — ABNORMAL LOW (ref >60.00)
Glucose, Bld: 107 mg/dL — ABNORMAL HIGH (ref 70–99)
Potassium: 4.1 mEq/L (ref 3.5–5.1)
Sodium: 135 mEq/L (ref 135–145)

## 2020-04-16 LAB — HEMOGLOBIN A1C: Hgb A1c MFr Bld: 6.4 % (ref 4.6–6.5)

## 2020-04-16 LAB — TSH: TSH: 6.17 u[IU]/mL — ABNORMAL HIGH (ref 0.35–4.50)

## 2020-04-16 NOTE — Patient Instructions (Signed)

## 2020-04-16 NOTE — Progress Notes (Signed)
Subjective:  Patient ID: Nathan Howard, male    DOB: 04/14/1941  Age: 79 y.o. MRN: [id number]  CC: Annual Exam, Cough, Hypertension, and Diabetes  This visit occurred during the SARS-CoV-2 public health emergency.  Safety protocols were in place, including screening questions prior to the visit, additional usage of staff PPE, and extensive cleaning of exam room while observing appropriate contact time as indicated for disinfecting solutions.    HPI Nathan Howard presents for a CPX.  Since I last saw him he complains of worsening cough.  The cough is intermittently productive of clear phlegm, yellow phlegm, and brown phlegm.  I would recommend that he use a nebulized LAMA that he has not been willing to do that thus far.  He also complains of worsening shortness of breath.  He recently saw his pulmonologist for pulmonary fibrosis and did a course of prednisone but he says it did not help much.  He also complains of weight loss, dizziness, and lightheadedness.  Outpatient Medications Prior to Visit  Medication Sig Dispense Refill  . acetaminophen (TYLENOL) 500 MG tablet Take 500 mg by mouth every 6 (six) hours as needed.    Marland Kitchen aspirin 81 MG tablet Take 1 tablet (81 mg total) by mouth daily. 90 tablet 1  . atenolol (TENORMIN) 25 MG tablet TAKE 1/2 TABLET BY MOUTH EVERY MORNING. 45 tablet 0  . cetirizine (ZYRTEC) 10 MG tablet Take 10 mg by mouth daily as needed for allergies.    . Chlorpheniramine-Acetaminophen (CORICIDIN HBP COLD/FLU PO) Take by mouth.    . cyanocobalamin 2000 MCG tablet Take 1 tablet (2,000 mcg total) by mouth daily. 90 tablet 1  . famotidine (PEPCID) 20 MG tablet One at bedtime    . fenofibrate (TRICOR) 145 MG tablet TAKE ONE TABLET BY MOUTH ONCE DAILY. 90 tablet 1  . Glycopyrrolate (LONHALA MAGNAIR STARTER KIT) 25 MCG/ML SOLN Inhale 1 Act into the lungs 2 (two) times daily. 60 mL 1  . guaifenesin (ROBITUSSIN) 100 MG/5ML syrup Take 200 mg by mouth 3 (three) times  daily as needed for cough.    Marland Kitchen LIVALO 2 MG TABS TAKE 1 TABLET BY MOUTH ONCE A DAY. 90 tablet 1  . Multiple Vitamins-Minerals (CENTRUM SILVER PO) Take by mouth.    . Multiple Vitamins-Minerals (VITEYES AREDS ADVANCED PO) Take by mouth.    . Multiple Vitamins-Minerals (ZINC) LOZG Take by mouth daily.    . pantoprazole (PROTONIX) 40 MG tablet Take 1 tablet (40 mg total) by mouth daily. Take 30-60 min before first meal of the day 30 tablet 2  . VITAMIN D PO Take 1 tablet by mouth daily.    Marland Kitchen JANUMET XR [phone] MG TB24 TAKE 1 TABLET BY MOUTH ONCE A DAY. 90 tablet 1  . losartan (COZAAR) 100 MG tablet TAKE (1) TABLET BY MOUTH ONCE DAILY. 90 tablet 0  . predniSONE (DELTASONE) 10 MG tablet Take 4 for three days 3 for three days 2 for three days 1 for three days and stop 30 tablet 0   No facility-administered medications prior to visit.    ROS Review of Systems  Constitutional: Positive for fatigue and unexpected weight change (wt loss). Negative for chills, diaphoresis and fever.  HENT: Positive for congestion, postnasal drip and rhinorrhea. Negative for sinus pressure, sinus pain, sore throat and trouble swallowing.   Eyes: Negative.   Respiratory: Positive for cough and shortness of breath. Negative for wheezing.   Cardiovascular: Negative for chest pain, palpitations and leg swelling.  Gastrointestinal: Negative for abdominal pain, constipation, diarrhea, nausea and vomiting.  Endocrine: Negative.   Genitourinary: Negative.  Negative for difficulty urinating.  Musculoskeletal: Negative for arthralgias and myalgias.  Skin: Negative.  Negative for color change, pallor and rash.  Neurological: Positive for dizziness and light-headedness. Negative for weakness and headaches.  Hematological: Negative for adenopathy. Does not bruise/bleed easily.  Psychiatric/Behavioral: Negative.     Objective:  BP 112/72 (BP Location: Left Arm, Patient Position: Sitting, Cuff Size: Normal)   Pulse 88   Temp  98.2 F (36.8 C) (Oral)   Resp 20   Ht '5\' 10"'$  (1.778 m)   Wt 166 lb (75.3 kg)   SpO2 90%   BMI 23.82 kg/m   BP Readings from Last 3 Encounters:  04/16/20 112/72  02/21/20 116/70  01/24/20 118/60    Wt Readings from Last 3 Encounters:  04/16/20 166 lb (75.3 kg)  02/21/20 176 lb 6.4 oz (80 kg)  01/24/20 176 lb (79.8 kg)    Physical Exam Vitals reviewed.  Constitutional:      General: He is not in acute distress.    Appearance: He is ill-appearing. He is not toxic-appearing or diaphoretic.  HENT:     Nose: Nose normal.     Mouth/Throat:     Mouth: Mucous membranes are moist.  Eyes:     General: No scleral icterus.    Conjunctiva/sclera: Conjunctivae normal.  Cardiovascular:     Rate and Rhythm: Normal rate and regular rhythm.     Heart sounds: No murmur.  Pulmonary:     Effort: Tachypnea and accessory muscle usage present. No respiratory distress.     Breath sounds: No stridor or decreased air movement. Examination of the right-middle field reveals rales. Examination of the left-middle field reveals rales. Examination of the right-lower field reveals rales. Examination of the left-lower field reveals rales. Rales present. No decreased breath sounds, wheezing or rhonchi.  Abdominal:     General: Abdomen is flat.     Palpations: There is no mass.     Tenderness: There is no abdominal tenderness. There is no guarding or rebound.  Musculoskeletal:        General: Normal range of motion.     Cervical back: Neck supple.     Right lower leg: No edema.     Left lower leg: No edema.  Lymphadenopathy:     Cervical: No cervical adenopathy.  Skin:    General: Skin is warm and dry.     Coloration: Skin is not pale.  Neurological:     General: No focal deficit present.     Mental Status: He is alert and oriented to person, place, and time. Mental status is at baseline.  Psychiatric:        Mood and Affect: Mood normal.        Behavior: Behavior normal.     Lab Results   Component Value Date   WBC 8.2 04/16/2020   HGB 14.4 04/16/2020   HCT 43.3 04/16/2020   PLT 311.0 04/16/2020   GLUCOSE 107 (H) 04/16/2020   CHOL 129 06/15/2019   TRIG 122.0 06/15/2019   HDL 35.80 (L) 06/15/2019   LDLDIRECT 108.0 11/17/2017   LDLCALC 68 06/15/2019   ALT 12 04/26/2018   AST 17 04/26/2018   NA 135 04/16/2020   K 4.1 04/16/2020   CL 99 04/16/2020   CREATININE 1.63 (H) 04/16/2020   BUN 23 04/16/2020   CO2 30 04/16/2020   TSH 6.17 (H) 04/16/2020  PSA 1.35 03/24/2011   HGBA1C 6.4 04/16/2020   MICROALBUR <0.7 01/02/2020    DG Chest 2 View  Result Date: 06/16/2019 CLINICAL DATA:  Dyspnea.  Pulmonary fibrosis.  Left chest wall pain. EXAM: CHEST - 2 VIEW COMPARISON:  01/21/2017 and chest CT 03/25/2018 FINDINGS: Diffuse interstitial thickening compatible with fibrosis. Fibrotic changes are similar to the prior examinations. Heart and mediastinum are stable and within normal limits. No large pleural effusions. No acute bone abnormality. IMPRESSION: Pulmonary fibrosis and minimal change from the prior examinations. No acute findings. Electronically Signed   By: Markus Daft M.D.   On: 06/16/2019 09:06   DG Chest 2 View  Result Date: 04/17/2020 CLINICAL DATA:  Shortness of breath. Pulmonary fibrosis. Unexplained weight loss. EXAM: CHEST - 2 VIEW COMPARISON:  01/02/2020.  06/15/2019.  CT 03/25/2018. FINDINGS: Mediastinum hilar structures normal. Stable chronic interstitial changes are again noted consistent with patient's known pulmonary fibrosis. No pleural effusion or pneumothorax. Heart size normal. Degenerative change thoracic spine. IMPRESSION: Stable chronic interstitial changes noted consistent with the patient's known pulmonary fibrosis. No acute abnormality identified. Electronically Signed   By: Marcello Moores  Register   On: 04/17/2020 06:03    Assessment & Plan:   Sevan was seen today for annual exam, cough, hypertension and diabetes.  Diagnoses and all orders for this  visit:  Essential hypertension- His blood pressure is over controlled and he is symptomatic.  I recommended that he stop taking losartan. -     CBC with Differential/Platelet; Future -     Basic metabolic panel; Future -     TSH; Future -     TSH -     Basic metabolic panel -     CBC with Differential/Platelet  Type 2 diabetes, uncontrolled, with renal manifestation ([place])- His A1c is at 6.4% and he has had a slight decline in his renal function.  I recommended that he stop taking the combination of a DPP 4 inhibitor and Metformin. -     Hemoglobin A1c; Future -     Hemoglobin A1c  Cough- He has worsening symptoms of pulmonary fibrosis and COPD.  He is not willing to use the LAMA. -     DG Chest 2 View; Future  ANEMIA, B12 DEFICIENCY- His H&H are normal now. -     CBC with Differential/Platelet; Future -     CBC with Differential/Platelet  Routine general medical examination at a health care facility- Exam completed, labs reviewed, vaccines reviewed and updated, no cancer screenings are indicated, patient education material is given.  Pulmonary fibrosis (HCC)-he has worsening symptoms with this.  His pulse ox is down to 90%.  I recommended that he start O2 at 2 L/min. -     Ambulatory referral to [place]  Pure hypertriglyceridemia- Improvement noted.  Stage 3b chronic kidney disease- He has had a slight decline in his renal function.  I recommended that he stop taking the ARB and Metformin.  Subclinical hypothyroidism- He is asymptomatic with this.  I do not think thyroid replacement therapy is indicated.   I have discontinued Otila Back. Hunkele's predniSONE, Janumet XR, and losartan. I am also having him maintain his Chlorpheniramine-Acetaminophen (CORICIDIN HBP COLD/FLU PO), Multiple Vitamins-Minerals (VITEYES AREDS ADVANCED PO), famotidine, acetaminophen, aspirin, cetirizine, cyanocobalamin, Multiple Vitamins-Minerals (CENTRUM SILVER PO), VITAMIN D PO, guaifenesin, Zinc,  Lonhala Magnair Starter Kit, pantoprazole, fenofibrate, Livalo, and atenolol.  No orders of the defined types were placed in this encounter.  In addition to time spent on CPE,  I spent 50 minutes in preparing to see the patient by review of recent labs, imaging and procedures, obtaining and reviewing separately obtained history, communicating with the patient and family or caregiver, ordering medications, tests or procedures, and documenting clinical information in the EHR including the differential Dx, treatment, and any further evaluation and other management of 1. Essential hypertension 2. Type 2 diabetes, uncontrolled, with renal manifestation ([place]) 3. Cough 4. ANEMIA, B12 DEFICIENY 5. Pulmonary fibrosis (HCC) 6. Pure hypertriglyceridemia 7. Stage 3b chronic kidney disease 8. Subclinical hypothyroidism    Follow-up: Return in about 3 months (around 07/16/2020).  Scarlette Calico, MD

## 2020-04-17 ENCOUNTER — Encounter: Payer: Self-pay | Admitting: Internal Medicine

## 2020-04-26 ENCOUNTER — Telehealth: Payer: Self-pay | Admitting: Internal Medicine

## 2020-04-26 NOTE — Telephone Encounter (Signed)
Called patient and explained that his oxygen level was not low enough at the visit to qualify him for oxygen. Pt has been scheduled for OV on Wednesday the 19th.

## 2020-04-26 NOTE — Telephone Encounter (Signed)
   Patient calling to request order for home oxygen as discussed in last office visit Patient request order be sent to [facility] [phone] Fax [phone] attn Sharee Pimple

## 2020-05-07 ENCOUNTER — Ambulatory Visit: Payer: Medicare PPO | Admitting: Internal Medicine

## 2020-05-07 ENCOUNTER — Encounter: Payer: Self-pay | Admitting: Internal Medicine

## 2020-05-07 ENCOUNTER — Other Ambulatory Visit: Payer: Self-pay

## 2020-05-07 VITALS — BP 96/60 | HR 77 | Temp 97.0°F | Ht 70.0 in | Wt 164.0 lb

## 2020-05-07 DIAGNOSIS — J8410 Pulmonary fibrosis, unspecified: Secondary | ICD-10-CM | POA: Diagnosis not present

## 2020-05-07 DIAGNOSIS — J9611 Chronic respiratory failure with hypoxia: Secondary | ICD-10-CM | POA: Diagnosis not present

## 2020-05-07 NOTE — Assessment & Plan Note (Signed)
05/07/2020   Walked RA  2 laps @ approx 210ft with sats at rest at 95% and required 3lpm walking          Each maintenance medication was reviewed in detail including emphasizing most importantly the difference between maintenance and prns and under what circumstances the prns are to be triggered using an action plan format where appropriate.  Total time for H and P, chart review, counseling,  directly observing portions of ambulatory 02 saturation study/ and generating customized AVS unique to this acute  office visit / charting = 30 min

## 2020-05-07 NOTE — Patient Instructions (Addendum)
Pepcid (famtotidine)  20 mg one twice daily for now after breakfast and supper   Prednisone 10 mg take  4 each am x 2 days,   2 each am x 2 days,  1 each am x 2 days and stop   For now no 02 needed at rest or sleeping but try 3lpm with activity  Make sure you check your oxygen saturations at highest level of activity to be sure it stays over 90% and adjust upward to maintain this level if needed but remember to turn it back to previous settings when you stop (to conserve your supply).   Please schedule a follow up office visit in 2 weeks, sooner if needed  with all medications /inhalers/ solutions in hand so we can verify exactly what you are taking. This includes all medications from all doctors and over the counters

## 2020-05-07 NOTE — Progress Notes (Signed)
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 Pulmonary Consult    Referred by Dr. Unice Cobble. Pt states having SOB since Dec 2015 after developing URI. He gets SOB with doing yard work and with singing. He states that he also has some PND and occ cough with white to clear sputum.   abruptly ill week before xmas, sense of acute sinus congestion, sore throat chest burning during severe coughing fits assoc with infected tooth rx with clindamycin x 40 days and tooth better, all symptoms improved except now doe x flight of steps or yardwork or fast walking. rec Change prilosec to omeprazole 40 mg Take 30-60 min before first meal of the day and Pepcid ac 20 at bedtime until return GERD diet   12/27/2015  f/u ov/Nathan Howard re: PF/ no change ex tol Chief Complaint  Patient presents with  . Follow-up    CXR done today. Breathing is unchanged. No new co's today.   rec No change rx = ppi qam/ h2hs    06/25/2016  f/u ov/Nathan Howard re: PF/ maint on prilosec but worried about press so stopped pepcid Chief Complaint  Patient presents with  . Follow-up    PFT done today. Breathing is overall doing well.    able to work in the yard ok, some trouble bending over at waist, no limiting doe rec Continue prilosec otc 5m  Take 30-60 min before first meal of the day and Pepcid ac (famotidine) 20 mg one @  bedtime        05/12/2018  f/u ov/Nathan Howard re:  PF with high ESR  Chief Complaint  Patient presents with  . Follow-up    follow up PFT. breathing unchanged.  Dyspnea:   Yard work / house work all fine Cough: worse than usual since winter 2019 day > noct and worse with ex/ deep breath  Sleep: ok one pillow  rec Prednisone 10 mg  2 daily until better then 1 daily x one week and off> never took it We will apply for OFEV initiation  > pt decided against after reviewing info   06/28/2018  f/u ov/Nathan Howard re: PF  Chief Complaint  Patient presents with  . Follow-up    Breathing has improved some since the last visit. He has not started on prednisone yet.     Dyspnea:  Worse bending over picking up debris on ground Cough: rarely /still using cough drops   SABA use: none 02: none  rec Continue Try prilosec otc 227m Take 30-60 min before first meal of the day and Pepcid ac (famotidine) 20 mg one @  bedtime until return  GERD  Please remember to go to the lab department downstairs in the basement  for your tests - we will call you with the results when they are available. Please schedule a follow up visit in 2 months but call sooner if needed with pfts on return    - add: esr now double baseline to 66 so  rec pred 20 mg ceiling and floor of 0   09/01/2018  f/u ov/Nathan Howard re:  ? IPF  Chief Complaint  Patient presents with  . Follow-up    PFT results, feels like he has rattle in his chest, SHOB  Dyspnea:  Yard work Cough: w/in a week of taking pred 20 mg / day cough resolved and taper off by around July 22 2018 s flare  Sleeping: flat bed/ one pillow SABA use: none 02:  None   rec If breathing/ cough worsen restart prednisone 20 mg daily and when better reduce to 10 mg daily as your new "floor" but be sure to monitor your sugars if you need to restart prednisone  Please schedule a follow up office visit in 6 weeks, call sooner if needed with 6 min walk    11/29/2018  f/u ov/Nathan Howard re:  Chief Complaint  Patient presents with  . Follow-up    Cough has improved some. Breathing has also improved.    Dyspnea:  No change ex tol, admits not checking sats as rec Cough:  none Sleeping: fine SABA use: none 02:  None   rec Measure your oxygen saturation with the same activity as usual once a week - call if downward trend any lower than 88%  Please schedule a follow up visit in 4  months but call sooner if needed with pfts on return     06/22/2019  f/u ov/Nathan Howard re:  PF on no rx other than gerd   Dyspnea:  Minimum worse doe but not checking sats  Cough: dry / usually worse in spring  Sleeping: ok at hs on one big pillow SABA use: none  02: none  rec No change medications Monitor your 02 saturations while on routine walk and call if trending down    10/24/2019  f/u ov/Nathan Howard re: PF/ cough  Dyspnea: walking up from street 10 degrees sev hundred feet  does not check 02 Cough:  Worse when sob / never productive  Sleeping: ok flat, big pillow SABA use: inhalers don't help cough or sob  rec For cough > delsym 2 tsp every 12 hours as needed  Or robitussin but not both  For drainage / throat tickle try either zyrtec one at bedtime or  CHLORPHENIRAMINE  4 mg  (Chlortab 27m at [facility] be easiest to find in the green box)  take one every 4 hours as needed - available over the counter- may cause drowsiness so start with just a bedtime dose or two and see how you tolerate it before trying in daytime  GERD  Diet  Please schedule a follow up visit in 3 months but call sooner if needed  - consider gabapentin 100 tid if not responding    01/24/2020  f/u ov/Nathan Howard re: pf/ cough likely form bronchiectatic component  Chief Complaint  Patient presents with  . Follow-up    Increased cough with dark green sputum over the past month. He also reports increased SOB.   Dyspnea:  Not limited by breathing from desired activities  Unless coughing  Cough: severe x one month with purulent sputum esp in am  Sleeping: wedge pillow  SABA use: none  02: none  Assoc nasal congesiton but min discolored mucus rec Change prilosec to Pantoprazole (protonix) 40 mg   Take   30-60 min before first meal of the day and Pepcid (famotidine)  20 mg one after supper until return to office - this is the best way to tell  whether stomach acid is contributing to your problem.   GERD (REFLUX) Augmentin 875 mg take one pill twice daily  X 10 days Prednisone 10 mg take  4 each am x 2 days,   2 each am x 2 days,  1 each am x 2 days and  add gabapentin 100 mg three times daily   For cough use robissin DM as needed  Please schedule a follow up office visit in 4 weeks, call sooner if needed with all medications /inhalers/ solutions in hand so we can verify exactly what you are taking. This includes all medications from all doctors and over the [place] separate them into two bags:  the ones you take automatically, no matter what, vs the ones you take just when you feel you need them "BAG #2 is UP TO YOU"  - this will really help [place] help you take your medications more effectively.     02/21/2020  f/u ov/Nathan Howard re: pf /bronchiectasis / improved p augmentin/pred / brought meds separated into Dr Ronnald Ramp vs Dr Melvyn Novas vs otc bags  Chief Complaint  Patient presents with  . Follow-up    4wk f/u. Still has a semi-productive cough at times. Has not noticed a difference in SOB.   Dyspnea:  Mailbox and back ok / not checking  sats  Cough: some better - decided against taking gabapentin after read about side effects Sleeping: fine flat SABA use: none  02: none  rec  Make sure you check your oxygen saturations at highest level of activity to be sure it stays over 90%   Augmentin 875 mg take one pill twice daily  X 10 days - take at breakfast and supper with large glass of water Take Prednisone 4 for three days 3 for three days 2 for three days 1 for three days and stop  For cough use robissin DM as needed  Or Delsym but not both  Please schedule a follow up office visit in 4 weeks, call sooner if needed with all medications    05/07/2020  f/u ov/Nathan Howard re: pf/ worse off gerd rx / never too  pred or augmentin  Chief Complaint  Patient presents with  . Acute Visit    Increased SOB and cough-progressivley getting worse since the last visit.   Dyspnea:  25-50 ft x since April , never took prednisone as worried about shots for covid 19 Cough: daily and worse dry cough with activity  Sleeping: ok x 30 degrees  SABA use: none  02: none  Appetite poor, slt nausea some burning/ some constipation "it's a digestive problem, that's all I can tell you"    No obvious day to day or daytime variability or assoc excess/ purulent sputum or mucus plugs or hemoptysis or cp or chest tightness, subjective wheeze or overt sinus or hb symptoms.    without nocturnal  or early am exacerbation  of respiratory  c/o's or need for noct saba. Also denies any obvious fluctuation of symptoms with weather or environmental changes or other aggravating or alleviating factors except as outlined above   No unusual exposure hx or h/o childhood pna/ asthma or knowledge of premature birth.  Current Allergies, Complete Past Medical History, Past Surgical History, Family History, and Social History were reviewed in [employer] record.  ROS  The following are not active complaints unless bolded Hoarseness, sore throat, dysphagia, dental problems, itching, sneezing,  nasal congestion or discharge of excess mucus or purulent secretions, ear  ache,   fever, chills, sweats, unintended wt loss or wt gain, classically pleuritic or exertional cp,  orthopnea pnd or arm/hand swelling  or leg swelling, presyncope, palpitations, abdominal pain, anorexia, nausea, vomiting, diarrhea  or change in bowel habits or change in bladder habits, change in stools or change in urine, dysuria, hematuria,  rash, arthralgias, visual complaints, headache, numbness, weakness or ataxia or problems with walking or coordination,  change in mood or  memory.        Current Meds  Medication Sig  . acetaminophen (TYLENOL) 500 MG  tablet Take 500 mg by mouth every 6 (six) hours as needed.  Marland Kitchen aspirin 81 MG tablet Take 1 tablet (81 mg total) by mouth daily.  Marland Kitchen atenolol (TENORMIN) 25 MG tablet TAKE 1/2 TABLET BY MOUTH EVERY MORNING.  . cyanocobalamin 2000 MCG tablet Take 1 tablet (2,000 mcg total) by mouth daily.  . fenofibrate (TRICOR) 145 MG tablet TAKE ONE TABLET BY MOUTH ONCE DAILY.  Marland Kitchen guaifenesin (ROBITUSSIN) 100 MG/5ML syrup Take 200 mg by mouth 3 (three) times daily as needed for cough.  Marland Kitchen LIVALO 2 MG TABS TAKE 1 TABLET BY MOUTH ONCE A DAY.  . pantoprazole (PROTONIX) 40 MG tablet Not taking   . VITAMIN D PO Take 1 tablet by mouth daily.                   Objective:   Physical Exam  05/07/2020    164  02/21/2020      176  01/24/2020       176  10/24/2019     184  06/22/2019       185  05/16/2015     209 > 06/28/2015    213  > 12/27/2015   211 > 06/25/2016  206 > 12/26/2016  203> 06/26/2017  211 >   03/29/2018 199 >  04/26/2018  201 > 05/12/2018    201 > 06/28/2018   201 > 09/01/2018    195 > 11/29/2018  212     02/26/15 213 lb (96.616 kg)  02/12/15 213 lb 12 oz (96.956 kg)     Vital signs reviewed  05/07/2020  - Note at rest 02 sats  78% on RA      HEENT : pt wearing mask not removed for exam due to covid - 19 concerns.   NECK :  without JVD/Nodes/TM/ nl carotid upstrokes bilaterally   LUNGS: no acc muscle use,  Min barrel  contour chest wall with bilateral coarse insp crackles   s audible wheeze and  without cough on insp or exp maneuvers and min  Hyperresonant  to  percussion bilaterally     CV:  RRR  no s3 or murmur or increase in P2, and no edema   ABD:  soft and nontender with pos end  insp Hoover's  in the supine position. No bruits or organomegaly appreciated, bowel sounds nl  MS:   Nl gait/  ext warm without deformities, calf tenderness, cyanosis - Mild  clubbing No obvious joint restrictions   SKIN: warm and dry without lesions    NEURO:  alert, approp, nl sensorium with  no motor or cerebellar deficits  apparent.              I personally reviewed images and agree with radiology impression as follows:  CXR:   PA and Lateral 04/16/20  Stable chronic interstitial changes noted consistent with the patient's known pulmonary fibrosis. No acute abnormality identified.  Assessment & Plan:

## 2020-05-07 NOTE — Assessment & Plan Note (Signed)
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 -Smoking history encompassed less than 5 years. He's had no significant occupational exposures - 02/26/2015  Walked RA x 3 laps @ 185 ft each stopped due to  End of study , nl pace, no desats - 02/26/2015 collagen vasc screen sent > neg with esr 28  - 02/26/2015 rec max gerd rx/ diet and > improved 05/16/15  - 06/28/2015  VC 3.65 (85%) no obst and dlco 56 corrects  to 81  - 06/28/2015  Walked RA x 3 laps @ 185 ft each stopped due to End of study, nl pace, no sob or desat  - PFT's  06/25/2016   VC  3.85  (91%)  DLCO  53/51 % corrects to 72 % for alv volume  - 12/26/2016  Walked RA x 3 laps @ 185 ft each stopped due to  End of study, nl pace, no sob /sats 91% at end   - PFTs  06/26/2017   VC  3.33 (79%)   DLCO  62/59  %  Corrects to 93%    - 12/28/2017  6 m walk  =  336 sats ok   - HRCT chest 03/25/18 :  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 - 04/26/2018  Walked RA x 3 laps @ 185 ft each stopped due to  End of study, nl pace, sat 92% at end, min sob  - PFT's  05/12/2018  VC 3.08 (74%)  w obst DLCO  43 % corrects to 77  % for alv volume   - ESR 49  04/26/17 so 05/12/2018  rec pred 20 mg daily until cough better then 10 mg x one week and stop > never took it  - 05/12/2018 initiate ofev paperwork > declined  - 06/28/2018  Walked RA x 3 laps @ 185 ft each stopped due to  End of study, nl pace, no sob but  desat  To as low as 88%   - ESR 06/28/2018 up to 66 rec pred 20 mg per day until breathing better then taper off> cough resolved   - PFT's  09/01/2018  FVC  2.93(70%)   p no % improvement from saba p no prior to study  / not able to do dlco    - ESR 51 off prednisone x 2 weeks  - 09/01/2018  Walked RA x 3 laps @ 185 ft each stopped due to  End of study, slow pace, no sob and sats 89% at end   - 11/29/18:  6mw  =  408 meters, lowest sat was  88%  - 06/22/2019   Walked RA  2 laps @  approx 250ft each @ avg pace  stopped due to  End of study, sats 88%, min sob  - 10/24/2019   Walked RA  2 laps @  approx 250ft each @ avg pace  stopped due to  End of study, sats 87% no sob - 05/07/2020 started on amb 02 see sep a/p         Much worse since stopped gerd rx ? Cause and effect ?   > restart at least pepcid 20 mg bid and if no gi flare then add back q am ppi at next ov in 2 weeks and in meantime go ahead and take the 6 days of prednisone already rec.  

## 2020-05-09 ENCOUNTER — Ambulatory Visit: Payer: Medicare PPO | Admitting: Internal Medicine

## 2020-05-09 DIAGNOSIS — J9611 Chronic respiratory failure with hypoxia: Secondary | ICD-10-CM | POA: Diagnosis not present

## 2020-05-11 ENCOUNTER — Other Ambulatory Visit: Payer: Self-pay | Admitting: Internal Medicine

## 2020-05-22 ENCOUNTER — Other Ambulatory Visit: Payer: Self-pay | Admitting: Internal Medicine

## 2020-05-22 ENCOUNTER — Encounter: Payer: Self-pay | Admitting: Internal Medicine

## 2020-05-22 ENCOUNTER — Other Ambulatory Visit (HOSPITAL_COMMUNITY)
Admission: RE | Admit: 2020-05-22 | Discharge: 2020-05-22 | Disposition: A | Discharge status: Home / Self Care | Payer: Medicare PPO | Source: Ambulatory Visit | Attending: Internal Medicine | Admitting: Internal Medicine

## 2020-05-22 ENCOUNTER — Other Ambulatory Visit: Payer: Self-pay

## 2020-05-22 ENCOUNTER — Ambulatory Visit: Payer: Medicare PPO | Admitting: Internal Medicine

## 2020-05-22 DIAGNOSIS — J8410 Pulmonary fibrosis, unspecified: Secondary | ICD-10-CM

## 2020-05-22 DIAGNOSIS — J9611 Chronic respiratory failure with hypoxia: Secondary | ICD-10-CM | POA: Diagnosis not present

## 2020-05-22 DIAGNOSIS — R05 Cough: Secondary | ICD-10-CM | POA: Diagnosis not present

## 2020-05-22 DIAGNOSIS — R059 Cough, unspecified: Secondary | ICD-10-CM

## 2020-05-22 LAB — CBC WITH DIFFERENTIAL/PLATELET
Abs Immature Granulocytes: 0.05 10*3/uL (ref 0.00–0.07)
Basophils Absolute: 0.1 10*3/uL (ref 0.0–0.1)
Basophils Relative: 1 %
Eosinophils Absolute: 0.2 10*3/uL (ref 0.0–0.5)
Eosinophils Relative: 2 %
HCT: 47.3 % (ref 39.0–52.0)
Hemoglobin: 14.8 g/dL (ref 13.0–17.0)
Immature Granulocytes: 1 %
Lymphocytes Relative: 31 %
Lymphs Abs: 2.8 10*3/uL (ref 0.7–4.0)
MCH: 29.1 pg (ref 26.0–34.0)
MCHC: 31.3 g/dL (ref 30.0–36.0)
MCV: 92.9 fL (ref 80.0–100.0)
Monocytes Absolute: 0.5 10*3/uL (ref 0.1–1.0)
Monocytes Relative: 5 %
Neutro Abs: 5.4 10*3/uL (ref 1.7–7.7)
Neutrophils Relative %: 60 %
Platelets: 288 10*3/uL (ref 150–400)
RBC: 5.09 MIL/uL (ref 4.22–5.81)
RDW: 14.6 % (ref 11.5–15.5)
WBC: 8.9 10*3/uL (ref 4.0–10.5)
nRBC: 0 % (ref 0.0–0.2)

## 2020-05-22 LAB — BASIC METABOLIC PANEL
Anion gap: 8 (ref 5–15)
BUN: 30 mg/dL — ABNORMAL HIGH (ref 8–23)
CO2: 27 mmol/L (ref 22–32)
Calcium: 9.8 mg/dL (ref 8.9–10.3)
Chloride: 99 mmol/L (ref 98–111)
Creatinine, Ser: 1.39 mg/dL — ABNORMAL HIGH (ref 0.61–1.24)
GFR calc Af Amer: 55 mL/min — ABNORMAL LOW (ref >60)
GFR calc non Af Amer: 47 mL/min — ABNORMAL LOW (ref >60)
Glucose, Bld: 205 mg/dL — ABNORMAL HIGH (ref 70–99)
Potassium: 4.9 mmol/L (ref 3.5–5.1)
Sodium: 134 mmol/L — ABNORMAL LOW (ref 135–145)

## 2020-05-22 LAB — TSH: TSH: 3.116 u[IU]/mL (ref 0.350–4.500)

## 2020-05-22 LAB — SEDIMENTATION RATE: Sed Rate: 25 mm/hr — ABNORMAL HIGH (ref 0–16)

## 2020-05-22 NOTE — Progress Notes (Signed)
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 Pulmonary Consult    Referred by Dr. Unice Cobble. Pt states having SOB since Dec 2015 after developing URI. He gets SOB with doing yard work and with singing. He states that he also has some PND and occ cough with white to clear sputum.   abruptly ill week before xmas, sense of acute sinus congestion, sore throat chest burning during severe coughing fits assoc with infected tooth rx with clindamycin x 40 days and tooth better, all symptoms improved except now doe x flight of steps or yardwork or fast walking. rec Change prilosec to omeprazole 40 mg Take 30-60 min before first meal of the day and Pepcid ac 20 at bedtime until return GERD diet   12/27/2015  f/u ov/Shakedra Beam re: PF/ no change ex tol Chief Complaint  Patient presents with  . Follow-up    CXR done today. Breathing is unchanged. No new co's today.   rec No change rx = ppi qam/ h2hs    06/25/2016  f/u ov/Mearl Olver re: PF/ maint on prilosec but worried about press so stopped pepcid Chief Complaint  Patient presents with  . Follow-up    PFT done today. Breathing is overall doing well.    able to work in the yard ok, some trouble bending over at waist, no limiting doe rec Continue prilosec otc 5m  Take 30-60 min before first meal of the day and Pepcid ac (famotidine) 20 mg one @  bedtime        05/12/2018  f/u ov/Vela Render re:  PF with high ESR  Chief Complaint  Patient presents with  . Follow-up    follow up PFT. breathing unchanged.  Dyspnea:   Yard work / house work all fine Cough: worse than usual since winter 2019 day > noct and worse with ex/ deep breath  Sleep: ok one pillow  rec Prednisone 10 mg  2 daily until better then 1 daily x one week and off> never took it We will apply for OFEV initiation  > pt decided against after reviewing info   06/28/2018  f/u ov/Betsie Peckman re: PF  Chief Complaint  Patient presents with  . Follow-up    Breathing has improved some since the last visit. He has not started on prednisone yet.     Dyspnea:  Worse bending over picking up debris on ground Cough: rarely /still using cough drops   SABA use: none 02: none  rec Continue Try prilosec otc 227m Take 30-60 min before first meal of the day and Pepcid ac (famotidine) 20 mg one @  bedtime until return  GERD  Please remember to go to the lab department downstairs in the basement  for your tests - we will call you with the results when they are available. Please schedule a follow up visit in 2 months but call sooner if needed with pfts on return    - add: esr now double baseline to 66 so  rec pred 20 mg ceiling and floor of 0   09/01/2018  f/u ov/Jenessa Gillingham re:  ? IPF  Chief Complaint  Patient presents with  . Follow-up    PFT results, feels like he has rattle in his chest, SHOB  Dyspnea:  Yard work Cough: w/in a week of taking pred 20 mg / day cough resolved and taper off by around July 22 2018 s flare  Sleeping: flat bed/ one pillow SABA use: none 02:  None   rec If breathing/ cough worsen restart prednisone 20 mg daily and when better reduce to 10 mg daily as your new "floor" but be sure to monitor your sugars if you need to restart prednisone  Please schedule a follow up office visit in 6 weeks, call sooner if needed with 6 min walk    11/29/2018  f/u ov/Jalani Rominger re:  Chief Complaint  Patient presents with  . Follow-up    Cough has improved some. Breathing has also improved.    Dyspnea:  No change ex tol, admits not checking sats as rec Cough:  none Sleeping: fine SABA use: none 02:  None   rec Measure your oxygen saturation with the same activity as usual once a week - call if downward trend any lower than 88%  Please schedule a follow up visit in 4  months but call sooner if needed with pfts on return     06/22/2019  f/u ov/Tanzania Basham re:  PF on no rx other than gerd   Dyspnea:  Minimum worse doe but not checking sats  Cough: dry / usually worse in spring  Sleeping: ok at hs on one big pillow SABA use: none  02: none  rec No change medications Monitor your 02 saturations while on routine walk and call if trending down    10/24/2019  f/u ov/Bland Rudzinski re: PF/ cough  Dyspnea: walking up from street 10 degrees sev hundred feet  does not check 02 Cough:  Worse when sob / never productive  Sleeping: ok flat, big pillow SABA use: inhalers don't help cough or sob  rec For cough > delsym 2 tsp every 12 hours as needed  Or robitussin but not both  For drainage / throat tickle try either zyrtec one at bedtime or  CHLORPHENIRAMINE  4 mg  (Chlortab 27m at [facility] [employer] be easiest to find in the green box)  take one every 4 hours as needed - available over the counter- may cause drowsiness so start with just a bedtime dose or two and see how you tolerate it before trying in daytime  GERD  Diet  Please schedule a follow up visit in 3 months but call sooner if needed  - consider gabapentin 100 tid if not responding    01/24/2020  f/u ov/Alain Deschene re: pf/ cough likely form bronchiectatic component  Chief Complaint  Patient presents with  . Follow-up    Increased cough with dark green sputum over the past month. He also reports increased SOB.   Dyspnea:  Not limited by breathing from desired activities  Unless coughing  Cough: severe x one month with purulent sputum esp in am  Sleeping: wedge pillow  SABA use: none  02: none  Assoc nasal congesiton but min discolored mucus rec Change prilosec to Pantoprazole (protonix) 40 mg   Take   30-60 min before first meal of the day and Pepcid (famotidine)  20 mg one after supper until return to office - this is the best way to tell  whether stomach acid is contributing to your problem.   GERD (REFLUX) Augmentin 875 mg take one pill twice daily  X 10 days Prednisone 10 mg take  4 each am x 2 days,   2 each am x 2 days,  1 each am x 2 days and  add gabapentin 100 mg three times daily   For cough use robissin DM as needed  Please schedule a follow up office visit in 4 weeks, call sooner if needed with all medications /inhalers/ solutions in hand so we can verify exactly what you are taking. This includes all medications from all doctors and over the [place] separate them into two bags:  the ones you take automatically, no matter what, vs the ones you take just when you feel you need them "BAG #2 is UP TO YOU"  - this will really help [place] help you take your medications more effectively.     02/21/2020  f/u ov/Wake Conlee re: pf /bronchiectasis / improved p augmentin/pred / brought meds separated into Dr Ronnald Ramp vs Dr Melvyn Novas vs otc bags  Chief Complaint  Patient presents with  . Follow-up    4wk f/u. Still has a semi-productive cough at times. Has not noticed a difference in SOB.   Dyspnea:  Mailbox and back ok / not checking  sats  Cough: some better - decided against taking gabapentin after read about side effects Sleeping: fine flat SABA use: none  02: none  rec  Make sure you check your oxygen saturations at highest level of activity to be sure it stays over 90%   Augmentin 875 mg take one pill twice daily  X 10 days - take at breakfast and supper with large glass of water Take Prednisone 4 for three days 3 for three days 2 for three days 1 for three days and stop  For cough use robissin DM as needed  Or Delsym but not both  Please schedule a follow up office visit in 4 weeks, call sooner if needed with all medications    05/07/2020  f/u ov/Ulyana Pitones re: pf/ worse off gerd rx / never too  pred or augmentin  Chief Complaint  Patient presents with  . Acute Visit    Increased SOB and cough-progressivley getting worse since the last visit.   Dyspnea:  25-50 ft x since April , never took prednisone as worried about shots for covid 19 Cough: daily and worse dry cough with activity  Sleeping: ok x 30 degrees  SABA use: none  02: none  Appetite poor, slt nausea some burning/ some constipation "it's a digestive problem, that's all I can tell you"  rec Pepcid (famtotidine)  20 mg one twice daily for now after breakfast and supper  Prednisone 10 mg take  4 each am x 2 days,   2 each am x 2 days,  1 each am x 2 days and stop  For now no 02 needed at rest or sleeping but try 3lpm with activity Make sure you check your oxygen saturations at highest level of activity to be sure it stays over 90%  Please schedule a follow up office visit in 2 weeks, sooner if needed  with all medications   05/22/2020  f/u ov/Javana Schey re:  Pf/ unexplained wt loss  Chief Complaint  Patient presents with  . Follow-up    Breathing is some better and he is coughing less.   Dyspnea:  Worked outside for a couple of hours s 02 off 02  p on prednisone  Cough: less / sporadic pattern/ assoc with watery pnds despite zyrtec and never tried h1  Sleeping: bed is flat wedge pillow 30 degrees sleeps fine  SABA use:  None  02: only uses 02 after exertion    No obvious day to day or daytime variability or assoc excess/ purulent sputum or mucus plugs or hemoptysis or cp or chest tightness, subjective wheeze or overt hb symptoms.   Sleeping as above without nocturnal  or early am exacerbation  of respiratory  c/o's or need for noct saba. Also denies any obvious fluctuation of symptoms with weather or environmental changes or other aggravating or alleviating factors except as outlined above   No unusual exposure hx or h/o childhood pna/ asthma or knowledge of premature birth.  Current Allergies, Complete Past Medical  History, Past Surgical History, Family History, and Social History were reviewed in [employer] record.  ROS  The following are not active complaints unless bolded Hoarseness, sore throat, dysphagia, dental problems, itching, sneezing,  nasal congestion or discharge of excess mucus watery or purulent secretions, ear ache,   fever, chills, sweats, unintended wt loss or wt gain, classically pleuritic or exertional cp,  orthopnea pnd or arm/hand swelling  or leg swelling, presyncope, palpitations, abdominal pain, anorexia, nausea, vomiting, diarrhea  or change in bowel habits or change in bladder habits, change in stools or change in urine, dysuria, hematuria,  rash, arthralgias, visual complaints, headache, numbness, weakness or ataxia or problems with walking or coordination,  change in mood or  memory.        Current Meds  Medication Sig  . acetaminophen (TYLENOL) 500 MG tablet Take 500 mg by mouth every 6 (six) hours as needed.  . Ascorbic Acid (VITAMIN C) 100 MG tablet Take 100 mg by mouth daily.  Marland Kitchen aspirin 81 MG tablet Take 1 tablet (81 mg total) by mouth daily.  Marland Kitchen atenolol (TENORMIN) 25 MG tablet TAKE 1/2 TABLET BY MOUTH EVERY MORNING.  . cetirizine (ZYRTEC) 10 MG tablet Take 10 mg by mouth daily.  . cyanocobalamin 2000 MCG tablet Take 1 tablet (2,000 mcg total) by mouth daily.  Marland Kitchen docusate sodium (COLACE) 100 MG capsule Take 100 mg by mouth 2 (two) times daily.  . famotidine (PEPCID) 20 MG tablet Take 20 mg by mouth at bedtime.  . fenofibrate (TRICOR) 145 MG tablet TAKE ONE TABLET BY MOUTH ONCE DAILY.  Marland Kitchen guaifenesin (ROBITUSSIN) 100 MG/5ML syrup Take 200 mg by mouth 3 (three) times daily as needed for cough.  Marland Kitchen LIVALO 2 MG TABS TAKE 1 TABLET BY MOUTH ONCE A DAY.  Marland Kitchen losartan (COZAAR) 100 MG tablet Take 50 mg by mouth daily.  . Multiple Vitamins-Minerals (ICAPS AREDS FORMULA PO) Take 1 tablet by mouth daily.  . Multiple Vitamins-Minerals (MULTIVITAMIN WITH MINERALS)  tablet Take 1 tablet by mouth daily.  Marland Kitchen VITAMIN D PO Take 1 tablet by mouth daily.                    Objective:   Physical Exam   05/22/2020      158  05/07/2020    164  02/21/2020      176  01/24/2020       176  10/24/2019     184  06/22/2019       185  05/16/2015     209 > 06/28/2015    213  > 12/27/2015   211 > 06/25/2016  206 > 12/26/2016  203> 06/26/2017  211 >   03/29/2018 199 >  04/26/2018  201 > 05/12/2018    201 > 06/28/2018   201 > 09/01/2018    195 > 11/29/2018  212     02/26/15 213 lb (96.616 kg)  02/12/15 213 lb 12 oz (96.956 kg)      Elderly amb wm nad   Vital signs reviewed  05/22/2020  - Note at rest 02 sats  93% on RA       HEENT : pt wearing mask not removed for exam due to covid - 19 concerns.   NECK :  without JVD/Nodes/TM/ nl carotid upstrokes bilaterally   LUNGS: no acc muscle use,  Min barrel  contour chest wall with bilateral  slightly decreased bs s audible wheeze and  With late  insp crackles and dry cough on insp   maneuvers and min  Hyperresonant  to  percussion bilaterally     CV:  RRR  no s3 or murmur or increase in P2, and no edema   ABD:  soft and nontender with pos end  insp Hoover's  in the supine position. No bruits or organomegaly appreciated, bowel sounds nl  MS:   Nl gait/  ext warm without deformities, calf tenderness, cyanosis or clubbing No obvious joint restrictions   SKIN: warm and dry without lesions    NEURO:  alert, approp, nl sensorium with  no motor or cerebellar deficits apparent.          Labs ordered/ reviewed:      Chemistry      Component Value Date/Time   NA 134 (L) 05/22/2020 0949   K 4.9 05/22/2020 0949   CL 99 05/22/2020 0949   CO2 27 05/22/2020 0949   BUN 30 (H) 05/22/2020 0949   CREATININE 1.39 (H) 05/22/2020 0949   CREATININE 1.73 (H) 02/18/2013 1701      Component Value Date/Time   CALCIUM 9.8 05/22/2020 0949    GLucose (not fasting) 205 05/22/2020                        Lab Results  Component Value Date    WBC 8.9 05/22/2020   HGB 14.8 05/22/2020   HCT 47.3 05/22/2020   MCV 92.9 05/22/2020   PLT 288 05/22/2020        EOS                                                              0.2                                     05/22/2020     Lab Results  Component Value Date   TSH 3.116 05/22/2020          Lab Results  Component Value Date   ESRSEDRATE 25 (H) 05/22/2020   ESRSEDRATE 51 (H) 09/01/2018   ESRSEDRATE 66 (H) 06/28/2018              Assessment & Plan:

## 2020-05-22 NOTE — Assessment & Plan Note (Signed)
05/07/2020   Walked RA  2 laps @ approx 254ft with sats at rest at 95% and required 3lpm walking - Vital signs reviewed  05/22/2020  - Note at rest 02 sats  93% on   Has not been monitoring 02 sats as rec > repeated advise: Make sure you check your oxygen saturations at highest level of activity to be sure it stays over 90% and adjust upward to maintain this level if needed but remember to turn it back to previous settings when you stop (to conserve your supply).    F/u a 6 weeks          Each maintenance medication was reviewed in detail including emphasizing most importantly the difference between maintenance and prns and under what circumstances the prns are to be triggered using an action plan format where appropriate.  Total time for H and P, chart review, counseling, teaching device use (portable 02)  and generating customized AVS unique to this office visit / charting = 30 min

## 2020-05-22 NOTE — Assessment & Plan Note (Addendum)
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 -Smoking history encompassed less than 5 years with  no significant occupational exposures - 02/26/2015  Walked RA x 3 laps @ 185 ft each stopped due to  End of study , nl pace, no desats - 02/26/2015 collagen vasc screen sent > neg with esr 28  - 02/26/2015 rec max gerd rx/ diet and > improved 05/16/15  - 06/28/2015  VC 3.65 (85%) no obst and dlco 56 corrects  to 81  - 06/28/2015  Walked RA x 3 laps @ 185 ft each stopped due to End of study, nl pace, no sob or desat  - PFT's  06/25/2016   VC  3.85  (91%)  DLCO  53/51 % corrects to 72 % for alv volume  - 12/26/2016  Walked RA x 3 laps @ 185 ft each stopped due to  End of study, nl pace, no sob /sats 91% at end   - PFTs  06/26/2017   VC  3.33 (79%)   DLCO  62/59  %  Corrects to 93%    - 12/28/2017  6 m walk  =  336 sats ok   - HRCT chest 03/25/18 :  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 - 04/26/2018  Walked RA x 3 laps @ 185 ft each stopped due to  End of study, nl pace, sat 92% at end, min sob  - PFT's  05/12/2018  VC 3.08 (74%)  w obst DLCO  43 % corrects to 77  % for alv volume   - ESR 49  04/26/17 so 05/12/2018  rec pred 20 mg daily until cough better then 10 mg x one week and stop > never took it  - 05/12/2018 initiate ofev paperwork > declined  - 06/28/2018  Walked RA x 3 laps @ 185 ft each stopped due to  End of study, nl pace, no sob but  desat  To as low as 88%   - ESR 06/28/2018 up to 66 rec pred 20 mg per day until breathing better then taper off> cough resolved   - PFT's  09/01/2018  FVC  2.93(70%)   p no % improvement from saba p no prior to study  / not able to do dlco    - ESR 51 off prednisone x 2 weeks  - 09/01/2018  Walked RA x 3 laps @ 185 ft each stopped due to  End of study, slow pace, no sob and sats 89% at end   - 11/29/18:  38m  = 408  meters, lowest sat was  88%  - 06/22/2019   Walked RA  2 laps @  approx 2523feach @ avg pace  stopped due to  End of study, sats 88%, min sob  - 10/24/2019   Walked RA  2 laps @  approx 25016fach @ avg pace  stopped due to  End of study, sats 87% no sob - 05/07/2020 started on amb 02 see sep a/p   - 05/07/20  pred x 6 days > much better clinically to point where worked in yard for 2 h s 02 - 05/22/2020 recheck ESR =   With dm/wt loss reluctant to empirically place back on prednisone but clearly responded both with doe and sats -cough some better also but mostly related to pnds - (see separate a/p)   Will recheck baseline ESR and non-fasting blood sugar  to sort out whether poorly controlled DM contributing to wt loss in which case may need more aggressive rx if put back on prednisone (prev well controlled by hgb A1C as recently as last month but that was before prednisone)   Add:  Esr only 25 p last round of prednisone and non fasting BG = 205 so hold off on more pred for now   

## 2020-05-22 NOTE — Patient Instructions (Addendum)
Continue automatic:  pepcid (famtotidine)  20 mg one twice daily for now after breakfast and supper  - supplement with maalox plus as needed  Eat richer food fried chicken /burgers/fries / mashed potatoes with gravy   For now no 02 needed at rest or sleeping   Best cough medication is delsym 2 tsp every 12 hours as needed and not robitussin   Instead of zyrtec:  For drainage / throat tickle try take CHLORPHENIRAMINE  4 mg  (Chlortab 4mg   at [employer] should be easiest to find in the green box)  take one every 4 hours as needed - available over the counter- may cause drowsiness so start with just a bedtime dose or two and see how you tolerate it before trying in daytime    Make sure you check your oxygen saturations at highest level of activity to be sure it stays over 90% and adjust upward to maintain this level if needed but remember to turn it back to previous settings when you stop (to conserve your supply).   See your PCP re weight loss which may be diabetes related    Please remember to go to the lab department @ [facility]  for your tests - we will call you with the results when they are available.       Please schedule a follow up office visit in 6 weeks, call sooner if needed

## 2020-05-22 NOTE — Assessment & Plan Note (Signed)
Allergy profile 09/01/2018 >  Eos 0.3 /  IgE  19 RAST neg -  Gabapentin 100 mg tid added 01/25/2020  Plus max ppi > GI side effects so d/c'd  - trial of 1st gen H1 blockers per guidelines  If tolerates then consider ent eval for pnds  For now plan to keep it simple and use available otc's with option of adding nasal atrovent if this is vasomotor rhinitis (defer to  Dr Ronnald Ramp whether to rx empirically or send to ENT first.

## 2020-05-23 NOTE — Progress Notes (Signed)
Spoke with pt and notified of results per Dr. Wert. Pt verbalized understanding and denied any questions. 

## 2020-06-09 DIAGNOSIS — J9611 Chronic respiratory failure with hypoxia: Secondary | ICD-10-CM | POA: Diagnosis not present

## 2020-06-26 ENCOUNTER — Telehealth: Payer: Self-pay | Admitting: Internal Medicine

## 2020-06-26 NOTE — Telephone Encounter (Signed)
Called and spoke with pt letting him know the info stated by Aaron Edelman and he verbalized understanding. Routing this to Dr. Elsworth Soho as an Juluis Rainier per Aaron Edelman since pt is usually a Dr. Melvyn Novas pt.  Pt is scheduled to see Dr. Elsworth Soho Monday 7/12 at 9:30 in [place].

## 2020-06-26 NOTE — Telephone Encounter (Signed)
06/26/2020  Patient followed in our office by Dr. Melvyn Novas for pulmonary fibrosis, chronic cough and respiratory failure.  Patient absolutely needs to be wearing his oxygen 24/7 as previously requested.  His symptoms are likely directly related to worsened hypoxia due to not wearing his oxygen.  I have no further recommendations other than being adherent to his oxygen as previously instructed by our office.  Keep upcoming appointment with Dr. Elsworth Soho.  Please route message to Dr. Elsworth Soho as Juluis Rainier as this patient is typically followed by Dr. Melvyn Novas.  If symptoms worsen he needs to seek emergent evaluation at the emergency room.  Patient also should invest in purchasing a pulse oximeter for him to use at home to monitor oxygen levels.  Wyn Quaker, FNP
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
## 2020-06-26 NOTE — Telephone Encounter (Signed)
OK 

## 2020-06-27 ENCOUNTER — Ambulatory Visit: Payer: Medicare PPO | Admitting: Pulmonary Disease

## 2020-07-02 ENCOUNTER — Encounter: Payer: Self-pay | Admitting: Pulmonary Disease

## 2020-07-02 ENCOUNTER — Other Ambulatory Visit: Payer: Self-pay

## 2020-07-02 ENCOUNTER — Ambulatory Visit: Payer: Medicare PPO | Admitting: Pulmonary Disease

## 2020-07-02 ENCOUNTER — Ambulatory Visit (HOSPITAL_COMMUNITY)
Admission: RE | Admit: 2020-07-02 | Discharge: 2020-07-02 | Disposition: A | Discharge status: Home / Self Care | Payer: Medicare PPO | Source: Ambulatory Visit | Attending: Pulmonary Disease | Admitting: Pulmonary Disease

## 2020-07-02 DIAGNOSIS — J8410 Pulmonary fibrosis, unspecified: Secondary | ICD-10-CM

## 2020-07-02 DIAGNOSIS — J9611 Chronic respiratory failure with hypoxia: Secondary | ICD-10-CM

## 2020-07-02 DIAGNOSIS — R0602 Shortness of breath: Secondary | ICD-10-CM | POA: Diagnosis not present

## 2020-07-02 MED ORDER — PREDNISONE 10 MG PO TABS
ORAL_TABLET | ORAL | 0 refills | Status: DC
Start: 2020-07-02 — End: 2020-08-01

## 2020-07-02 MED ORDER — IPRATROPIUM-ALBUTEROL 0.5-2.5 (3) MG/3ML IN SOLN: 3.0000 mL | Freq: Four times a day (QID) | RESPIRATORY_TRACT | 1 refills | Status: DC | PRN

## 2020-07-02 NOTE — Assessment & Plan Note (Addendum)
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 We again discussed antifibrotic's and he is not willing to accept side effects of nausea or diarrhea   Prednisone 10 mg tabs Take 4 tabs  daily with food x 4 days, then 3 tabs daily x 4 days, then 2 tabs daily x 4 days, then 1 tab daily x4 days then stop. #40  CXR today  Last PFTs have shown some airway obstruction so we will attempt nebulizer Chest x-ray to clarify that he is not having any infection although this seems unlikely Prescription for nebulizer DuoNebs twice daily as needed # 60 x 1 refill

## 2020-07-02 NOTE — Addendum Note (Signed)
Addended by: Lia Foyer R on: 07/02/2020 10:49 AM   Modules accepted: Orders

## 2020-07-02 NOTE — Patient Instructions (Signed)
  You are having a flareup of pulmonary fibrosis with worsening oxygen levels  Trial of 4 L oxygen during ambulation, okay to keep 2L at rest. Prednisone 10 mg tabs Take 4 tabs  daily with food x 4 days, then 3 tabs daily x 4 days, then 2 tabs daily x 4 days, then 1 tab daily x4 days then stop. #40  Prescription for nebulizer DuoNebs twice daily as needed # 60 x 1 refill  We discussed your living will and healthcare POA paperwork

## 2020-07-02 NOTE — Progress Notes (Signed)
   Subjective:    Patient ID: Nathan Howard, male    DOB: 08-Aug-1941, 79 y.o.   MRN: [id number]  HPI  Chief Complaint  Patient presents with  . Follow-up    Patient is here for shortness of breath with exertion. Patient wears 2 liters oxygen all the time. Patient was at 75% on 2 liters walking into building from parking lot. 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 I note a diagnosis of IPF around 2019 although symptoms have been ongoing since 2016.  He was noted to desaturate around 2020 and started on oxygen 04/2020  Over the past few weeks his oxygenation has gotten worse as has his breathing.  He arrived at 75% on 2 L ambulating today and is finding it difficult to walk around even at home, recovered with resting to 94%. He reports problems with his concentrator thumping and cutting off at home and would like DME assistance. He reports left-sided pleuritic chest pain radiating to shoulder blade  He is in a difficult social situation, wife has severe fibromyalgia and is preparing to go into assisted living, [facility], no kids.    He has a living will    Significant tests/ events reviewed  HRCT chest 03/25/18 :  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  - PFT's  05/12/2018  VC 3.08 (74%)  w obst DLCO  43 % corrects to 77  % for alv volume    PFTs 08/2018 ratio 67, FEV1 66%, FVC 70%   Past Medical History:  Diagnosis Date  . Anal fissure   . Anemia   . Arthritis   . Colitis 2011   Dr Sharlett Iles  . Diverticulosis of colon (without mention of hemorrhage)   . Esophageal reflux   . Hemorrhoids   . Hyperlipemia   . Hypertension   . Personal history of colonic polyps 2000   adenomatous polyp Dr Velora Heckler  . Pneumonia   . Pneumonia     X 2   , as child  & 1974  . Renal insufficiency   . Skin cancer    right ear  . Type II or unspecified type diabetes mellitus without mention of complication, not stated as uncontrolled   . Vitamin B12 deficiency      Review of Systems neg for any significant sore throat, dysphagia, itching, sneezing, nasal congestion or excess/ purulent secretions, fever, chills, sweats, unintended wt loss, pleuritic or exertional cp, hempoptysis, orthopnea pnd or change in chronic leg swelling.   Also denies presyncope, palpitations, heartburn, abdominal pain, nausea, vomiting, diarrhea or change in bowel or urinary habits, dysuria,hematuria, rash, arthralgias, visual complaints, headache, numbness weakness or ataxia.     Objective:   Physical Exam  Gen. Pleasant, well-nourished, in no distress ENT - no thrush, no pallor/icterus,no post nasal drip Neck: No JVD, no thyromegaly, no carotid bruits Lungs: no use of accessory muscles, no dullness to percussion, bibasal 1/3 rales no rhonchi  Cardiovascular: Rhythm regular, heart sounds  normal, no murmurs or gallops, no peripheral edema Musculoskeletal: No deformities, no cyanosis or clubbing        Assessment & Plan:

## 2020-07-02 NOTE — Assessment & Plan Note (Addendum)
flareup of pulmonary fibrosis with worsening oxygen levels in the last 3 to 4 weeks  Trial of 4 L oxygen during ambulation, okay to keep 2L at rest. We discussed your living will and healthcare POA paperwork -he would not want indefinite life support especially if deemed terminal  If no response to above measures, will pursue end-of-life conversation on next visit in 8 days.  He would be agreeable to outpatient palliative care

## 2020-07-09 DIAGNOSIS — J9611 Chronic respiratory failure with hypoxia: Secondary | ICD-10-CM | POA: Diagnosis not present

## 2020-07-10 ENCOUNTER — Ambulatory Visit: Payer: Medicare PPO | Admitting: Internal Medicine

## 2020-07-13 ENCOUNTER — Other Ambulatory Visit: Payer: Self-pay

## 2020-07-13 DIAGNOSIS — Z87891 Personal history of nicotine dependence: Secondary | ICD-10-CM | POA: Diagnosis not present

## 2020-07-13 DIAGNOSIS — Z7982 Long term (current) use of aspirin: Secondary | ICD-10-CM | POA: Diagnosis not present

## 2020-07-13 DIAGNOSIS — N1832 Chronic kidney disease, stage 3b: Secondary | ICD-10-CM | POA: Diagnosis not present

## 2020-07-13 DIAGNOSIS — K5901 Slow transit constipation: Secondary | ICD-10-CM | POA: Diagnosis not present

## 2020-07-13 DIAGNOSIS — E1122 Type 2 diabetes mellitus with diabetic chronic kidney disease: Secondary | ICD-10-CM | POA: Insufficient documentation

## 2020-07-13 DIAGNOSIS — Z85828 Personal history of other malignant neoplasm of skin: Secondary | ICD-10-CM | POA: Insufficient documentation

## 2020-07-13 DIAGNOSIS — K5900 Constipation, unspecified: Secondary | ICD-10-CM | POA: Diagnosis not present

## 2020-07-13 DIAGNOSIS — Z7984 Long term (current) use of oral hypoglycemic drugs: Secondary | ICD-10-CM | POA: Insufficient documentation

## 2020-07-13 DIAGNOSIS — I129 Hypertensive chronic kidney disease with stage 1 through stage 4 chronic kidney disease, or unspecified chronic kidney disease: Secondary | ICD-10-CM | POA: Diagnosis not present

## 2020-07-13 DIAGNOSIS — Z79899 Other long term (current) drug therapy: Secondary | ICD-10-CM | POA: Diagnosis not present

## 2020-07-13 DIAGNOSIS — J8410 Pulmonary fibrosis, unspecified: Secondary | ICD-10-CM | POA: Diagnosis not present

## 2020-07-14 ENCOUNTER — Telehealth: Payer: Self-pay

## 2020-07-14 ENCOUNTER — Other Ambulatory Visit: Payer: Self-pay

## 2020-07-14 ENCOUNTER — Encounter (HOSPITAL_COMMUNITY): Payer: Self-pay | Admitting: Emergency Medicine

## 2020-07-14 ENCOUNTER — Emergency Department (HOSPITAL_COMMUNITY)
Admission: EM | Admit: 2020-07-14 | Discharge: 2020-07-14 | Disposition: A | Discharge status: Home / Self Care | Payer: Medicare PPO | Attending: Emergency Medicine | Admitting: Emergency Medicine

## 2020-07-14 ENCOUNTER — Emergency Department (HOSPITAL_COMMUNITY): Payer: Medicare PPO

## 2020-07-14 DIAGNOSIS — K5900 Constipation, unspecified: Secondary | ICD-10-CM | POA: Diagnosis not present

## 2020-07-14 DIAGNOSIS — J8410 Pulmonary fibrosis, unspecified: Secondary | ICD-10-CM | POA: Diagnosis not present

## 2020-07-14 DIAGNOSIS — K5901 Slow transit constipation: Secondary | ICD-10-CM

## 2020-07-14 LAB — CBC WITH DIFFERENTIAL/PLATELET
Abs Immature Granulocytes: 0.07 10*3/uL (ref 0.00–0.07)
Basophils Absolute: 0.1 10*3/uL (ref 0.0–0.1)
Basophils Relative: 0 %
Eosinophils Absolute: 0.2 10*3/uL (ref 0.0–0.5)
Eosinophils Relative: 2 %
HCT: 48.1 % (ref 39.0–52.0)
Hemoglobin: 15.6 g/dL (ref 13.0–17.0)
Immature Granulocytes: 1 %
Lymphocytes Relative: 9 %
Lymphs Abs: 1.2 10*3/uL (ref 0.7–4.0)
MCH: 29.5 pg (ref 26.0–34.0)
MCHC: 32.4 g/dL (ref 30.0–36.0)
MCV: 91.1 fL (ref 80.0–100.0)
Monocytes Absolute: 0.8 10*3/uL (ref 0.1–1.0)
Monocytes Relative: 6 %
Neutro Abs: 10.8 10*3/uL — ABNORMAL HIGH (ref 1.7–7.7)
Neutrophils Relative %: 82 %
Platelets: 280 10*3/uL (ref 150–400)
RBC: 5.28 MIL/uL (ref 4.22–5.81)
RDW: 14.4 % (ref 11.5–15.5)
WBC: 13.1 10*3/uL — ABNORMAL HIGH (ref 4.0–10.5)
nRBC: 0 % (ref 0.0–0.2)

## 2020-07-14 LAB — POC OCCULT BLOOD, ED: Fecal Occult Bld: POSITIVE — AB

## 2020-07-14 LAB — BASIC METABOLIC PANEL
Anion gap: 8 (ref 5–15)
BUN: 36 mg/dL — ABNORMAL HIGH (ref 8–23)
CO2: 27 mmol/L (ref 22–32)
Calcium: 9.5 mg/dL (ref 8.9–10.3)
Chloride: 98 mmol/L (ref 98–111)
Creatinine, Ser: 1.42 mg/dL — ABNORMAL HIGH (ref 0.61–1.24)
GFR calc Af Amer: 53 mL/min — ABNORMAL LOW (ref >60)
GFR calc non Af Amer: 46 mL/min — ABNORMAL LOW (ref >60)
Glucose, Bld: 204 mg/dL — ABNORMAL HIGH (ref 70–99)
Potassium: 4 mmol/L (ref 3.5–5.1)
Sodium: 133 mmol/L — ABNORMAL LOW (ref 135–145)

## 2020-07-14 MED ORDER — MAGNESIUM CITRATE PO SOLN: 1.0000 | Freq: Once | ORAL | 0 refills | Status: AC

## 2020-07-14 MED ORDER — POLYETHYLENE GLYCOL 3350 17 G PO PACK
17.0000 g | PACK | Freq: Two times a day (BID) | ORAL | 0 refills | Status: AC | PRN
Start: 2020-07-14 — End: ?

## 2020-07-14 MED ORDER — SODIUM CHLORIDE 0.9 % IV BOLUS
500.0000 mL | Freq: Once | INTRAVENOUS | Status: AC
  Administered 2020-07-14: 500 mL via INTRAVENOUS

## 2020-07-14 NOTE — ED Notes (Signed)
Small bm after enema, MD made aware

## 2020-07-14 NOTE — Discharge Instructions (Signed)
Take the whole dose of magnesium citrate in the morning.  Start taking MiraLAX twice daily as needed until you have satisfactory bowel movements.  Follow-up with your primary doctor for ongoing issues with constipation.

## 2020-07-14 NOTE — Telephone Encounter (Signed)
lCalled [facility] to set up home health as they are one of preferred providers with [employer]. They will  Be able to provide services PT aide and social work to Nathan Howard on Wednesday. Since Mr.  Sharol Roussel just left hte hospital at 0300 will call a little later to let him know.

## 2020-07-14 NOTE — ED Provider Notes (Addendum)
[facility] EMERGENCY DEPARTMENT Provider Note   CSN: [id number] Arrival date & time: 07/13/20  2346     History Chief Complaint  Patient presents with  . Constipation    Nathan Howard is a 79 y.o. male.  Patient presents to the emergency department for evaluation of constipation.  Patient reports that his last bowel movement was July 18.  He reports a history of pulmonary fibrosis which has caused him to feel very weak.  He has been experiencing limited ability to strain to try to have a bowel movement.  He took multiple enemas tonight without any success.        Past Medical History:  Diagnosis Date  . Anal fissure   . Anemia   . Arthritis   . Colitis 2011   Dr Sharlett Iles  . Diverticulosis of colon (without mention of hemorrhage)   . Esophageal reflux   . Hemorrhoids   . Hyperlipemia   . Hypertension   . Personal history of colonic polyps 2000   adenomatous polyp Dr Velora Heckler  . Pneumonia   . Pneumonia     X 2  , as child  & 1974  . Renal insufficiency   . Skin cancer    right ear  . Type II or unspecified type diabetes mellitus without mention of complication, not stated as uncontrolled   . Vitamin B12 deficiency     Patient Active Problem List   Diagnosis Date Noted  . Chronic respiratory failure with hypoxia ([place]) 05/07/2020  . Stage 3b chronic kidney disease 04/16/2020  . Subclinical hypothyroidism 04/16/2020  . Bronchiectasis with acute exacerbation ([place]) 01/25/2020  . Mucopurulent chronic bronchitis ([place]) 01/02/2020  . Cough 01/02/2020  . Chronic cough 10/25/2019  . Pure hypertriglyceridemia 06/15/2019  . Routine general medical examination at a health care facility 11/09/2018  . Pulmonary fibrosis ([place]) 04/05/2013  . Renal insufficiency 01/02/2011  . ANEMIA, B12 DEFICIENCY 07/02/2010  . Type 2 diabetes, uncontrolled, with renal manifestation ([place]) 09/15/2008  . Allergic rhinitis 06/20/2008  . Dyslipidemia, goal LDL below 100 05/03/2008  .  Essential hypertension 05/03/2008  . GERD 05/03/2008  . BPH associated with nocturia 05/03/2008    Past Surgical History:  Procedure Laterality Date  . APPENDECTOMY  1953  . arm surgery     benign growth removed left arm  . CATARACT EXTRACTION     OD  . COLONOSCOPY W/ POLYPECTOMY     Dr Sharlett Iles  . [place]   x 2  . SKIN BIOPSY Right 2014   benign  . TONSILLECTOMY  1961  . UPPER GASTROINTESTINAL ENDOSCOPY     gastritis; Dr Sharlett Iles       Family History  Problem Relation Age of Onset  . Heart attack Father 96  . Alcohol abuse Father   . Diabetes Mother   . Heart attack Mother 56  . Dementia Mother   . Stomach cancer Maternal Uncle   . Cancer Maternal Aunt        ? primary  . Cancer Paternal Aunt        ? primary  . Cancer Paternal Uncle        X2 ; prostate& colon  . Heart disease Maternal Grandfather        ? MI  . Arthritis Paternal Grandfather   . Lung cancer Maternal Grandmother        smoked  . Stroke Neg Hx     Social History   Tobacco Use  .  Smoking status: Former Smoker    Packs/day: 1.00    Years: 3.00    Pack years: 3.00    Types: Cigarettes    Quit date: 12/22/1961    Years since quitting: 58.6  . Smokeless tobacco: Never Used  Vaping Use  . Vaping Use: Never used  Substance Use Topics  . Alcohol use: No    Alcohol/week: 0.0 standard drinks  . Drug use: No    Home Medications Prior to Admission medications   Medication Sig Start Date End Date Taking? Authorizing Provider  acetaminophen (TYLENOL) 500 MG tablet Take 500 mg by mouth every 6 (six) hours as needed.    [provider]  Ascorbic Acid (VITAMIN C) 100 MG tablet Take 100 mg by mouth daily.    [provider]  aspirin 81 MG tablet Take 1 tablet (81 mg total) by mouth daily. 11/09/18   Janith Lima, MD  atenolol (TENORMIN) 25 MG tablet TAKE 1/2 TABLET BY MOUTH EVERY MORNING. 05/22/20   Janith Lima, MD  cetirizine (ZYRTEC) 10 MG tablet  Take 10 mg by mouth daily.    [provider]  cyanocobalamin 2000 MCG tablet Take 1 tablet (2,000 mcg total) by mouth daily. 06/16/19   Janith Lima, MD  docusate sodium (COLACE) 100 MG capsule Take 100 mg by mouth 2 (two) times daily.    [provider]  famotidine (PEPCID) 20 MG tablet Take 20 mg by mouth at bedtime.    [provider]  fenofibrate (TRICOR) 145 MG tablet TAKE ONE TABLET BY MOUTH ONCE DAILY. 05/22/20   Janith Lima, MD  guaifenesin (ROBITUSSIN) 100 MG/5ML syrup Take 200 mg by mouth 3 (three) times daily as needed for cough.    [provider]  ipratropium-albuterol (DUONEB) 0.5-2.5 (3) MG/3ML SOLN Take 3 mLs by nebulization every 6 (six) hours as needed (TWICE DAILY AS NEEDED). 07/02/20   Rigoberto Noel, MD  LIVALO 2 MG TABS TAKE 1 TABLET BY MOUTH ONCE A DAY. 02/17/20   Janith Lima, MD  losartan (COZAAR) 100 MG tablet Take 50 mg by mouth daily.    [provider]  magnesium citrate SOLN Take 296 mLs (1 Bottle total) by mouth once for 1 dose. 07/14/20 07/14/20  Orpah Greek, MD  Multiple Vitamins-Minerals (ICAPS AREDS FORMULA PO) Take 1 tablet by mouth daily.    [provider]  Multiple Vitamins-Minerals (MULTIVITAMIN WITH MINERALS) tablet Take 1 tablet by mouth daily.    [provider]  polyethylene glycol (MIRALAX / GLYCOLAX) 17 g packet Take 17 g by mouth 2 (two) times daily as needed for moderate constipation. 07/14/20   Sher Shampine, Gwenyth Allegra, MD  predniSONE (DELTASONE) 10 MG tablet Take 4 tabs daily with food x 4 days, then 3 tabs daily x 4 days, then 2 tabs daily x 4 days, then 1 tab daily x4 days then STOP. 07/02/20   Rigoberto Noel, MD  VITAMIN D PO Take 1 tablet by mouth daily.    [provider]  atenolol (TENORMIN) 25 MG tablet TAKE 1/2 TABLET BY MOUTH EVERY MORNING. 12/21/19   Janith Lima, MD  fenofibrate (TRICOR) 145 MG tablet TAKE ONE TABLET BY MOUTH ONCE DAILY. 08/16/19   Janith Lima, MD  JANUMET XR [phone] MG TB24 TAKE 1 TABLET BY MOUTH ONCE A DAY. 08/30/19   Janith Lima, MD  LIVALO 2 MG TABS TAKE 1 TABLET BY MOUTH ONCE A DAY. 11/15/19  Janith Lima, MD    Allergies    Patient has no known allergies.  Review of Systems   Review of Systems  Gastrointestinal: Positive for constipation.  All other systems reviewed and are negative.   Physical Exam Updated Vital Signs BP 101/68 (BP Location: Left Arm)   Pulse 102   Temp 98 F (36.7 C) (Oral)   Resp 20   Ht 5\' 10"  (1.778 m)   Wt 69.7 kg   SpO2 96%   BMI 22.04 kg/m   Physical Exam Vitals and nursing note reviewed.  Constitutional:      General: He is not in acute distress.    Appearance: Normal appearance. He is well-developed.  HENT:     Head: Normocephalic and atraumatic.     Right Ear: Hearing normal.     Left Ear: Hearing normal.     Nose: Nose normal.  Eyes:     Conjunctiva/sclera: Conjunctivae normal.     Pupils: Pupils are equal, round, and reactive to light.  Cardiovascular:     Rate and Rhythm: Regular rhythm.     Heart sounds: S1 normal and S2 normal. No murmur heard.  No friction rub. No gallop.   Pulmonary:     Effort: Pulmonary effort is normal. No respiratory distress.     Breath sounds: Normal breath sounds.  Chest:     Chest wall: No tenderness.  Abdominal:     General: Bowel sounds are normal.     Palpations: Abdomen is soft.     Tenderness: There is no abdominal tenderness. There is no guarding or rebound. Negative signs include Murphy's sign and McBurney's sign.     Hernia: No hernia is present.  Genitourinary:    Comments: Large collection of soft, claylike stool in rectum Musculoskeletal:        General: Normal range of motion.     Cervical back: Normal range of motion and neck supple.  Skin:    General: Skin is warm and dry.     Findings: No rash.  Neurological:     Mental Status: He is alert and oriented to person, place, and time.     GCS: GCS eye  subscore is 4. GCS verbal subscore is 5. GCS motor subscore is 6.     Cranial Nerves: No cranial nerve deficit.     Sensory: No sensory deficit.     Coordination: Coordination normal.  Psychiatric:        Speech: Speech normal.        Behavior: Behavior normal.        Thought Content: Thought content normal.     ED Results / Procedures / Treatments   Labs (all labs ordered are listed, but only abnormal results are displayed) Labs Reviewed  CBC WITH DIFFERENTIAL/PLATELET - Abnormal; Notable for the following components:      Result Value   WBC 13.1 (*)    Neutro Abs 10.8 (*)    All other components within normal limits  BASIC METABOLIC PANEL - Abnormal; Notable for the following components:   Sodium 133 (*)    Glucose, Bld 204 (*)    BUN 36 (*)    Creatinine, Ser 1.42 (*)    GFR calc non Af Amer 46 (*)    GFR calc Af Amer 53 (*)    All other components within normal limits  POC OCCULT BLOOD, ED - Abnormal; Notable for the following components:   Fecal Occult Bld POSITIVE (*)    All  other components within normal limits    EKG None  Radiology DG ABD ACUTE 2+V W 1V CHEST  Result Date: 07/14/2020 CLINICAL DATA:  Constipation EXAM: DG ABDOMEN ACUTE W/ 1V CHEST COMPARISON:  None. FINDINGS: Chronic interstitial opacities within both lungs compatible with pulmonary fibrosis. No free intraperitoneal air. Large stool ball in the rectum but otherwise unremarkable bowel gas pattern. IMPRESSION: 1. Large stool ball in the rectum. No free intraperitoneal air. 2. Pulmonary fibrosis. Electronically Signed   By: Ulyses Jarred M.D.   On: 07/14/2020 02:04    Procedures Procedures (including critical care time)  Medications Ordered in ED Medications  sodium chloride 0.9 % bolus 500 mL (0 mLs Intravenous Stopped 07/14/20 0208)    ED Course  I have reviewed the triage vital signs and the nursing notes.  Pertinent labs & imaging results that were available during my care of the patient  were reviewed by me and considered in my medical decision making (see chart for details).    MDM Rules/Calculators/A&P                          Patient presents to the emergency department for evaluation of constipation.  He has not had a bowel movement in 5 days.  Patient having difficulty trying to have a bowel movement because his pulmonary fibrosis limits the amount of straining he can do.  X-ray does not show any acute abnormality other than large stool ball in rectal vault.  This was confirmed by digital exam.  Disimpaction not possible because stool is soft and claylike.  He did have a small bowel movement after enema here in the ER.  We will continue bowel regimen with 1 dose of magnesium citrate followed by MiraLAX.  Addendum: Patient reports that he is having trouble managing things at home.  Face-to-face order placed and consult to case management placed as well.  Patient having decreased ability to perform ADLs and leave the house secondary to his worsening pulmonary fibrosis.  Final Clinical Impression(s) / ED Diagnoses Final diagnoses:  Slow transit constipation    Rx / DC Orders ED Discharge Orders         Ordered    magnesium citrate SOLN   Once     Discontinue  Reprint     07/14/20 0215    polyethylene glycol (MIRALAX / GLYCOLAX) 17 g packet  2 times daily PRN     Discontinue  Reprint     07/14/20 0215           Orpah Greek, MD 07/14/20 0215    Orpah Greek, MD 07/14/20 [phone]

## 2020-07-14 NOTE — ED Triage Notes (Signed)
Pt here due to constipation. LBM 07/08/20. Pt also concerned that he is dehydrated.

## 2020-07-22 DIAGNOSIS — I129 Hypertensive chronic kidney disease with stage 1 through stage 4 chronic kidney disease, or unspecified chronic kidney disease: Secondary | ICD-10-CM | POA: Diagnosis not present

## 2020-07-22 DIAGNOSIS — N1832 Chronic kidney disease, stage 3b: Secondary | ICD-10-CM | POA: Diagnosis not present

## 2020-07-22 DIAGNOSIS — J84112 Idiopathic pulmonary fibrosis: Secondary | ICD-10-CM | POA: Diagnosis not present

## 2020-07-22 DIAGNOSIS — J411 Mucopurulent chronic bronchitis: Secondary | ICD-10-CM | POA: Diagnosis not present

## 2020-07-22 DIAGNOSIS — J9611 Chronic respiratory failure with hypoxia: Secondary | ICD-10-CM | POA: Diagnosis not present

## 2020-07-22 DIAGNOSIS — J479 Bronchiectasis, uncomplicated: Secondary | ICD-10-CM | POA: Diagnosis not present

## 2020-07-22 DIAGNOSIS — E1122 Type 2 diabetes mellitus with diabetic chronic kidney disease: Secondary | ICD-10-CM | POA: Diagnosis not present

## 2020-07-22 DIAGNOSIS — E11319 Type 2 diabetes mellitus with unspecified diabetic retinopathy without macular edema: Secondary | ICD-10-CM | POA: Diagnosis not present

## 2020-07-22 DIAGNOSIS — D631 Anemia in chronic kidney disease: Secondary | ICD-10-CM | POA: Diagnosis not present

## 2020-07-23 ENCOUNTER — Telehealth: Payer: Self-pay

## 2020-07-23 NOTE — Telephone Encounter (Signed)
Sent to Dr. John. 

## 2020-07-23 NOTE — Telephone Encounter (Signed)
Ok for verbal 

## 2020-07-23 NOTE — Telephone Encounter (Signed)
New message    [facility] needs verbal order  PT - twice a week for one week   Then one time a week for eight weeks

## 2020-07-25 NOTE — Telephone Encounter (Signed)
LDVM for Nathan Howard at [place] with verbal Ok for PT - twice a week for one week   Then one time a week for eight weeks

## 2020-07-30 DIAGNOSIS — I129 Hypertensive chronic kidney disease with stage 1 through stage 4 chronic kidney disease, or unspecified chronic kidney disease: Secondary | ICD-10-CM | POA: Diagnosis not present

## 2020-07-30 DIAGNOSIS — J84112 Idiopathic pulmonary fibrosis: Secondary | ICD-10-CM | POA: Diagnosis not present

## 2020-07-30 DIAGNOSIS — E11319 Type 2 diabetes mellitus with unspecified diabetic retinopathy without macular edema: Secondary | ICD-10-CM | POA: Diagnosis not present

## 2020-07-30 DIAGNOSIS — J411 Mucopurulent chronic bronchitis: Secondary | ICD-10-CM | POA: Diagnosis not present

## 2020-07-30 DIAGNOSIS — N1832 Chronic kidney disease, stage 3b: Secondary | ICD-10-CM | POA: Diagnosis not present

## 2020-07-30 DIAGNOSIS — D631 Anemia in chronic kidney disease: Secondary | ICD-10-CM | POA: Diagnosis not present

## 2020-07-30 DIAGNOSIS — J9611 Chronic respiratory failure with hypoxia: Secondary | ICD-10-CM | POA: Diagnosis not present

## 2020-07-30 DIAGNOSIS — E1122 Type 2 diabetes mellitus with diabetic chronic kidney disease: Secondary | ICD-10-CM | POA: Diagnosis not present

## 2020-07-30 DIAGNOSIS — J479 Bronchiectasis, uncomplicated: Secondary | ICD-10-CM | POA: Diagnosis not present

## 2020-07-31 ENCOUNTER — Telehealth: Payer: Self-pay | Admitting: Internal Medicine

## 2020-07-31 NOTE — Telephone Encounter (Signed)
New message:   Pt's care giver is refusing OT for the pt. Please advise.

## 2020-08-01 ENCOUNTER — Encounter: Payer: Self-pay | Admitting: Internal Medicine

## 2020-08-01 ENCOUNTER — Other Ambulatory Visit: Payer: Self-pay

## 2020-08-01 ENCOUNTER — Ambulatory Visit: Payer: Medicare PPO | Admitting: Internal Medicine

## 2020-08-01 VITALS — BP 112/62 | HR 71 | Temp 97.6°F | Ht 70.0 in | Wt 146.6 lb

## 2020-08-01 DIAGNOSIS — I10 Essential (primary) hypertension: Secondary | ICD-10-CM

## 2020-08-01 DIAGNOSIS — J8410 Pulmonary fibrosis, unspecified: Secondary | ICD-10-CM

## 2020-08-01 DIAGNOSIS — J309 Allergic rhinitis, unspecified: Secondary | ICD-10-CM | POA: Diagnosis not present

## 2020-08-01 DIAGNOSIS — J9611 Chronic respiratory failure with hypoxia: Secondary | ICD-10-CM | POA: Diagnosis not present

## 2020-08-01 MED ORDER — PREDNISONE 10 MG PO TABS: ORAL_TABLET | ORAL | 2 refills | Status: AC

## 2020-08-01 NOTE — Progress Notes (Signed)
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 Pulmonary Consult    Referred by Dr. Unice Cobble. Pt states having SOB since Dec 2015 after developing URI. He gets SOB with doing yard work and with singing. He states that he also has some PND and occ cough with white to clear sputum.   abruptly ill week before xmas, sense of acute sinus congestion, sore throat chest burning during severe coughing fits assoc with infected tooth rx with clindamycin x 40 days and tooth better, all symptoms improved except now doe x flight of steps or yardwork or fast walking. rec Change prilosec to omeprazole 40 mg Take 30-60 min before first meal of the day and Pepcid ac 20 at bedtime until return GERD diet   12/27/2015  f/u ov/Jaydrien Wassenaar re: PF/ no change ex tol Chief Complaint  Patient presents with  . Follow-up    CXR done today. Breathing is unchanged. No new co's today.   rec No change rx = ppi qam/ h2hs    06/25/2016  f/u ov/Jeramey Lanuza re: PF/ maint on prilosec but worried about press so stopped pepcid Chief Complaint  Patient presents with  . Follow-up    PFT done today. Breathing is overall doing well.    able to work in the yard ok, some trouble bending over at waist, no limiting doe rec Continue prilosec otc 5m  Take 30-60 min before first meal of the day and Pepcid ac (famotidine) 20 mg one @  bedtime        05/12/2018  f/u ov/Ramah Langhans re:  PF with high ESR  Chief Complaint  Patient presents with  . Follow-up    follow up PFT. breathing unchanged.  Dyspnea:   Yard work / house work all fine Cough: worse than usual since winter 2019 day > noct and worse with ex/ deep breath  Sleep: ok one pillow  rec Prednisone 10 mg  2 daily until better then 1 daily x one week and off> never took it We will apply for OFEV initiation  > pt decided against after reviewing info   06/28/2018  f/u ov/Vella Colquitt re: PF  Chief Complaint  Patient presents with  . Follow-up    Breathing has improved some since the last visit. He has not started on prednisone yet.     Dyspnea:  Worse bending over picking up debris on ground Cough: rarely /still using cough drops   SABA use: none 02: none  rec Continue Try prilosec otc 227m Take 30-60 min before first meal of the day and Pepcid ac (famotidine) 20 mg one @  bedtime until return  GERD  Please remember to go to the lab department downstairs in the basement  for your tests - we will call you with the results when they are available. Please schedule a follow up visit in 2 months but call sooner if needed with pfts on return    - add: esr now double baseline to 66 so  rec pred 20 mg ceiling and floor of 0   09/01/2018  f/u ov/Breeonna Mone re:  ? IPF  Chief Complaint  Patient presents with  . Follow-up    PFT results, feels like he has rattle in his chest, SHOB  Dyspnea:  Yard work Cough: w/in a week of taking pred 20 mg / day cough resolved and taper off by around July 22 2018 s flare  Sleeping: flat bed/ one pillow SABA use: none 02:  None   rec If breathing/ cough worsen restart prednisone 20 mg daily and when better reduce to 10 mg daily as your new "floor" but be sure to monitor your sugars if you need to restart prednisone  Please schedule a follow up office visit in 6 weeks, call sooner if needed with 6 min walk    11/29/2018  f/u ov/Aesha Agrawal re:  Chief Complaint  Patient presents with  . Follow-up    Cough has improved some. Breathing has also improved.    Dyspnea:  No change ex tol, admits not checking sats as rec Cough:  none Sleeping: fine SABA use: none 02:  None   rec Measure your oxygen saturation with the same activity as usual once a week - call if downward trend any lower than 88%  Please schedule a follow up visit in 4  months but call sooner if needed with pfts on return     06/22/2019  f/u ov/Abegail Kloeppel re:  PF on no rx other than gerd   Dyspnea:  Minimum worse doe but not checking sats  Cough: dry / usually worse in spring  Sleeping: ok at hs on one big pillow SABA use: none  02: none  rec No change medications Monitor your 02 saturations while on routine walk and call if trending down    10/24/2019  f/u ov/Xxavier Noon re: PF/ cough  Dyspnea: walking up from street 10 degrees sev hundred feet  does not check 02 Cough:  Worse when sob / never productive  Sleeping: ok flat, big pillow SABA use: inhalers don't help cough or sob  rec For cough > delsym 2 tsp every 12 hours as needed  Or robitussin but not both  For drainage / throat tickle try either zyrtec one at bedtime or  CHLORPHENIRAMINE  4 mg  (Chlortab 27m at [facility] [employer] be easiest to find in the green box)  take one every 4 hours as needed - available over the counter- may cause drowsiness so start with just a bedtime dose or two and see how you tolerate it before trying in daytime  GERD  Diet  Please schedule a follow up visit in 3 months but call sooner if needed  - consider gabapentin 100 tid if not responding    01/24/2020  f/u ov/Daisha Filosa re: pf/ cough likely form bronchiectatic component  Chief Complaint  Patient presents with  . Follow-up    Increased cough with dark green sputum over the past month. He also reports increased SOB.   Dyspnea:  Not limited by breathing from desired activities  Unless coughing  Cough: severe x one month with purulent sputum esp in am  Sleeping: wedge pillow  SABA use: none  02: none  Assoc nasal congesiton but min discolored mucus rec Change prilosec to Pantoprazole (protonix) 40 mg   Take   30-60 min before first meal of the day and Pepcid (famotidine)  20 mg one after supper until return to office - this is the best way to tell  whether stomach acid is contributing to your problem.   GERD (REFLUX) Augmentin 875 mg take one pill twice daily  X 10 days Prednisone 10 mg take  4 each am x 2 days,   2 each am x 2 days,  1 each am x 2 days and  add gabapentin 100 mg three times daily   For cough use robissin DM as needed  Please schedule a follow up office visit in 4 weeks, call sooner if needed with all medications /inhalers/ solutions in hand so we can verify exactly what you are taking. This includes all medications from all doctors and over the [place] separate them into two bags:  the ones you take automatically, no matter what, vs the ones you take just when you feel you need them "BAG #2 is UP TO YOU"  - this will really help [place] help you take your medications more effectively.     02/21/2020  f/u ov/Merlie Noga re: pf /bronchiectasis / improved p augmentin/pred / brought meds separated into Dr Ronnald Ramp vs Dr Melvyn Novas vs otc bags  Chief Complaint  Patient presents with  . Follow-up    4wk f/u. Still has a semi-productive cough at times. Has not noticed a difference in SOB.   Dyspnea:  Mailbox and back ok / not checking  sats  Cough: some better - decided against taking gabapentin after read about side effects Sleeping: fine flat SABA use: none  02: none  rec  Make sure you check your oxygen saturations at highest level of activity to be sure it stays over 90%   Augmentin 875 mg take one pill twice daily  X 10 days - take at breakfast and supper with large glass of water Take Prednisone 4 for three days 3 for three days 2 for three days 1 for three days and stop  For cough use robissin DM as needed  Or Delsym but not both  Please schedule a follow up office visit in 4 weeks, call sooner if needed with all medications    05/07/2020  f/u ov/Richrd Kuzniar re: pf/ worse off gerd rx / never too  pred or augmentin  Chief Complaint  Patient presents with  . Acute Visit    Increased SOB and cough-progressivley getting worse since the last visit.   Dyspnea:  25-50 ft x since April , never took prednisone as worried about shots for covid 19 Cough: daily and worse dry cough with activity  Sleeping: ok x 30 degrees  SABA use: none  02: none  Appetite poor, slt nausea some burning/ some constipation "it's a digestive problem, that's all I can tell you"  rec Pepcid (famtotidine)  20 mg one twice daily for now after breakfast and supper  Prednisone 10 mg take  4 each am x 2 days,   2 each am x 2 days,  1 each am x 2 days and stop  For now no 02 needed at rest or sleeping but try 3lpm with activity Make sure you check your oxygen saturations at highest level of activity to be sure it stays over 90%  Please schedule a follow up office visit in 2 weeks, sooner if needed  with all medications   05/22/2020  f/u ov/Ecko Beasley re:  Pf/ unexplained wt loss  Chief Complaint  Patient presents with  . Follow-up    Breathing is some better and he is coughing less.   Dyspnea:  Worked outside for a couple of hours s 02 off 02  p on prednisone  Cough: less / sporadic pattern/ assoc with watery pnds despite zyrtec and never tried h1  Sleeping: bed is flat wedge pillow 30 degrees sleeps fine  SABA use:  None  02: only uses 02 after exertion  rec Continue automatic:  pepcid (famtotidine)  20 mg one twice daily for now after breakfast and supper  - supplement with maalox plus as needed Eat richer food fried chicken /burgers/fries / mashed potatoes with gravy  For now no 02 needed at rest or sleeping  Best cough medication is delsym 2 tsp every 12 hours as needed and not robitussin  Instead of zyrtec:  For drainage / throat tickle try take CHLORPHENIRAMINE  4 mg    Make sure you check your oxygen saturations at highest level of activity to be sure it stays over 90% and adjust upward to maintain this level if  needed but remember to turn it back to previous settings when you stop (to conserve your supply).  See your PCP re weight loss which may be diabetes related    07/02/20 acute ov [facility] rec You are having a flareup of pulmonary fibrosis with worsening oxygen levels Trial of 4 L oxygen during ambulation, okay to keep 2L at rest. Prednisone 10 mg tabs Take 4 tabs  daily with food x 4 days, then 3 tabs daily x 4 days, then 2 tabs daily x 4 days, then 1 tab daily x4 days then stop. #40 Prescription for nebulizer DuoNebs twice daily as needed # 60 x 1 refill We discussed your living will and healthcare POA paperwork    08/01/2020  Extended  f/u ov/[facility] office/Mckinsey Keagle re: PF ? Steroids resp component/ worse off pred  Chief Complaint  Patient presents with  . Follow-up    shortness of breath with exertion   Dyspnea:  At rest/ better on prednisone / never took duoneb due to fast resting heart rate  Cough: dry hack Sleeping: wedge 30 degrees  SABA use: none  02: 4lpm 24 / 7 does not titrate  Considering hospice   No obvious day to day or daytime variability or assoc excess/ purulent sputum or mucus plugs or hemoptysis or cp or chest tightness, subjective wheeze or overt  hb symptoms.   Sleeping  without nocturnal  or early am exacerbation  of respiratory  c/o's or need for noct saba. Also denies any obvious fluctuation of symptoms with weather or environmental changes or other aggravating or alleviating factors except as outlined above   No unusual exposure hx or h/o childhood pna/ asthma or knowledge of premature birth.  Current Allergies, Complete Past Medical History, Past Surgical History, Family History, and Social History were reviewed in [employer] record.  ROS  The following are not active complaints unless bolded Hoarseness, sore throat, dysphagia, dental problems, itching, sneezing,  nasal congestion or discharge of excess mucus or purulent secretions, ear  ache,   fever, chills, sweats, unintended wt loss or wt gain, classically pleuritic or exertional cp,  orthopnea pnd or arm/hand swelling  or leg swelling, presyncope, palpitations, abdominal pain, anorexia, nausea, vomiting, diarrhea  or change in bowel habits or change in bladder habits, change in stools or change in urine, dysuria, hematuria,  rash, arthralgias, visual complaints, headache, numbness, weakness or ataxia or problems with walking or coordination,  change in mood or  memory.        Current Meds  Medication Sig  . acetaminophen (TYLENOL) 500 MG tablet Take 500 mg  by mouth every 6 (six) hours as needed.  Marland Kitchen aspirin 81 MG tablet Take 1 tablet (81 mg total) by mouth daily.  Marland Kitchen atenolol (TENORMIN) 25 MG tablet TAKE 1/2 TABLET BY MOUTH EVERY MORNING.  . cetirizine (ZYRTEC) 10 MG tablet Take 10 mg by mouth daily.  Marland Kitchen dextromethorphan (DELSYM) 30 MG/5ML liquid Take by mouth as needed for cough.  . docusate sodium (COLACE) 100 MG capsule Take 100 mg by mouth 2 (two) times daily.  . famotidine (PEPCID) 20 MG tablet Take 20 mg by mouth daily as needed.   . fenofibrate (TRICOR) 145 MG tablet TAKE ONE TABLET BY MOUTH ONCE DAILY.  Marland Kitchen guaifenesin (ROBITUSSIN) 100 MG/5ML syrup Take 200 mg by mouth 3 (three) times daily as needed for cough.  Marland Kitchen LIVALO 2 MG TABS TAKE 1 TABLET BY MOUTH ONCE A DAY.  . Multiple Vitamins-Minerals (ICAPS AREDS FORMULA PO) Take 1 tablet by mouth daily.  . Multiple Vitamins-Minerals (MULTIVITAMIN WITH MINERALS) tablet Take 1 tablet by mouth daily.  . polyethylene glycol (MIRALAX / GLYCOLAX) 17 g packet Take 17 g by mouth 2 (two) times daily as needed for moderate constipation.                   Objective:   Physical Exam  08/01/2020    146  05/22/2020      158  05/07/2020    164  02/21/2020      176  01/24/2020       176  10/24/2019     184  06/22/2019       185  05/16/2015     209 > 06/28/2015    213  > 12/27/2015   211 > 06/25/2016  206 > 12/26/2016  203> 06/26/2017  211 >    03/29/2018 199 >  04/26/2018  201 > 05/12/2018    201 > 06/28/2018   201 > 09/01/2018    195 > 11/29/2018  212     02/26/15 213 lb (96.616 kg)  02/12/15 213 lb 12 oz (96.956 kg)      Vital signs reviewed  08/01/2020  - Note at rest 02 sats  99% on 4lpm    chronically ill thin wm who typically fails answer  questions asked in a straightforward manner, tending to go off on tangents  And then get frustrated that he's not being understood   HEENT : pt wearing mask not removed for exam due to covid -19 concerns.    NECK :  without JVD/Nodes/TM/ nl carotid upstrokes bilaterally   LUNGS: no acc muscle use,  Nl contour chest insp crackles  bilaterally without cough on insp or exp maneuvers   CV:  RRR  (no ectopy) no s3 or murmur or increase in P2, and no edema   ABD:  soft and nontender with nl inspiratory excursion in the supine position. No bruits or organomegaly appreciated, bowel sounds nl  MS:  Nl gait/ ext warm without deformities, calf tenderness, cyanosis or clubbing No obvious joint restrictions   SKIN: warm and dry without lesions    NEURO:  alert, approp, nl sensorium with  no motor or cerebellar deficits apparent.        ekg 08/01/2020   NSR / min non-specific ST T w changes/ no ischemia               Assessment & Plan:

## 2020-08-01 NOTE — Assessment & Plan Note (Signed)
Allergy profile 09/01/2018 >  Eos 0.3 /  IgE  19 RAST neg   Try @ hs 1st gen H1 blockers per guidelines  And use zyrtec in am pm

## 2020-08-01 NOTE — Assessment & Plan Note (Signed)
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 -Smoking history encompassed less than 5 years. He's had no significant occupational exposures - 02/26/2015  Walked RA x 3 laps @ 185 ft each stopped due to  End of study , nl pace, no desats - 02/26/2015 collagen vasc screen sent > neg with esr 28  - 02/26/2015 rec max gerd rx/ diet and > improved 05/16/15  - 06/28/2015  VC 3.65 (85%) no obst and dlco 56 corrects  to 81  - 06/28/2015  Walked RA x 3 laps @ 185 ft each stopped due to End of study, nl pace, no sob or desat  - PFT's  06/25/2016   VC  3.85  (91%)  DLCO  53/51 % corrects to 72 % for alv volume  - 12/26/2016  Walked RA x 3 laps @ 185 ft each stopped due to  End of study, nl pace, no sob /sats 91% at end   - PFTs  06/26/2017   VC  3.33 (79%)   DLCO  62/59  %  Corrects to 93%    - 12/28/2017  6 m walk  =  336 sats ok   - HRCT chest 03/25/18 :  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 - 04/26/2018  Walked RA x 3 laps @ 185 ft each stopped due to  End of study, nl pace, sat 92% at end, min sob  - PFT's  05/12/2018  VC 3.08 (74%)  w obst DLCO  43 % corrects to 77  % for alv volume   - ESR 49  04/26/17 so 05/12/2018  rec pred 20 mg daily until cough better then 10 mg x one week and stop > never took it  - 05/12/2018 initiate ofev paperwork > declined  - 06/28/2018  Walked RA x 3 laps @ 185 ft each stopped due to  End of study, nl pace, no sob but  desat  To as low as 88%   - ESR 06/28/2018 up to 66 rec pred 20 mg per day until breathing better then taper off> cough resolved   - PFT's  09/01/2018  FVC  2.93(70%)   p no % improvement from saba p no prior to study  / not able to do dlco    - ESR 51 off prednisone x 2 weeks  - 09/01/2018  Walked RA x 3 laps @ 185 ft each stopped due to  End of study, slow pace, no sob and sats 89% at end   - 11/29/18:  39m   = 408 meters, lowest sat was  88%  - 06/22/2019   Walked RA  2 laps @  approx 2560feach @ avg pace  stopped due to  End of study, sats 88%, min sob  - 10/24/2019   Walked RA  2 laps @  approx 25062fach @ avg pace  stopped due to  End of study, sats 87% no sob - 05/07/2020 started on amb 02 see sep a/p   - 05/07/20  pred x 6 days > much better clinically to point where worked in yard for 2 h s 02  - 08/01/2020 pred 40 mg ceiling / 10 mg floor   The goal with a chronic steroid dependent illness is always arriving at the lowest effective dose that controls the disease/symptoms and not accepting a set "formula" which is based on statistics or guidelines that don't  always take into account patient  variability or the natural hx of the dz in every individual patient, which may well vary over time.  For now therefore I recommend the patient maintain  40 mg daily until better then taper to 10 mg   >> emphasized diet/ cbg's each am and call PCP if > 150 fasting (may need lantus to prevent worsening hyperglycemia on steroids  Discussed in detail all the  indications, usual  risks and alternatives  relative to the benefits with patient who agrees to proceed with Rx as outlined.

## 2020-08-01 NOTE — Patient Instructions (Addendum)
Adjust 02 to keep saturations above 90% at all times but there is no benefit to driving it higher as this may irritate the nose  Prednisone 10 mg x 4 with breakfast until breathing better then x 2 for a week then 1 daily   While on prednisone you will need to monitor your sugar closely before breakfast daily and notify your PCP if the fasting is over 150  - eating more meats/ vegetables and less starches sweets should help   Let me know when you decide re hospice vs [facility] so I can order your water chamber for your 02  For drainage / throat tickle try take CHLORPHENIRAMINE  4 mg  (Chlortab 4mg   at [employer] should be easiest to find in the green box)  take one every 4 hours as needed - available over the counter- may cause drowsiness so start with just a bedtime dose or two and see how you tolerate it before trying in daytime    Please schedule a follow up office visit in 6 weeks, call sooner if needed

## 2020-08-01 NOTE — Assessment & Plan Note (Signed)
05/07/2020   Walked RA  2 laps @ approx 223ft with sats at rest at 95% and required 3lpm walking - Vital signs reviewed  05/22/2020  - Note at rest 02 sats  93% on RA    -  08/01/2020   Walked 4lpm cont approx   300 ft  @ slow  pace  stopped due to  Sob with sats still 93%    Discussed importance of distinguishing sob from hypoxemia which he needs to treat by titrating 02 to maintaint > 90%  Also extensive discussion of whether to continue HHN per [facility] or accept hospice with different nurses/ DME  Will need humdified 02 either way but asked him to decide first before asking new dme to provide.         Each maintenance medication was reviewed in detail including emphasizing most importantly the difference between maintenance and prns and under what circumstances the prns are to be triggered using an action plan format where appropriate.  Total time for H and P, chart review, counseling, teaching device and generating customized AVS unique to this office visit / charting =51 min

## 2020-08-06 NOTE — Telephone Encounter (Signed)
[facility] called and stated they no long will be following the patient, He has been picked up by hospice in [facility].

## 2020-09-25 ENCOUNTER — Ambulatory Visit: Payer: Medicare PPO | Admitting: Internal Medicine

## 2020-11-21 DEATH — deceased

## 2021-07-29 ENCOUNTER — Telehealth: Payer: Self-pay | Admitting: Internal Medicine

## 2021-07-29 NOTE — Telephone Encounter (Signed)
LVM for pt to rtn my call to schedule AWV with [facility]. Please schedule this appt if pt calls the office or comes in.
